# Patient Record
Sex: Female | Born: 1995 | Race: Black or African American | Hispanic: No | Marital: Married | Smoking: Former smoker
Health system: Southern US, Community
[De-identification: ages and names within clinical notes are randomized; demographics above are authoritative.]

## PROBLEM LIST (undated history)

## (undated) ENCOUNTER — Inpatient Hospital Stay (HOSPITAL_COMMUNITY): Payer: Self-pay

## (undated) DIAGNOSIS — D649 Anemia, unspecified: Secondary | ICD-10-CM

## (undated) DIAGNOSIS — Z202 Contact with and (suspected) exposure to infections with a predominantly sexual mode of transmission: Secondary | ICD-10-CM

## (undated) DIAGNOSIS — B9689 Other specified bacterial agents as the cause of diseases classified elsewhere: Secondary | ICD-10-CM

## (undated) DIAGNOSIS — N760 Acute vaginitis: Secondary | ICD-10-CM

## (undated) DIAGNOSIS — K219 Gastro-esophageal reflux disease without esophagitis: Secondary | ICD-10-CM

## (undated) DIAGNOSIS — R519 Headache, unspecified: Secondary | ICD-10-CM

## (undated) DIAGNOSIS — F419 Anxiety disorder, unspecified: Secondary | ICD-10-CM

## (undated) HISTORY — PX: HERNIA REPAIR: SHX51

---
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
## 2013-01-02 NOTE — Progress Notes (Signed)
Post discharge chart review completed.  

## 2013-01-04 ENCOUNTER — Ambulatory Visit (HOSPITAL_COMMUNITY)
Admit: 2013-01-04 | Discharge: 2013-01-04 | Disposition: A | Discharge status: Home / Self Care | Payer: Medicaid Other | Attending: Obstetrics and Gynecology | Admitting: Obstetrics and Gynecology

## 2013-01-04 NOTE — Lactation Note (Signed)
Adult Lactation Consultation Outpatient Visit Note  Patient Name: Lori Lam    BABY: JAMAR HAWLEY-Mcnease Date of Birth: Mar 21, 1996                         DOB: [birth year] Gestational Age at Delivery: 85.4         BIRTH WEIGHT: 6-12.4 Type of Delivery: NVD                          DISCHARGE WEIGHT: 6-7.9                                                               WEIGHT TODAY: 7-1.9 Breastfeeding History: Frequency of Breastfeeding: EVERY 1-3 HOURS Length of Feeding: 20-40 MINUTES Voids: 6+ Stools: 6+ YELLOW  Supplementing / Method:NONE Pumping:  Type of Pump:MANUAL   Frequency:1-2 TIMES PER DAY FOR COMFORT ONLY  Volume:  2 OZ  Comments:    Consultation Evaluation:  Mom and 8 day old newborn here for follow up feeding assist.  Mom was seen 12/31/12 in MAU for engorgement and sore nipples.  Engorgement is resolved.  Both breasts full but soft today.  Right nipple is cracked, left nipple intact.  Mom c/o pain on right side with feedings.  Assisted with 24 mm nipple shield on right breast due to intensity of pain.  Baby latched deeply and nursed actively with audible gulping.  Nipple shield full of milk when baby came off.  Reviewed basics and answered questions.  Mom will call us if any further concerns.  Initial Feeding Assessment: Pre-feed Weight: Post-feed Weight: Amount Transferred: Comments:  Additional Feeding Assessment: Pre-feed Weight: Post-feed Weight: Amount Transferred: Comments:  Additional Feeding Assessment: Pre-feed Weight: Post-feed Weight: Amount Transferred: Comments:  Total Breast milk Transferred this Visit:  Total Supplement Given:   Additional Interventions:   Follow-Up  WILL CALL PRN: PEDIATRICIAN 01/15/13      Hansel Feinstein 01/04/2013, 10:09 AM

## 2013-07-08 ENCOUNTER — Encounter (HOSPITAL_COMMUNITY): Payer: Self-pay | Admitting: Emergency Medicine

## 2013-07-08 ENCOUNTER — Emergency Department (HOSPITAL_COMMUNITY)
Admission: EM | Admit: 2013-07-08 | Discharge: 2013-07-08 | Disposition: A | Discharge status: Home / Self Care | Payer: Medicaid Other | Attending: Emergency Medicine | Admitting: Emergency Medicine

## 2013-07-08 DIAGNOSIS — Z79899 Other long term (current) drug therapy: Secondary | ICD-10-CM | POA: Insufficient documentation

## 2013-07-08 DIAGNOSIS — H5789 Other specified disorders of eye and adnexa: Secondary | ICD-10-CM | POA: Insufficient documentation

## 2013-07-08 DIAGNOSIS — H109 Unspecified conjunctivitis: Secondary | ICD-10-CM

## 2013-07-08 DIAGNOSIS — J45909 Unspecified asthma, uncomplicated: Secondary | ICD-10-CM | POA: Insufficient documentation

## 2013-07-08 DIAGNOSIS — R059 Cough, unspecified: Secondary | ICD-10-CM | POA: Insufficient documentation

## 2013-07-08 DIAGNOSIS — Z87891 Personal history of nicotine dependence: Secondary | ICD-10-CM | POA: Insufficient documentation

## 2013-07-08 DIAGNOSIS — J3489 Other specified disorders of nose and nasal sinuses: Secondary | ICD-10-CM | POA: Insufficient documentation

## 2013-07-08 DIAGNOSIS — R05 Cough: Secondary | ICD-10-CM | POA: Insufficient documentation

## 2013-07-08 MED ORDER — POLYMYXIN B-TRIMETHOPRIM 10000-0.1 UNIT/ML-% OP SOLN: 1.0000 [drp] | OPHTHALMIC | Status: DC

## 2013-07-08 NOTE — Discharge Instructions (Signed)

## 2013-07-08 NOTE — ED Provider Notes (Signed)
CSN: [id number]     Arrival date & time 07/08/13  2244 History   First MD Initiated Contact with Patient 07/08/13 2303     Chief Complaint  Patient presents with  . Conjunctivitis   HPI  History provided by the patient. Patient is 18 year old female with no significant PMH presents with concerns of pinkeye. She also presents with her young infant daughter with similar symptoms. She states over the past week or more she was having irritations to her right eye with some redness and occasional drainage. She does state that it is finally improved much better. She has used some rinsing and washcloths over the area. No other treatments used. Her symptoms were associated with some congestion and slight coughing. Denies any fever, chills or sweats. No other associated symptoms. No other aggravating or alleviating factors.    Past Medical History  Diagnosis Date  . Asthma    Past Surgical History  Procedure Laterality Date  . Hernia repair     Family History  Problem Relation Age of Onset  . Hypertension Other   . Diabetes Other    History  Substance Use Topics  . Smoking status: Former Smoker    Quit date: 09/26/2012  . Smokeless tobacco: Not on file  . Alcohol Use: No   OB History   Grav Para Term Preterm Abortions TAB SAB Ect Mult Living   1 1 1       1      Review of Systems  Constitutional: Negative for fever, chills and diaphoresis.  HENT: Positive for rhinorrhea.   Eyes: Positive for discharge and redness.  Respiratory: Positive for cough.   Gastrointestinal: Negative for vomiting and diarrhea.  All other systems reviewed and are negative.      Allergies  Review of patient's allergies indicates no known allergies.  Home Medications   Current Outpatient Rx  Name  Route  Sig  Dispense  Refill  . albuterol (PROVENTIL HFA;VENTOLIN HFA) 108 (90 BASE) MCG/ACT inhaler   Inhalation   Inhale 2 puffs into the lungs every 6 (six) hours as needed.         Marland Kitchen ibuprofen  (ADVIL,MOTRIN) 600 MG tablet   Oral   Take 1 tablet (600 mg total) by mouth every 6 (six) hours as needed for pain.   30 tablet   1   . Prenatal Vit-Fe Fumarate-FA (PRENATAL MULTIVITAMIN) TABS tablet   Oral   Take 1 tablet by mouth daily at 12 noon.         . trimethoprim-polymyxin b (POLYTRIM) ophthalmic solution   Right Eye   Place 1 drop into the right eye every 4 (four) hours.   10 mL   0    BP 116/70  Pulse 84  Temp(Src) 98.4 F (36.9 C) (Oral)  Resp 16  Ht 5\' 4"  (1.626 m)  SpO2 95%  LMP 05/02/2013  Breastfeeding? No Physical Exam  Nursing note and vitals reviewed. Constitutional: She is oriented to person, place, and time. She appears well-developed and well-nourished. No distress.  HENT:  Head: Normocephalic.  Mouth/Throat: Oropharynx is clear and moist.  Eyes: EOM are normal. Pupils are equal, round, and reactive to light.  Mild redness to the right conjunctiva. There is no discharge.  Neck: Normal range of motion. Neck supple.  Cardiovascular: Normal rate and regular rhythm.   Pulmonary/Chest: Effort normal and breath sounds normal. No respiratory distress. She has no wheezes. She has no rales.  Abdominal: Soft.  Neurological: She is alert  and oriented to person, place, and time.  Skin: Skin is warm and dry. No rash noted.  Psychiatric: She has a normal mood and affect. Her behavior is normal.    ED Course  Procedures    Patient seen and evaluated. Her symptoms have been improving however she presents with her young infant daughter with similar symptoms. Will provide antibiotic eyedrops to be sure of complete resolution. She agrees with plan.     MDM   Final diagnoses:  Conjunctivitis of right eye       Angus Sellereter S Amber Williard, PA-C 07/09/13 2332

## 2013-07-08 NOTE — ED Notes (Signed)
Patient with pink eye for last two weeks.  Patient states she has been using drops in right eye.  The symptoms come and go.  Patient states her son also has it.

## 2013-07-13 NOTE — ED Provider Notes (Signed)
Medical screening examination/treatment/procedure(s) were performed by non-physician practitioner and as supervising physician I was immediately available for consultation/collaboration.   EKG Interpretation None       Iyari Hagner R. Nikitas Davtyan, MD 07/13/13 0825 

## 2013-07-25 ENCOUNTER — Emergency Department (HOSPITAL_COMMUNITY)
Admission: EM | Admit: 2013-07-25 | Discharge: 2013-07-25 | Disposition: A | Discharge status: Home / Self Care | Payer: Medicaid Other | Attending: Emergency Medicine | Admitting: Emergency Medicine

## 2013-07-25 ENCOUNTER — Encounter (HOSPITAL_COMMUNITY): Payer: Self-pay | Admitting: Emergency Medicine

## 2013-07-25 DIAGNOSIS — L089 Local infection of the skin and subcutaneous tissue, unspecified: Secondary | ICD-10-CM | POA: Insufficient documentation

## 2013-07-25 DIAGNOSIS — J45909 Unspecified asthma, uncomplicated: Secondary | ICD-10-CM | POA: Insufficient documentation

## 2013-07-25 DIAGNOSIS — Z3202 Encounter for pregnancy test, result negative: Secondary | ICD-10-CM | POA: Insufficient documentation

## 2013-07-25 DIAGNOSIS — Z87891 Personal history of nicotine dependence: Secondary | ICD-10-CM | POA: Insufficient documentation

## 2013-07-25 DIAGNOSIS — Z79899 Other long term (current) drug therapy: Secondary | ICD-10-CM | POA: Insufficient documentation

## 2013-07-25 LAB — POC URINE PREG, ED: PREG TEST UR: NEGATIVE

## 2013-07-25 MED ORDER — DOXYCYCLINE HYCLATE 100 MG PO CAPS: 100.0000 mg | ORAL_CAPSULE | Freq: Two times a day (BID) | ORAL | Status: DC

## 2013-07-25 NOTE — Discharge Instructions (Signed)
Take the prescribed medication as directed.  Use warm compresses at home to help aid healing. Follow-up with your primary care physician.   Emergency Department Resource Guide 1) Find a Doctor and Pay Out of Pocket Although you won't have to find out who is covered by your insurance plan, it is a good idea to ask around and get recommendations. You will then need to call the office and see if the doctor you have chosen will accept you as a new patient and what types of options they offer for patients who are self-pay. Some doctors offer discounts or will set up payment plans for their patients who do not have insurance, but you will need to ask so you aren't surprised when you get to your appointment.  2) Contact Your Local Health Department Not all health departments have doctors that can see patients for sick visits, but many do, so it is worth a call to see if yours does. If you don't know where your local health department is, you can check in your phone book. The CDC also has a tool to help you locate your state's health department, and many state websites also have listings of all of their local health departments.  3) Find a Walk-in Clinic If your illness is not likely to be very severe or complicated, you may want to try a walk in clinic. These are popping up all over the country in pharmacies, drugstores, and shopping centers. They're usually staffed by nurse practitioners or physician assistants that have been trained to treat common illnesses and complaints. They're usually fairly quick and inexpensive. However, if you have serious medical issues or chronic medical problems, these are probably not your best option.  No Primary Care Doctor: - Call Health Connect at  [phone] - they can help you locate a primary care doctor that  accepts your insurance, provides certain services, etc. - Physician Referral Service- [phone]  Chronic Pain Problems: Organization          Address  Phone   Notes  [facility]  [phone] Patients need to be referred by their primary care doctor.   Medication Assistance: Organization         Address  Phone   Notes  [facility] [address] [place], [place] [address] [phone] --Must be a resident of [place] [facility] [place] -- Must have NO insurance coverage whatsoever (no Medicaid/ Medicare, etc.) -- The pt. MUST have a primary care doctor that directs their care regularly and follows them in the community   MedAssist  [phone]   [employer]  [phone]    Agencies that provide inexpensive medical care: Organization         Address  Phone   Notes  [facility]  [phone]   [facility]    [phone]   [facility] [address] [place], [place] [address] [phone]   [facility] [address], [place] [phone]   [facility]anned Parenthood    [phone]   [facility]    [phone]   [facility]  [address], [place] Phone:  [phone], Fax:  [phone] Hours of Operation:  9 am - 6 pm, M-F.  Also accepts Medicaid/Medicare and self-pay.  [facility]  [address], [place] Phone: [phone],  Fax: [phone]. Hours of Operation:  8:30 am - 5:30 pm, M-F.  Also accepts Medicaid and self-pay.  [facility] [place] [address], [place] Phone: [phone]   [place], [place], [place] [phone], Ext. [phone] Mondays & Thursdays: 7-9 AM.  First 15 patients are seen on a first come, first serve basis.    [place] Providers:  Organization         Address  Phone   Notes  [place] [address], [place] [phone] Also accepts self-pay patients.  [facility] [address], [place]  [phone]   [place], [address], [place] [phone]   [facility] [address] [place] [phone]   Lucianne Lei [address], [place]   [phone] Only accepts [place] Access [place] patients after they have their name applied to their card.   Self-Pay (no insurance) in [facility]:  Organization         Address  Phone   Notes  Sickle Cell Patients, [facility] [place] [phone]   [facility] [place] [phone]   [facility] [place]  [place], [place], [place] [phone]   [facility]/Dr. Osei-Bonsu  [address], [place] or [address], [place] [phone] Phone number for both [place] and [place] locations is the same.  [facility] [address], [place] [phone]   [facility] [address] [place] or [address] [phone]   [facility] [address] [place] [phone], phone; [phone], fax Sees patients 1st and 3rd Saturday of every month.  Must not qualify for public or private insurance (i.e. Medicaid, Medicare, [place] Health Choice, Veterans' Benefits)  Household income should be no more than 200% of the poverty level The clinic cannot treat you if you are pregnant or think you are pregnant  Sexually transmitted diseases are not treated at the clinic.    Dental Care: Organization         Address  Phone  Notes  [facility] [place] [phone] Accepts children up to age 36 who are enrolled in [place] or [place]; pregnant women with a Medicaid card; and  children who have applied for Medicaid or [place] Health Choice, but were declined, whose parents can pay a reduced fee at time of service.  [facility] Department of [facility]  [address], [place] [phone] Accepts children up to age 67 who are enrolled in [place] or [place]; pregnant women with a Medicaid card; and children who have applied for Medicaid or [place] Health Choice, but were declined, whose parents can pay a reduced fee at time of service.  [place] [facility] Access PROGRAM  [place] [phone] Patients are seen by appointment only. Walk-ins are not accepted. [place] will see patients 26 years of age and older. Monday - Tuesday (8am-5pm) Most Wednesdays (8:30-5pm) $30 per visit, cash only  [facility] [facility] [employer] [facility]  [address], [employer] (  [phone] Patients are seen by appointment only. Walk-ins are not accepted. [place] will see patients 54 years of age and older. One Wednesday Evening (Monthly: Volunteer Based).  $30 per visit, cash only  [place]  [phone] for adults; Children under age 63, call Graduate Pediatric Dentistry at [phone]. Children aged 44-14, please call [phone] to request a pediatric application.  Dental services are provided in all areas of dental care including fillings, crowns and bridges, complete and partial dentures, implants, gum treatment, root canals, and extractions. Preventive care is also provided. Treatment is provided to both adults and children. Patients are selected via a lottery and there is often a waiting list.   [facility] [address] [place]  [phone] [email or web address]   [facility] [address] [place], [place] [phone], Ext. [phone] Second and Fourth Thursday of each month, opens at 6:30 AM; Clinic ends at 9 AM.  Patients are seen on a first-come first-served  basis, and a limited number are seen during each clinic.   [facility]  [address] Hillard Danker [place], [place] [phone]   Eligibility Requirements You must have lived in [place], [place], or [place] counties for at least the last three months.   You cannot be eligible for state or federal sponsored [employer], including [employer], [place], or [employer].   You generally cannot be eligible for healthcare insurance through your employer.    How to apply: Eligibility screenings are held every Tuesday and Wednesday afternoon from 1:00 pm until 4:00 pm. You do not need an appointment for the interview!  [facility] [address], [place], [place]   [place]  [place] Department  [place]  [phone]    Behavioral Health Resources in the Community: Intensive Outpatient Programs Organization         Address  Phone  Notes  [place]. [address], [place], [place] [phone]   [facility] [address] [place], [place]   ADS: [facility] [address], [place], [place]   [place] [address]  [place], [place] or [phone]   Substance Abuse Resources Organization         Address  Phone  Notes  [facility] Drug Services  [phone]   [place]  [phone]   [facility] [place]   Chinita Pester  [phone]   Residential & Outpatient Substance Abuse Program  [phone]   Psychological Services Organization         Address  Phone  Notes  [facility] [place]  [place]  [phone]   [place] [address], [place] or [phone]    Mobile Crisis Teams Organization          Address  Phone  Notes  [facility], Mobile Crisis Care Unit  [phone]   [facility]  [address] [place], [place]   [place] [address] [place] [phone]    Self-Help/Support Groups Organization         Address  Phone             Notes  [place]. of [place] - variety of support groups  [phone]- [phone] Call for more information  °Narcotics Anonymous (NA), [facility] [address], °[place]  2 meetings at this location  ° °Residential Treatment Programs °Organization         Address  Phone  Notes  °[facility] [address],    °[place]  [phone]   °[facility] ° [address], [place], [place] [phone]   °[facility] [address], [place] [phone] Admissions: 8am-3pm M-F  °[facility] [address]    °[place], [place] [phone]   °[facility] [address], [place], [place] [phone]   °[facility] [address]  °[place], [place] [phone]   °[facility] - Intensive Outpatient [address], [place], [place] [phone]   °[facility] [address]  °[place], [place] 1-877-615-2722 or [phone]   °[facility] ([facility]) [address] [place], [place] [phone] Accepts Medicaid  °[facility] [address]  °[place] 1-800-659-3381 Substance Abuse/Addiction Treatment  ° °[place] Behavioral Health Resources °Organization         Address  Phone  Notes  °[facility]  (888) [phone]   °Julie Brannon, PhD [address] [place], [place]   ([phone]) [phone] or (336) [phone]   °[facility]   [address] °[place], [place] ([phone]) [phone]   °[facility] [address], [place], [place] ([phone]) [phone] Insurance/Medicaid/sponsorship  through [facility]  °[facility] [address]                                    [place], [place] [phone]) [phone] Therapy/tele-psych/case  °[facility] [address]  ° [place], [place] (336) [phone]    °Dr. Arfeen  ([phone]) [phone]   °[facility] [place]  [employer] [place] [facility] 1) [address], [place] °2) [address], [place] °3)  [address], [place] (336) [phone] °(336) [phone] ° °(336) [phone]   °[place] Child Abuse Hotline (336) 342-1394 or (336) [phone] (After Hours)    ° ° ° °

## 2013-07-25 NOTE — ED Notes (Signed)
Pt. reports infected tattoo at right deltoid with swelling onset last Friday . No drainage or fever .

## 2013-07-25 NOTE — ED Provider Notes (Signed)
CSN: [id number]     Arrival date & time 07/25/13  2105 History   This chart was scribed for non-physician practitioner, Sharilyn SitesLisa [place] Brinkley, PA-C, working with Merrie RoofJohn David Wofford III, by Smiley HousemanFallon Davis, ED Scribe. This patient was seen in room TR04C/TR04C and the patient's care was started at 9:32 PM.    Chief Complaint  Patient presents with  . Cellulitis    The history is provided by the patient. No language interpreter was used.   HPI Comments: Lori CzechMaryann A Lam is a 18 y.o. female who presents to the Emergency Department complaining of an infected tattoo at her right deltoid that started about 7 days ago.  She states the area is painful and itches.  Pt states she saw the tattoo artist take the needle out of a new package, so she knows the needle was clean.  She thinks she didn't clean it properly and an infection occurred.  Pt also complains of a possible allergic reaction around her tattoo.  She states small bumps are present on around her tattoo.  Pt denies numbness and tingling to the area.  Pt denies fever and chills.  No topical medications tried PTA.  Pt has Implanon, but states she has feeling pregnant recently.  She reports she knows there is a slight chance she could be pregnant and would like a pregnancy test to be sure.     Past Medical History  Diagnosis Date  . Asthma    Past Surgical History  Procedure Laterality Date  . Hernia repair     Family History  Problem Relation Age of Onset  . Hypertension Other   . Diabetes Other    History  Substance Use Topics  . Smoking status: Former Smoker    Quit date: 09/26/2012  . Smokeless tobacco: Not on file  . Alcohol Use: No   OB History   Grav Para Term Preterm Abortions TAB SAB Ect Mult Living   1 1 1       1      Review of Systems  Constitutional: Negative for fever and chills.  Gastrointestinal: Negative for nausea and vomiting.  Skin: Positive for color change.  All other systems reviewed and are  negative.   Allergies  Review of patient's allergies indicates no known allergies.  Home Medications   Current Outpatient Rx  Name  Route  Sig  Dispense  Refill  . albuterol (PROVENTIL HFA;VENTOLIN HFA) 108 (90 BASE) MCG/ACT inhaler   Inhalation   Inhale 2 puffs into the lungs every 6 (six) hours as needed.          Triage Vitals: BP 124/69  Pulse 97  Temp(Src) 99.3 F (37.4 C) (Oral)  Resp 24  Ht 5\' 4"  (1.626 m)  Wt 193 lb (87.544 kg)  BMI 33.11 kg/m2  SpO2 99%  LMP 07/17/2013  Physical Exam  Nursing note and vitals reviewed. Constitutional: She is oriented to person, place, and time. She appears well-developed and well-nourished. No distress.  HENT:  Head: Normocephalic and atraumatic.  Eyes: EOM are normal.  Neck: Neck supple. No tracheal deviation present.  Cardiovascular: Normal rate.   Pulmonary/Chest: Effort normal. No respiratory distress.  Musculoskeletal: Normal range of motion.  Neurological: She is alert and oriented to person, place, and time.  Skin: Skin is warm and dry. No bruising, no ecchymosis and no laceration noted. She is not diaphoretic. There is erythema. No pallor.  Large tattoo of right upper arm with area of cellulitis in middle.  Warmth to  touch without definitive abscess formation.  No drainage or central fluctuance.  Some scaling around outer margins of tattoo.    Psychiatric: She has a normal mood and affect. Her behavior is normal.    ED Course  Procedures (including critical care time) DIAGNOSTIC STUDIES: Oxygen Saturation is 99% on RA, normal by my interpretation.    COORDINATION OF CARE: 9:41 PM-Will order urine pregnancy screen.  Patient informed of current plan of treatment and evaluation and agrees with plan.    Results for orders placed during the hospital encounter of 07/25/13  POC URINE PREG, ED      Result Value Ref Range   Preg Test, Ur NEGATIVE  NEGATIVE    MDM   Final diagnoses:  Skin infection   Urine preg  negative.  Pt has large RUE tattoo with patch of cellulitis in middle.  No visible abscess formation, central fluctuance, or drainage.  She is currently afebrile and non-toxic appearing.  Will start on doxycycline, encouraged warm compresses at home.  Discussed plan with pt, they agreed.  Return precautions advised.  I personally performed the services described in this documentation, which was scribed in my presence. The recorded information has been reviewed and is accurate.  Garlon Hatchet, PA-C 07/25/13 2216

## 2013-07-28 NOTE — ED Provider Notes (Signed)
Medical screening examination/treatment/procedure(s) were performed by non-physician practitioner and as supervising physician I was immediately available for consultation/collaboration.   EKG Interpretation None        Candyce ChurnJohn David Daxen Lanum III, MD 07/28/13 1249

## 2014-03-03 ENCOUNTER — Encounter (HOSPITAL_COMMUNITY): Payer: Self-pay | Admitting: Emergency Medicine

## 2014-07-26 ENCOUNTER — Encounter (HOSPITAL_COMMUNITY): Payer: Self-pay | Admitting: *Deleted

## 2014-07-26 DIAGNOSIS — J45909 Unspecified asthma, uncomplicated: Secondary | ICD-10-CM | POA: Diagnosis not present

## 2014-07-26 DIAGNOSIS — H9202 Otalgia, left ear: Secondary | ICD-10-CM | POA: Insufficient documentation

## 2014-07-26 NOTE — ED Notes (Signed)
Lt ear pain for 2 days.  No known injury.  Pain with sl swelling

## 2014-07-27 ENCOUNTER — Emergency Department (HOSPITAL_COMMUNITY)
Admission: EM | Admit: 2014-07-27 | Discharge: 2014-07-27 | Discharge status: Against Medical Advice | Payer: Medicaid Other | Attending: Emergency Medicine | Admitting: Emergency Medicine

## 2014-07-27 NOTE — ED Notes (Signed)
Pt left AMA.  Called multiple times no answer.

## 2015-03-11 ENCOUNTER — Emergency Department (HOSPITAL_COMMUNITY)
Admission: EM | Admit: 2015-03-11 | Discharge: 2015-03-11 | Disposition: A | Discharge status: Home / Self Care | Payer: Medicaid Other | Attending: Emergency Medicine | Admitting: Emergency Medicine

## 2015-03-11 ENCOUNTER — Encounter (HOSPITAL_COMMUNITY): Payer: Self-pay | Admitting: *Deleted

## 2015-03-11 DIAGNOSIS — Z79899 Other long term (current) drug therapy: Secondary | ICD-10-CM | POA: Insufficient documentation

## 2015-03-11 DIAGNOSIS — S199XXA Unspecified injury of neck, initial encounter: Secondary | ICD-10-CM | POA: Diagnosis not present

## 2015-03-11 DIAGNOSIS — S46911A Strain of unspecified muscle, fascia and tendon at shoulder and upper arm level, right arm, initial encounter: Secondary | ICD-10-CM | POA: Diagnosis not present

## 2015-03-11 DIAGNOSIS — J45909 Unspecified asthma, uncomplicated: Secondary | ICD-10-CM | POA: Diagnosis not present

## 2015-03-11 DIAGNOSIS — Y999 Unspecified external cause status: Secondary | ICD-10-CM | POA: Diagnosis not present

## 2015-03-11 DIAGNOSIS — S0990XA Unspecified injury of head, initial encounter: Secondary | ICD-10-CM | POA: Diagnosis not present

## 2015-03-11 DIAGNOSIS — Y92410 Unspecified street and highway as the place of occurrence of the external cause: Secondary | ICD-10-CM | POA: Diagnosis not present

## 2015-03-11 DIAGNOSIS — S46811A Strain of other muscles, fascia and tendons at shoulder and upper arm level, right arm, initial encounter: Secondary | ICD-10-CM

## 2015-03-11 DIAGNOSIS — Y939 Activity, unspecified: Secondary | ICD-10-CM | POA: Diagnosis not present

## 2015-03-11 DIAGNOSIS — Z87891 Personal history of nicotine dependence: Secondary | ICD-10-CM | POA: Diagnosis not present
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
## 2015-05-03 ENCOUNTER — Inpatient Hospital Stay (HOSPITAL_COMMUNITY)
Admission: EM | Admit: 2015-05-03 | Discharge: 2015-05-03 | Disposition: A | Discharge status: Home / Self Care | Payer: Medicaid Other | Source: Ambulatory Visit | Attending: Obstetrics and Gynecology | Admitting: Obstetrics and Gynecology

## 2015-05-03 ENCOUNTER — Encounter (HOSPITAL_COMMUNITY): Payer: Self-pay | Admitting: *Deleted

## 2015-05-03 DIAGNOSIS — N921 Excessive and frequent menstruation with irregular cycle: Secondary | ICD-10-CM

## 2015-05-03 DIAGNOSIS — N939 Abnormal uterine and vaginal bleeding, unspecified: Secondary | ICD-10-CM | POA: Diagnosis present

## 2015-05-03 DIAGNOSIS — J45909 Unspecified asthma, uncomplicated: Secondary | ICD-10-CM | POA: Diagnosis not present

## 2015-05-03 DIAGNOSIS — Z975 Presence of (intrauterine) contraceptive device: Secondary | ICD-10-CM

## 2015-05-03 DIAGNOSIS — F17200 Nicotine dependence, unspecified, uncomplicated: Secondary | ICD-10-CM | POA: Diagnosis not present

## 2015-05-03 DIAGNOSIS — K219 Gastro-esophageal reflux disease without esophagitis: Secondary | ICD-10-CM | POA: Diagnosis not present

## 2015-05-03 HISTORY — DX: Contact with and (suspected) exposure to infections with a predominantly sexual mode of transmission: Z20.2

## 2015-05-03 HISTORY — DX: Gastro-esophageal reflux disease without esophagitis: K21.9

## 2015-05-03 HISTORY — DX: Anemia, unspecified: D64.9

## 2015-05-03 LAB — WET PREP, GENITAL
SPERM: NONE SEEN
Trich, Wet Prep: NONE SEEN
Yeast Wet Prep HPF POC: NONE SEEN

## 2015-05-03 LAB — URINALYSIS, ROUTINE W REFLEX MICROSCOPIC
Bilirubin Urine: NEGATIVE
GLUCOSE, UA: NEGATIVE mg/dL
KETONES UR: NEGATIVE mg/dL
LEUKOCYTES UA: NEGATIVE
NITRITE: NEGATIVE
PROTEIN: NEGATIVE mg/dL
Specific Gravity, Urine: 1.01 (ref 1.005–1.030)
pH: 7.5 (ref 5.0–8.0)

## 2015-05-03 LAB — URINE MICROSCOPIC-ADD ON

## 2015-05-03 LAB — CBC
HCT: 36.7 % (ref 36.0–46.0)
Hemoglobin: 12 g/dL (ref 12.0–15.0)
MCH: 29.4 pg (ref 26.0–34.0)
MCHC: 32.7 g/dL (ref 30.0–36.0)
MCV: 90 fL (ref 78.0–100.0)
PLATELETS: 255 10*3/uL (ref 150–400)
RBC: 4.08 MIL/uL (ref 3.87–5.11)
RDW: 13 % (ref 11.5–15.5)
WBC: 9.7 10*3/uL (ref 4.0–10.5)

## 2015-05-03 LAB — POCT PREGNANCY, URINE: PREG TEST UR: NEGATIVE

## 2015-05-03 MED ORDER — KETOROLAC TROMETHAMINE 60 MG/2ML IM SOLN
60.0000 mg | Freq: Once | INTRAMUSCULAR | Status: AC
  Administered 2015-05-03: 60 mg via INTRAMUSCULAR
  Filled 2015-05-03: qty 2

## 2015-05-03 NOTE — MAU Provider Note (Signed)
History     CSN: [id number]  Arrival date and time: 05/03/15 2018   First Provider Initiated Contact with Patient 05/03/15 2112      Chief Complaint  Patient presents with  . Vaginal Bleeding   HPI Comments: Lori Lam is a 20 y.o. G1P1001 who presents today with vaginal bleeding. She states that she had Nexplanon placed in 01/2013, and she did not have had any bleeding. However, yesterday she started having bleeding and cramps.   Vaginal Bleeding The patient's primary symptoms include vaginal bleeding. This is a new problem. The current episode started yesterday. The problem occurs constantly. The problem has been unchanged. The pain is severe (8/10 ). The problem affects both sides. She is not pregnant. Associated symptoms include abdominal pain and nausea. Pertinent negatives include no chills, constipation, diarrhea, dysuria, fever, frequency, urgency or vomiting. The vaginal bleeding is typical of menses. Nothing aggravates the symptoms. She has tried nothing for the symptoms. She is sexually active. It is unknown whether or not her partner has an STD. Contraceptive use: Nexplanon  Her menstrual history has been irregular.     Past Medical History  Diagnosis Date  . Asthma   . GERD (gastroesophageal reflux disease)   . Chlamydia contact, treated   . Anemia     Past Surgical History  Procedure Laterality Date  . Hernia repair      Family History  Problem Relation Age of Onset  . Hypertension Other   . Diabetes Other     Social History  Substance Use Topics  . Smoking status: Current Every Day Smoker  . Smokeless tobacco: None  . Alcohol Use: Yes     Comment: socially    Allergies: No Known Allergies  Prescriptions prior to admission  Medication Sig Dispense Refill Last Dose  . etonogestrel (NEXPLANON) 68 MG IMPL implant 1 each by Subdermal route once.   01/30/2013 at unknown  . ibuprofen (ADVIL,MOTRIN) 200 MG tablet Take 600 mg by mouth every 6 (six)  hours as needed for headache or mild pain.   Past Month at Unknown time    Review of Systems  Constitutional: Negative for fever and chills.  Gastrointestinal: Positive for nausea and abdominal pain. Negative for vomiting, diarrhea and constipation.  Genitourinary: Positive for vaginal bleeding. Negative for dysuria, urgency and frequency.   Physical Exam   There were no vitals taken for this visit.  Physical Exam  Nursing note and vitals reviewed. Constitutional: She is oriented to person, place, and time. She appears well-developed and well-nourished. No distress.  HENT:  Head: Normocephalic.  Cardiovascular: Normal rate.   Respiratory: Effort normal.  GI: Soft. There is no tenderness. There is no rebound.  Genitourinary:   External: no lesion Vagina: small amount of blood seen  Cervix: pink, smooth, no CMT Uterus: NSSC Adnexa: NT   Neurological: She is alert and oriented to person, place, and time.  Skin: Skin is warm and dry.  Psychiatric: She has a normal mood and affect.   Results for orders placed or performed during the hospital encounter of 05/03/15 (from the past 24 hour(s))  Urinalysis, Routine w reflex microscopic (not at [facility])     Status: Abnormal   Collection Time: 05/03/15  8:30 PM  Result Value Ref Range   Color, Urine YELLOW YELLOW   APPearance CLEAR CLEAR   Specific Gravity, Urine 1.010 1.005 - 1.030   pH 7.5 5.0 - 8.0   Glucose, UA NEGATIVE NEGATIVE mg/dL   Hgb urine  dipstick LARGE (A) NEGATIVE   Bilirubin Urine NEGATIVE NEGATIVE   Ketones, ur NEGATIVE NEGATIVE mg/dL   Protein, ur NEGATIVE NEGATIVE mg/dL   Nitrite NEGATIVE NEGATIVE   Leukocytes, UA NEGATIVE NEGATIVE  Urine microscopic-add on     Status: Abnormal   Collection Time: 05/03/15  8:30 PM  Result Value Ref Range   Squamous Epithelial / LPF 0-5 (A) NONE SEEN   WBC, UA 0-5 0 - 5 WBC/hpf   RBC / HPF 0-5 0 - 5 RBC/hpf   Bacteria, UA RARE (A) NONE SEEN  Pregnancy, urine POC     Status:  None   Collection Time: 05/03/15  8:44 PM  Result Value Ref Range   Preg Test, Ur NEGATIVE NEGATIVE  CBC     Status: None   Collection Time: 05/03/15  9:30 PM  Result Value Ref Range   WBC 9.7 4.0 - 10.5 K/uL   RBC 4.08 3.87 - 5.11 MIL/uL   Hemoglobin 12.0 12.0 - 15.0 g/dL   HCT 11.936.7 14.736.0 - 82.946.0 %   MCV 90.0 78.0 - 100.0 fL   MCH 29.4 26.0 - 34.0 pg   MCHC 32.7 30.0 - 36.0 g/dL   RDW 56.213.0 13.011.5 - 86.515.5 %   Platelets 255 150 - 400 K/uL  Wet prep, genital     Status: Abnormal   Collection Time: 05/03/15 10:00 PM  Result Value Ref Range   Yeast Wet Prep HPF POC NONE SEEN NONE SEEN   Trich, Wet Prep NONE SEEN NONE SEEN   Clue Cells Wet Prep HPF POC PRESENT (A) NONE SEEN   WBC, Wet Prep HPF POC FEW (A) NONE SEEN   Sperm NONE SEEN     MAU Course  Procedures  MDM Patient has had toradol. Pain is improved.   Assessment and Plan   1. Breakthrough bleeding on Nexplanon    DC home Comfort measures reviewed   Bleeding precautions RX: none  Return to MAU as needed   Follow-up Information    [facility].   Specialty:  Obstetrics and Gynecology   Why:  As needed   Contact information:   [address] [place] [phone]        Tawnya CrookHogan, Parlee Amescua Donovan 05/03/2015, 10:35 PM

## 2015-05-03 NOTE — MAU Note (Signed)
Not in lobby

## 2015-05-03 NOTE — Discharge Instructions (Signed)
Etonogestrel implant What is this medicine? ETONOGESTREL (et oh noe JES trel) is a contraceptive (birth control) device. It is used to prevent pregnancy. It can be used for up to 3 years. This medicine may be used for other purposes; ask your health care provider or pharmacist if you have questions. What should I tell my health care provider before I take this medicine? They need to know if you have any of these conditions: -abnormal vaginal bleeding -blood vessel disease or blood clots -cancer of the breast, cervix, or liver -depression -diabetes -gallbladder disease -headaches -heart disease or recent heart attack -high blood pressure -high cholesterol -kidney disease -liver disease -renal disease -seizures -tobacco smoker -an unusual or allergic reaction to etonogestrel, other hormones, anesthetics or antiseptics, medicines, foods, dyes, or preservatives -pregnant or trying to get pregnant -breast-feeding How should I use this medicine? This device is inserted just under the skin on the inner side of your upper arm by a health care professional. Talk to your pediatrician regarding the use of this medicine in children. Special care may be needed. Overdosage: If you think you have taken too much of this medicine contact a poison control center or emergency room at once. NOTE: This medicine is only for you. Do not share this medicine with others. What if I miss a dose? This does not apply. What may interact with this medicine? Do not take this medicine with any of the following medications: -amprenavir -bosentan -fosamprenavir This medicine may also interact with the following medications: -barbiturate medicines for inducing sleep or treating seizures -certain medicines for fungal infections like ketoconazole and itraconazole -griseofulvin -medicines to treat seizures like carbamazepine, felbamate, oxcarbazepine, phenytoin,  topiramate -modafinil -phenylbutazone -rifampin -some medicines to treat HIV infection like atazanavir, indinavir, lopinavir, nelfinavir, tipranavir, ritonavir -St. John's wort This list may not describe all possible interactions. Give your health care provider a list of all the medicines, herbs, non-prescription drugs, or dietary supplements you use. Also tell them if you smoke, drink alcohol, or use illegal drugs. Some items may interact with your medicine. What should I watch for while using this medicine? This product does not protect you against HIV infection (AIDS) or other sexually transmitted diseases. You should be able to feel the implant by pressing your fingertips over the skin where it was inserted. Contact your doctor if you cannot feel the implant, and use a non-hormonal birth control method (such as condoms) until your doctor confirms that the implant is in place. If you feel that the implant may have broken or become bent while in your arm, contact your healthcare provider. What side effects may I notice from receiving this medicine? Side effects that you should report to your doctor or health care professional as soon as possible: -allergic reactions like skin rash, itching or hives, swelling of the face, lips, or tongue -breast lumps -changes in emotions or moods -depressed mood -heavy or prolonged menstrual bleeding -pain, irritation, swelling, or bruising at the insertion site -scar at site of insertion -signs of infection at the insertion site such as fever, and skin redness, pain or discharge -signs of pregnancy -signs and symptoms of a blood clot such as breathing problems; changes in vision; chest pain; severe, sudden headache; pain, swelling, warmth in the leg; trouble speaking; sudden numbness or weakness of the face, arm or leg -signs and symptoms of liver injury like dark yellow or brown urine; general ill feeling or flu-like symptoms; light-colored stools; loss of  appetite; nausea; right upper belly   pain; unusually weak or tired; yellowing of the eyes or skin -unusual vaginal bleeding, discharge -signs and symptoms of a stroke like changes in vision; confusion; trouble speaking or understanding; severe headaches; sudden numbness or weakness of the face, arm or leg; trouble walking; dizziness; loss of balance or coordination Side effects that usually do not require medical attention (Report these to your doctor or health care professional if they continue or are bothersome.): -acne -back pain -breast pain -changes in weight -dizziness -general ill feeling or flu-like symptoms -headache -irregular menstrual bleeding -nausea -sore throat -vaginal irritation or inflammation This list may not describe all possible side effects. Call your doctor for medical advice about side effects. You may report side effects to FDA at 1-800-FDA-1088. Where should I keep my medicine? This drug is given in a hospital or clinic and will not be stored at home. NOTE: This sheet is a summary. It may not cover all possible information. If you have questions about this medicine, talk to your doctor, pharmacist, or health care provider.    2016, Elsevier/Gold Standard. (2014-01-31 14:07:06)  

## 2015-05-03 NOTE — MAU Note (Signed)
Pt had nexplanon placed two years ago and states she hasnt had a period for that length of time. Yesterday she started with spotting and then it became more moderate for a couple hours and now just spotting again. She had a neg HPT 2 weeks ago.  Came in tonight because she was scared and concerned about the unusual bleeding.

## 2015-05-05 LAB — GC/CHLAMYDIA PROBE AMP (~~LOC~~) NOT AT ARMC
Chlamydia: NEGATIVE
NEISSERIA GONORRHEA: NEGATIVE

## 2015-05-05 LAB — HIV ANTIBODY (ROUTINE TESTING W REFLEX): HIV Screen 4th Generation wRfx: NONREACTIVE

## 2015-05-05 LAB — RPR: RPR: NONREACTIVE

## 2015-09-22 ENCOUNTER — Ambulatory Visit (HOSPITAL_COMMUNITY)
Admission: EM | Admit: 2015-09-22 | Discharge: 2015-09-22 | Disposition: A | Discharge status: Home / Self Care | Payer: Medicaid Other | Attending: Family Medicine | Admitting: Family Medicine

## 2015-09-22 ENCOUNTER — Encounter (HOSPITAL_COMMUNITY): Payer: Self-pay

## 2015-09-22 DIAGNOSIS — Z8249 Family history of ischemic heart disease and other diseases of the circulatory system: Secondary | ICD-10-CM | POA: Insufficient documentation

## 2015-09-22 DIAGNOSIS — F17210 Nicotine dependence, cigarettes, uncomplicated: Secondary | ICD-10-CM | POA: Insufficient documentation

## 2015-09-22 DIAGNOSIS — Z3202 Encounter for pregnancy test, result negative: Secondary | ICD-10-CM

## 2015-09-22 DIAGNOSIS — J45909 Unspecified asthma, uncomplicated: Secondary | ICD-10-CM | POA: Diagnosis not present

## 2015-09-22 DIAGNOSIS — Z833 Family history of diabetes mellitus: Secondary | ICD-10-CM | POA: Diagnosis not present

## 2015-09-22 DIAGNOSIS — Z202 Contact with and (suspected) exposure to infections with a predominantly sexual mode of transmission: Secondary | ICD-10-CM | POA: Diagnosis not present

## 2015-09-22 DIAGNOSIS — Z711 Person with feared health complaint in whom no diagnosis is made: Secondary | ICD-10-CM

## 2015-09-22 DIAGNOSIS — K219 Gastro-esophageal reflux disease without esophagitis: Secondary | ICD-10-CM | POA: Insufficient documentation

## 2015-09-22 LAB — POCT PREGNANCY, URINE: Preg Test, Ur: NEGATIVE

## 2015-09-22 NOTE — ED Provider Notes (Signed)
CSN: [id number]     Arrival date & time 09/22/15  1743 History   First MD Initiated Contact with Patient 09/22/15 1811     Chief Complaint  Patient presents with  . Exposure to STD  . Possible Pregnancy   (Consider location/radiation/quality/duration/timing/severity/associated sxs/prior Treatment) HPI Pt is advised partner disclosed confirmation of STD testing and has suggested patient be treated. Pt denies any symptoms at this time. States that she has no idea what her partner has. Denies pain, or other symptoms at this time   Denies: fever chills, previous STD   Family history positive for diabetes and hypertension Past Medical History  Diagnosis Date  . Asthma   . GERD (gastroesophageal reflux disease)   . Chlamydia contact, treated   . Anemia    Past Surgical History  Procedure Laterality Date  . Hernia repair     Family History  Problem Relation Age of Onset  . Hypertension Other   . Diabetes Other    Social History  Substance Use Topics  . Smoking status: Current Every Day Smoker -- 0.50 packs/day    Types: Cigarettes  . Smokeless tobacco: Never Used  . Alcohol Use: Yes     Comment: socially   OB History    Gravida Para Term Preterm AB TAB SAB Ectopic Multiple Living   Review of Systems  Denies: HEADACHE, NAUSEA, ABDOMINAL PAIN, CHEST PAIN, CONGESTION, DYSURIA, SHORTNESS OF BREATH  Allergies  Review of patient's allergies indicates no known allergies.  Home Medications   Prior to Admission medications   Medication Sig Start Date End Date Taking? Authorizing Provider  etonogestrel (NEXPLANON) 68 MG IMPL implant 1 each by Subdermal route once.    Historical Provider, MD  ibuprofen (ADVIL,MOTRIN) 200 MG tablet Take 600 mg by mouth every 6 (six) hours as needed for headache or mild pain.    Historical Provider, MD   Meds Ordered and Administered this Visit  Medications - No data to display  BP 126/74 mmHg  Pulse 64  Temp(Src) 98.6 F  (37 C) (Oral)  Resp 16  SpO2 100%  LMP 09/05/2015 No data found.   Physical Exam NURSES NOTES AND VITAL SIGNS REVIEWED. CONSTITUTIONAL: Well developed, well nourished, no acute distress HEENT: normocephalic, atraumatic EYES: Conjunctiva normal NECK:normal ROM, supple, no adenopathy PULMONARY:No respiratory distress, normal effort ABDOMINAL: Soft, ND, NT BS+, No CVAT MUSCULOSKELETAL: Normal ROM of all extremities,  SKIN: warm and dry without rash PSYCHIATRIC: Mood and affect, behavior are normal  ED Course  Procedures (including critical care time)  Labs Review Labs Reviewed  HIV ANTIBODY (ROUTINE TESTING)  URINE CYTOLOGY ANCILLARY ONLY   Would like to wait until culture results have returned to commence treatment. Imaging Review No results found.   Visual Acuity Review  Right Eye Distance:   Left Eye Distance:   Bilateral Distance:    Right Eye Near:   Left Eye Near:    Bilateral Near:         MDM   1. Concern about STD in female without diagnosis     Patient is reassured that there are no issues that require transfer to higher level of care at this time or additional tests. Patient is advised to continue home symptomatic treatment. Patient is advised that if there are new or worsening symptoms to attend the emergency department, contact primary care provider, or return to UC. Instructions of care provided discharged home in  stable condition.    THIS NOTE WAS GENERATED USING A VOICE RECOGNITION SOFTWARE PROGRAM. ALL REASONABLE EFFORTS  WERE MADE TO PROOFREAD THIS DOCUMENT FOR ACCURACY.  I have verbally reviewed the discharge instructions with the patient. A printed AVS was given to the patient.  All questions were answered prior to discharge.      Tharon AquasFrank C Patrick, PA 09/22/15 1912

## 2015-09-22 NOTE — ED Notes (Signed)
Patient states she would like to be tested for STD and pregnancy test. Pt states her partner has tested positive, she is not experiencing any symptoms at this time No acute distress

## 2015-09-22 NOTE — Discharge Instructions (Signed)
Safe Sex  Safe sex is about reducing the risk of giving or getting a sexually transmitted disease (STD). STDs are spread through sexual contact involving the genitals, mouth, or rectum. Some STDs can be cured and others cannot. Safe sex can also prevent unintended pregnancies.   WHAT ARE SOME SAFE SEX PRACTICES?  · Limit your sexual activity to only one partner who is having sex with only you.  · Talk to your partner about his or her past partners, past STDs, and drug use.  · Use a condom every time you have sexual intercourse. This includes vaginal, oral, and anal sexual activity. Both females and males should wear condoms during oral sex. Only use latex or polyurethane condoms and water-based lubricants. Using petroleum-based lubricants or oils to lubricate a condom will weaken the condom and increase the chance that it will break. The condom should be in place from the beginning to the end of sexual activity. Wearing a condom reduces, but does not completely eliminate, your risk of getting or giving an STD. STDs can be spread by contact with infected body fluids and skin.  · Get vaccinated for hepatitis B and HPV.  · Avoid alcohol and recreational drugs, which can affect your judgment. You may forget to use a condom or participate in high-risk sex.  · For females, avoid douching after sexual intercourse. Douching can spread an infection farther into the reproductive tract.  · Check your body for signs of sores, blisters, rashes, or unusual discharge. See your health care provider if you notice any of these signs.  · Avoid sexual contact if you have symptoms of an infection or are being treated for an STD. If you or your partner has herpes, avoid sexual contact when blisters are present. Use condoms at all other times.  · If you are at risk of being infected with HIV, it is recommended that you take a prescription medicine daily to prevent HIV infection. This is called pre-exposure prophylaxis (PrEP). You are  considered at risk if:    You are a man who has sex with other men (MSM).    You are a heterosexual man or woman who is sexually active with more than one partner.    You take drugs by injection.    You are sexually active with a partner who has HIV.  · Talk with your health care provider about whether you are at high risk of being infected with HIV. If you choose to begin PrEP, you should first be tested for HIV. You should then be tested every 3 months for as long as you are taking PrEP.  · See your health care provider for regular screenings, exams, and tests for other STDs. Before having sex with a new partner, each of you should be screened for STDs and should talk about the results with each other.  WHAT ARE THE BENEFITS OF SAFE SEX?   · There is less chance of getting or giving an STD.  · You can prevent unwanted or unintended pregnancies.  · By discussing safe sex concerns with your partner, you may increase feelings of intimacy, comfort, trust, and honesty between the two of you.     This information is not intended to replace advice given to you by your health care provider. Make sure you discuss any questions you have with your health care provider.     Document Released: 05/26/2004 Document Revised: 05/09/2014 Document Reviewed: 10/10/2011  Elsevier Interactive Patient Education ©2016 Elsevier Inc.

## 2015-09-23 LAB — URINE CYTOLOGY ANCILLARY ONLY
CHLAMYDIA, DNA PROBE: POSITIVE — AB
NEISSERIA GONORRHEA: NEGATIVE
Trichomonas: NEGATIVE

## 2015-09-23 LAB — HIV ANTIBODY (ROUTINE TESTING W REFLEX): HIV SCREEN 4TH GENERATION: NONREACTIVE

## 2015-09-24 LAB — URINE CYTOLOGY ANCILLARY ONLY

## 2015-09-26 ENCOUNTER — Telehealth (HOSPITAL_COMMUNITY): Payer: Self-pay | Admitting: Emergency Medicine

## 2015-09-26 MED ORDER — METRONIDAZOLE 500 MG PO TABS: 500.0000 mg | ORAL_TABLET | Freq: Two times a day (BID) | ORAL | Status: DC

## 2015-09-26 MED ORDER — AZITHROMYCIN 250 MG PO TABS: 1000.0000 mg | ORAL_TABLET | Freq: Once | ORAL | Status: DC

## 2015-09-26 NOTE — ED Notes (Signed)
Called pt and notified of recent lab results from visit 5/23 Pt ID'd properly... Reports feeling better   Per Dr. Piedad ClimesHonig,  Please notify patient of positive BV and chlamydia. Will need to call in Flagyl 500mg  BID x7 day and Azithromycin 1g once to pharmacy of choice. Patient will also need to notify any partners of chlamydia.  Also notified of HIV being negative  Per pt's request... Called in Rxs to [facility] Adv pt if sx are not getting better to return  Pt verb understanding Education on safe sex given [facility]

## 2016-01-23 ENCOUNTER — Inpatient Hospital Stay (HOSPITAL_COMMUNITY)
Admission: AD | Admit: 2016-01-23 | Discharge: 2016-01-23 | Disposition: A | Discharge status: Home / Self Care | Payer: Medicaid Other | Source: Ambulatory Visit | Attending: Obstetrics and Gynecology | Admitting: Obstetrics and Gynecology

## 2016-01-23 ENCOUNTER — Encounter (HOSPITAL_COMMUNITY): Payer: Self-pay | Admitting: *Deleted

## 2016-01-23 DIAGNOSIS — F17210 Nicotine dependence, cigarettes, uncomplicated: Secondary | ICD-10-CM | POA: Diagnosis not present

## 2016-01-23 DIAGNOSIS — O26891 Other specified pregnancy related conditions, first trimester: Secondary | ICD-10-CM | POA: Diagnosis present

## 2016-01-23 DIAGNOSIS — Z3A00 Weeks of gestation of pregnancy not specified: Secondary | ICD-10-CM | POA: Diagnosis not present

## 2016-01-23 DIAGNOSIS — O99331 Smoking (tobacco) complicating pregnancy, first trimester: Secondary | ICD-10-CM | POA: Insufficient documentation

## 2016-01-23 DIAGNOSIS — Z3201 Encounter for pregnancy test, result positive: Secondary | ICD-10-CM | POA: Diagnosis not present

## 2016-01-23 NOTE — MAU Provider Note (Signed)
  History     CSN: [id number]  Arrival date and time: 01/23/16 1053   First Provider Initiated Contact with Patient 01/23/16 1157      Chief Complaint  Patient presents with  . Possible Pregnancy   HPI: [name] presents to MAU wanting to know how far pregnant she is. L:MP was 12/22/15. Home UPT positive x 3 yesterday. She denies any VB or cramps. Saw [name] with her last pregnancy    Past Medical History:  Diagnosis Date  . Anemia   . Asthma   . Chlamydia contact, treated   . GERD (gastroesophageal reflux disease)     Past Surgical History:  Procedure Laterality Date  . HERNIA REPAIR      Family History  Problem Relation Age of Onset  . Hypertension Other   . Diabetes Other     Social History  Substance Use Topics  . Smoking status: Current Every Day Smoker    Packs/day: 0.50    Types: Cigarettes  . Smokeless tobacco: Never Used  . Alcohol use Yes     Comment: socially    Allergies: No Known Allergies  Prescriptions Prior to Admission  Medication Sig Dispense Refill Last Dose  . azithromycin (ZITHROMAX) 250 MG tablet Take 4 tablets (1,000 mg total) by mouth once. 4 tablet 0   . etonogestrel (NEXPLANON) 68 MG IMPL implant 1 each by Subdermal route once.   Unknown at Unknown time  . ibuprofen (ADVIL,MOTRIN) 200 MG tablet Take 600 mg by mouth every 6 (six) hours as needed for headache or mild pain.   Unknown at Unknown time  . metroNIDAZOLE (FLAGYL) 500 MG tablet Take 1 tablet (500 mg total) by mouth 2 (two) times daily. 14 tablet 0     ROS Physical Exam   Blood pressure 130/74, pulse 70, temperature 98.8 F (37.1 C), temperature source Oral, resp. rate 17, height 5\' 6"  (1.676 m), weight 86.9 kg (191 lb 8 oz), last menstrual period 12/22/2015.  Physical Exam  MAU Course  Procedures  MDM   Assessment and Plan  Early Pregnancy  Pt was instructed to call [facility] on Monday to begin OB care. Return to MAU for heavy vaginal  bleeding Pt verbalized understanding.  Lori StaggersMichael L Lam Kopf 01/23/2016, 12:02 PM

## 2016-01-23 NOTE — MAU Note (Signed)
Period was 4 days late.  3 +HPT yesterday.  Has been nauseated and boobs have been hurting.

## 2016-01-23 NOTE — Discharge Instructions (Signed)
First Trimester of Pregnancy The first trimester of pregnancy is from week 1 until the end of week 12 (months 1 through 3). A week after a sperm fertilizes an egg, the egg will implant on the wall of the uterus. This embryo will begin to develop into a baby. Genes from you and your partner are forming the baby. The female genes determine whether the baby is a boy or a girl. At 6-8 weeks, the eyes and face are formed, and the heartbeat can be seen on ultrasound. At the end of 12 weeks, all the baby's organs are formed.  Now that you are pregnant, you will want to do everything you can to have a healthy baby. Two of the most important things are to get good prenatal care and to follow your health care provider's instructions. Prenatal care is all the medical care you receive before the baby's birth. This care will help prevent, find, and treat any problems during the pregnancy and childbirth. BODY CHANGES Your body goes through many changes during pregnancy. The changes vary from woman to woman.   You may gain or lose a couple of pounds at first.  You may feel sick to your stomach (nauseous) and throw up (vomit). If the vomiting is uncontrollable, call your health care provider.  You may tire easily.  You may develop headaches that can be relieved by medicines approved by your health care provider.  You may urinate more often. Painful urination may mean you have a bladder infection.  You may develop heartburn as a result of your pregnancy.  You may develop constipation because certain hormones are causing the muscles that push waste through your intestines to slow down.  You may develop hemorrhoids or swollen, bulging veins (varicose veins).  Your breasts may begin to grow larger and become tender. Your nipples may stick out more, and the tissue that surrounds them (areola) may become darker.  Your gums may bleed and may be sensitive to brushing and flossing.  Dark spots or blotches (chloasma,  mask of pregnancy) may develop on your face. This will likely fade after the baby is born.  Your menstrual periods will stop.  You may have a loss of appetite.  You may develop cravings for certain kinds of food.  You may have changes in your emotions from day to day, such as being excited to be pregnant or being concerned that something may go wrong with the pregnancy and baby.  You may have more vivid and strange dreams.  You may have changes in your hair. These can include thickening of your hair, rapid growth, and changes in texture. Some women also have hair loss during or after pregnancy, or hair that feels dry or thin. Your hair will most likely return to normal after your baby is born. WHAT TO EXPECT AT YOUR PRENATAL VISITS During a routine prenatal visit:  You will be weighed to make sure you and the baby are growing normally.  Your blood pressure will be taken.  Your abdomen will be measured to track your baby's growth.  The fetal heartbeat will be listened to starting around week 10 or 12 of your pregnancy.  Test results from any previous visits will be discussed. Your health care provider may ask you:  How you are feeling.  If you are feeling the baby move.  If you have had any abnormal symptoms, such as leaking fluid, bleeding, severe headaches, or abdominal cramping.  If you are using any tobacco products,   including cigarettes, chewing tobacco, and electronic cigarettes.  If you have any questions. Other tests that may be performed during your first trimester include:  Blood tests to find your blood type and to check for the presence of any previous infections. They will also be used to check for low iron levels (anemia) and Rh antibodies. Later in the pregnancy, blood tests for diabetes will be done along with other tests if problems develop.  Urine tests to check for infections, diabetes, or protein in the urine.  An ultrasound to confirm the proper growth  and development of the baby.  An amniocentesis to check for possible genetic problems.  Fetal screens for spina bifida and Down syndrome.  You may need other tests to make sure you and the baby are doing well.  HIV (human immunodeficiency virus) testing. Routine prenatal testing includes screening for HIV, unless you choose not to have this test. HOME CARE INSTRUCTIONS  Medicines  Follow your health care provider's instructions regarding medicine use. Specific medicines may be either safe or unsafe to take during pregnancy.  Take your prenatal vitamins as directed.  If you develop constipation, try taking a stool softener if your health care provider approves. Diet  Eat regular, well-balanced meals. Choose a variety of foods, such as meat or vegetable-based protein, fish, milk and low-fat dairy products, vegetables, fruits, and whole grain breads and cereals. Your health care provider will help you determine the amount of weight gain that is right for you.  Avoid raw meat and uncooked cheese. These carry germs that can cause birth defects in the baby.  Eating four or five small meals rather than three large meals a day may help relieve nausea and vomiting. If you start to feel nauseous, eating a few soda crackers can be helpful. Drinking liquids between meals instead of during meals also seems to help nausea and vomiting.  If you develop constipation, eat more high-fiber foods, such as fresh vegetables or fruit and whole grains. Drink enough fluids to keep your urine clear or pale yellow. Activity and Exercise  Exercise only as directed by your health care provider. Exercising will help you:  Control your weight.  Stay in shape.  Be prepared for labor and delivery.  Experiencing pain or cramping in the lower abdomen or low back is a good sign that you should stop exercising. Check with your health care provider before continuing normal exercises.  Try to avoid standing for long  periods of time. Move your legs often if you must stand in one place for a long time.  Avoid heavy lifting.  Wear low-heeled shoes, and practice good posture.  You may continue to have sex unless your health care provider directs you otherwise. Relief of Pain or Discomfort  Wear a good support bra for breast tenderness.   Take warm sitz baths to soothe any pain or discomfort caused by hemorrhoids. Use hemorrhoid cream if your health care provider approves.   Rest with your legs elevated if you have leg cramps or low back pain.  If you develop varicose veins in your legs, wear support hose. Elevate your feet for 15 minutes, 3-4 times a day. Limit salt in your diet. Prenatal Care  Schedule your prenatal visits by the twelfth week of pregnancy. They are usually scheduled monthly at first, then more often in the last 2 months before delivery.  Write down your questions. Take them to your prenatal visits.  Keep all your prenatal visits as directed by your   health care provider. Safety  Wear your seat belt at all times when driving.  Make a list of emergency phone numbers, including numbers for family, friends, the hospital, and police and fire departments. General Tips  Ask your health care provider for a referral to a local prenatal education class. Begin classes no later than at the beginning of month 6 of your pregnancy.  Ask for help if you have counseling or nutritional needs during pregnancy. Your health care provider can offer advice or refer you to specialists for help with various needs.  Do not use hot tubs, steam rooms, or saunas.  Do not douche or use tampons or scented sanitary pads.  Do not cross your legs for long periods of time.  Avoid cat litter boxes and soil used by cats. These carry germs that can cause birth defects in the baby and possibly loss of the fetus by miscarriage or stillbirth.  Avoid all smoking, herbs, alcohol, and medicines not prescribed by  your health care provider. Chemicals in these affect the formation and growth of the baby.  Do not use any tobacco products, including cigarettes, chewing tobacco, and electronic cigarettes. If you need help quitting, ask your health care provider. You may receive counseling support and other resources to help you quit.  Schedule a dentist appointment. At home, brush your teeth with a soft toothbrush and be gentle when you floss. SEEK MEDICAL CARE IF:   You have dizziness.  You have mild pelvic cramps, pelvic pressure, or nagging pain in the abdominal area.  You have persistent nausea, vomiting, or diarrhea.  You have a bad smelling vaginal discharge.  You have pain with urination.  You notice increased swelling in your face, hands, legs, or ankles. SEEK IMMEDIATE MEDICAL CARE IF:   You have a fever.  You are leaking fluid from your vagina.  You have spotting or bleeding from your vagina.  You have severe abdominal cramping or pain.  You have rapid weight gain or loss.  You vomit blood or material that looks like coffee grounds.  You are exposed to German measles and have never had them.  You are exposed to fifth disease or chickenpox.  You develop a severe headache.  You have shortness of breath.  You have any kind of trauma, such as from a fall or a car accident.   This information is not intended to replace advice given to you by your health care provider. Make sure you discuss any questions you have with your health care provider.   Document Released: 04/12/2001 Document Revised: 05/09/2014 Document Reviewed: 02/26/2013 Elsevier Interactive Patient Education 2016 [employer]  

## 2016-01-25 ENCOUNTER — Ambulatory Visit (HOSPITAL_COMMUNITY)
Admission: EM | Admit: 2016-01-25 | Discharge: 2016-01-25 | Disposition: A | Discharge status: Home / Self Care | Payer: Medicaid Other | Attending: Family Medicine | Admitting: Family Medicine

## 2016-01-25 ENCOUNTER — Encounter (HOSPITAL_COMMUNITY): Payer: Self-pay | Admitting: Emergency Medicine

## 2016-01-25 DIAGNOSIS — R102 Pelvic and perineal pain: Secondary | ICD-10-CM

## 2016-01-25 DIAGNOSIS — Z3201 Encounter for pregnancy test, result positive: Secondary | ICD-10-CM

## 2016-01-25 DIAGNOSIS — R109 Unspecified abdominal pain: Secondary | ICD-10-CM

## 2016-01-25 DIAGNOSIS — R1030 Lower abdominal pain, unspecified: Secondary | ICD-10-CM | POA: Diagnosis not present

## 2016-01-25 DIAGNOSIS — R110 Nausea: Secondary | ICD-10-CM

## 2016-01-25 DIAGNOSIS — Z331 Pregnant state, incidental: Secondary | ICD-10-CM

## 2016-01-25 LAB — POCT URINALYSIS DIP (DEVICE)
GLUCOSE, UA: NEGATIVE mg/dL
Hgb urine dipstick: NEGATIVE
KETONES UR: NEGATIVE mg/dL
Leukocytes, UA: NEGATIVE
NITRITE: NEGATIVE
PH: 6 (ref 5.0–8.0)
PROTEIN: NEGATIVE mg/dL
Specific Gravity, Urine: 1.025 (ref 1.005–1.030)
UROBILINOGEN UA: 1 mg/dL (ref 0.0–1.0)

## 2016-01-25 LAB — POCT PREGNANCY, URINE: PREG TEST UR: POSITIVE — AB

## 2016-01-25 NOTE — ED Provider Notes (Signed)
[facility]    CSN: [id number] Arrival date & time: 01/25/16  1810  First Provider Contact:  First MD Initiated Contact with Patient 01/25/16 1920        History   Chief Complaint Chief Complaint  Patient presents with  . Abdominal Pain    HPI Lori Lam is a 20 y.o. female.   The history is provided by the patient.  Abdominal Pain  Pain location:  Suprapubic Pain quality: dull   Pain radiates to:  L flank Pain severity:  Mild Duration:  10 days Progression:  Unchanged Chronicity:  New Relieved by:  None tried Worsened by:  Nothing Ineffective treatments:  None tried Associated symptoms: nausea   Associated symptoms: no diarrhea, no dysuria, no fever, no vaginal bleeding, no vaginal discharge and no vomiting   Risk factors: pregnancy     Past Medical History:  Diagnosis Date  . Anemia   . Asthma   . Chlamydia contact, treated   . GERD (gastroesophageal reflux disease)     Patient Active Problem List   Diagnosis Date Noted  . NSVD (normal spontaneous vaginal delivery) 12/28/2012  . Late prenatal care 12/28/2012  . H/O chlamydia infection 12/28/2012  . Teen pregnancy 12/28/2012  . Asthma 12/28/2012  . Depression 12/28/2012  . Marijuana use 12/28/2012    Past Surgical History:  Procedure Laterality Date  . HERNIA REPAIR      OB History    Gravida Para Term Preterm AB Living   1 1 1     1    SAB TAB Ectopic Multiple Live Births           1       Home Medications    Prior to Admission medications   Not on File    Family History Family History  Problem Relation Age of Onset  . Hypertension Other   . Diabetes Other     Social History Social History  Substance Use Topics  . Smoking status: Current Every Day Smoker    Packs/day: 0.50    Types: Cigarettes  . Smokeless tobacco: Never Used  . Alcohol use Yes     Comment: socially     Allergies   Review of patient's allergies indicates no known  allergies.   Review of Systems Review of Systems  Constitutional: Negative.  Negative for fever.  Gastrointestinal: Positive for abdominal pain and nausea. Negative for diarrhea and vomiting.  Genitourinary: Positive for menstrual problem and pelvic pain. Negative for dysuria, urgency, vaginal bleeding and vaginal discharge.  All other systems reviewed and are negative.    Physical Exam Triage Vital Signs ED Triage Vitals [01/25/16 1911]  Enc Vitals Group     BP (!) 108/51     Pulse Rate 60     Resp 16     Temp 98.5 F (36.9 C)     Temp Source Oral     SpO2 100 %     Weight      Height      Head Circumference      Peak Flow      Pain Score 8     Pain Loc      Pain Edu?      Excl. in GC?    No data found.   Updated Vital Signs BP (!) 108/51 (BP Location: Left Arm) Comment: notified rn   Pulse 60   Temp 98.5 F (36.9 C) (Oral)   Resp 16   LMP 12/22/2015 (Exact  Date)   SpO2 100%   Visual Acuity Right Eye Distance:   Left Eye Distance:   Bilateral Distance:    Right Eye Near:   Left Eye Near:    Bilateral Near:     Physical Exam  Constitutional: She is oriented to person, place, and time. She appears well-developed and well-nourished.  Cardiovascular: Normal rate and regular rhythm.   Pulmonary/Chest: Effort normal and breath sounds normal.  Abdominal: Soft. Bowel sounds are normal. She exhibits no distension and no mass. There is tenderness. There is no rebound and no guarding. No hernia.  Lymphadenopathy:    She has no cervical adenopathy.  Neurological: She is alert and oriented to person, place, and time.  Skin: Skin is warm and dry.  Nursing note and vitals reviewed.    UC Treatments / Results  Labs (all labs ordered are listed, but only abnormal results are displayed) Labs Reviewed - No data to display U/a neg U/preg pos.  EKG  EKG Interpretation None       Radiology No results found.  Procedures Procedures (including critical care  time)  Medications Ordered in UC Medications - No data to display   Initial Impression / Assessment and Plan / UC Course  I have reviewed the triage vital signs and the nursing notes.  Pertinent labs & imaging results that were available during my care of the patient were reviewed by me and considered in my medical decision making (see chart for details).  Clinical Course   g1p1   Final Clinical Impressions(s) / UC Diagnoses   Final diagnoses:  None    New Prescriptions New Prescriptions   No medications on file     Linna HoffJames D Kindl, MD 01/25/16 1943

## 2016-01-25 NOTE — ED Triage Notes (Signed)
Pt reports lower abdominal pain and cramping for about 10 days.  Pt admits to unprotected sex several times since her last period.  She denies any urinary frequency, odor, or discharge or any fever.

## 2016-01-25 NOTE — Discharge Instructions (Signed)
See your ob doctor for prenatal care going forward with your pregnancy.

## 2016-02-05 ENCOUNTER — Inpatient Hospital Stay (HOSPITAL_COMMUNITY)
Admission: AD | Admit: 2016-02-05 | Discharge: 2016-02-05 | Disposition: A | Discharge status: Home / Self Care | Payer: Medicaid Other | Source: Ambulatory Visit | Attending: Obstetrics & Gynecology | Admitting: Obstetrics & Gynecology

## 2016-02-05 ENCOUNTER — Encounter: Payer: Self-pay | Admitting: Student

## 2016-02-05 ENCOUNTER — Inpatient Hospital Stay (HOSPITAL_COMMUNITY): Payer: Medicaid Other

## 2016-02-05 DIAGNOSIS — R1030 Lower abdominal pain, unspecified: Secondary | ICD-10-CM | POA: Diagnosis present

## 2016-02-05 DIAGNOSIS — O99331 Smoking (tobacco) complicating pregnancy, first trimester: Secondary | ICD-10-CM | POA: Diagnosis not present

## 2016-02-05 DIAGNOSIS — O209 Hemorrhage in early pregnancy, unspecified: Secondary | ICD-10-CM | POA: Insufficient documentation

## 2016-02-05 DIAGNOSIS — Z3A01 Less than 8 weeks gestation of pregnancy: Secondary | ICD-10-CM | POA: Insufficient documentation

## 2016-02-05 DIAGNOSIS — R109 Unspecified abdominal pain: Secondary | ICD-10-CM | POA: Diagnosis not present

## 2016-02-05 DIAGNOSIS — N930 Postcoital and contact bleeding: Secondary | ICD-10-CM

## 2016-02-05 DIAGNOSIS — Z3491 Encounter for supervision of normal pregnancy, unspecified, first trimester: Secondary | ICD-10-CM

## 2016-02-05 DIAGNOSIS — F17210 Nicotine dependence, cigarettes, uncomplicated: Secondary | ICD-10-CM | POA: Diagnosis not present

## 2016-02-05 DIAGNOSIS — O26899 Other specified pregnancy related conditions, unspecified trimester: Secondary | ICD-10-CM

## 2016-02-05 DIAGNOSIS — O26891 Other specified pregnancy related conditions, first trimester: Secondary | ICD-10-CM | POA: Diagnosis not present

## 2016-02-05 HISTORY — DX: Anxiety disorder, unspecified: F41.9

## 2016-02-05 LAB — WET PREP, GENITAL
Clue Cells Wet Prep HPF POC: NONE SEEN
Sperm: NONE SEEN
TRICH WET PREP: NONE SEEN
YEAST WET PREP: NONE SEEN

## 2016-02-05 LAB — URINE MICROSCOPIC-ADD ON: RBC / HPF: NONE SEEN RBC/hpf (ref 0–5)

## 2016-02-05 LAB — CBC
HCT: 32.8 % — ABNORMAL LOW (ref 36.0–46.0)
HEMOGLOBIN: 11.3 g/dL — AB (ref 12.0–15.0)
MCH: 29.7 pg (ref 26.0–34.0)
MCHC: 34.5 g/dL (ref 30.0–36.0)
MCV: 86.3 fL (ref 78.0–100.0)
PLATELETS: 251 10*3/uL (ref 150–400)
RBC: 3.8 MIL/uL — ABNORMAL LOW (ref 3.87–5.11)
RDW: 12.7 % (ref 11.5–15.5)
WBC: 9.7 10*3/uL (ref 4.0–10.5)

## 2016-02-05 LAB — URINALYSIS, ROUTINE W REFLEX MICROSCOPIC
Bilirubin Urine: NEGATIVE
Glucose, UA: NEGATIVE mg/dL
Ketones, ur: NEGATIVE mg/dL
Leukocytes, UA: NEGATIVE
Nitrite: NEGATIVE
Protein, ur: NEGATIVE mg/dL
SPECIFIC GRAVITY, URINE: 1.02 (ref 1.005–1.030)
pH: 7 (ref 5.0–8.0)

## 2016-02-05 LAB — HCG, QUANTITATIVE, PREGNANCY: hCG, Beta Chain, Quant, S: 57301 m[IU]/mL — ABNORMAL HIGH (ref <5)

## 2016-02-05 NOTE — Discharge Instructions (Signed)

## 2016-02-05 NOTE — MAU Note (Signed)
Having some pink vag d/c when I wipe and abd cramps for couple hours. LMP 12/22/15. Unsure how far pregnant I am.

## 2016-02-05 NOTE — Progress Notes (Signed)
Erin Lawrence NP in earlier to discuss test results and d/c plan. Written and verbal d/c instructions given and understanding voiced 

## 2016-02-05 NOTE — MAU Provider Note (Signed)
History     CSN: [id number]  Arrival date and time: 02/05/16 2127   First Provider Initiated Contact with Patient 02/05/16 2207      Chief Complaint  Patient presents with  . Vaginal Bleeding  . Abdominal Cramping   HPI  Lori Lam is a 20 y.o. G2P1001 at [redacted]w[redacted]d by LMP who presents with abdominal cramping and vaginal bleeding. Symptoms began this morning. Noted pink spotting on toilet paper today every time she voids. Last intercourse yesterday. Denies vaginal discharge, dysuria, or increased frequency.  Lower abdominal cramping associated with the spotting. Pain comes & goes; rates 7/10. Has not treated. Nothing makes better or worse. Some nausea, no vomiting. Had to strain for hard stool this morning. Not treating the constipation.   OB History    Gravida Para Term Preterm AB Living   2 1 1     1    SAB TAB Ectopic Multiple Live Births           1      Past Medical History:  Diagnosis Date  . Anemia   . Anxiety   . Asthma   . Chlamydia contact, treated   . GERD (gastroesophageal reflux disease)     Past Surgical History:  Procedure Laterality Date  . HERNIA REPAIR      Family History  Problem Relation Age of Onset  . Hypertension Other   . Diabetes Other     Social History  Substance Use Topics  . Smoking status: Current Every Day Smoker    Packs/day: 0.50    Types: Cigarettes  . Smokeless tobacco: Never Used  . Alcohol use Yes     Comment: socially    Allergies: No Known Allergies  Prescriptions Prior to Admission  Medication Sig Dispense Refill Last Dose  . acetaminophen (TYLENOL) 325 MG tablet Take 325 mg by mouth every 6 (six) hours as needed.   Past Week at Unknown time  . Prenatal Vit-Fe Fumarate-FA (PRENATAL MULTIVITAMIN) TABS tablet Take 1 tablet by mouth daily at 12 noon.   02/04/2016 at Unknown time    Review of Systems  Gastrointestinal: Positive for abdominal pain, constipation and nausea. Negative for blood in stool, diarrhea and  vomiting.  Genitourinary: Negative for dysuria.       + vaginal spotting   Physical Exam   Blood pressure 128/59, pulse 64, temperature 98.3 F (36.8 C), resp. rate 18, height 5\' 6"  (1.676 m), weight 190 lb 3.2 oz (86.3 kg), last menstrual period 12/22/2015.  Physical Exam  Nursing note and vitals reviewed. Constitutional: She is oriented to person, place, and time. She appears well-developed and well-nourished. No distress.  HENT:  Head: Normocephalic and atraumatic.  Eyes: Conjunctivae are normal. Right eye exhibits no discharge. Left eye exhibits no discharge. No scleral icterus.  Neck: Normal range of motion.  Cardiovascular: Normal rate.   Respiratory: Effort normal. No respiratory distress.  GI: Soft. She exhibits no distension. There is no tenderness. There is no rebound.  Genitourinary: Uterus normal. Cervix exhibits no motion tenderness, no discharge and no friability. Right adnexum displays no mass and no tenderness. Left adnexum displays no mass and no tenderness. No bleeding in the vagina. Vaginal discharge (scant amount of tan discharge) found.  Neurological: She is alert and oriented to person, place, and time.  Skin: Skin is warm and dry. She is not diaphoretic.  Psychiatric: She has a normal mood and affect. Her behavior is normal. Judgment and thought content normal.  MAU Course  Procedures Results for orders placed or performed during the hospital encounter of 02/05/16 (from the past 24 hour(s))  Urinalysis, Routine w reflex microscopic (not at [facility])     Status: Abnormal   Collection Time: 02/05/16  9:45 PM  Result Value Ref Range   Color, Urine YELLOW YELLOW   APPearance CLEAR CLEAR   Specific Gravity, Urine 1.020 1.005 - 1.030   pH 7.0 5.0 - 8.0   Glucose, UA NEGATIVE NEGATIVE mg/dL   Hgb urine dipstick TRACE (A) NEGATIVE   Bilirubin Urine NEGATIVE NEGATIVE   Ketones, ur NEGATIVE NEGATIVE mg/dL   Protein, ur NEGATIVE NEGATIVE mg/dL   Nitrite NEGATIVE  NEGATIVE   Leukocytes, UA NEGATIVE NEGATIVE  Urine microscopic-add on     Status: Abnormal   Collection Time: 02/05/16  9:45 PM  Result Value Ref Range   Squamous Epithelial / LPF 0-5 (A) NONE SEEN   WBC, UA 0-5 0 - 5 WBC/hpf   RBC / HPF NONE SEEN 0 - 5 RBC/hpf   Bacteria, UA FEW (A) NONE SEEN   Urine-Other MUCOUS PRESENT   CBC     Status: Abnormal   Collection Time: 02/05/16  9:47 PM  Result Value Ref Range   WBC 9.7 4.0 - 10.5 K/uL   RBC 3.80 (L) 3.87 - 5.11 MIL/uL   Hemoglobin 11.3 (L) 12.0 - 15.0 g/dL   HCT 16.132.8 (L) 09.636.0 - 04.546.0 %   MCV 86.3 78.0 - 100.0 fL   MCH 29.7 26.0 - 34.0 pg   MCHC 34.5 30.0 - 36.0 g/dL   RDW 40.912.7 81.111.5 - 91.415.5 %   Platelets 251 150 - 400 K/uL  hCG, quantitative, pregnancy     Status: Abnormal   Collection Time: 02/05/16  9:47 PM  Result Value Ref Range   hCG, Beta Chain, Quant, S 57,301 (H) <5 mIU/mL  Wet prep, genital     Status: Abnormal   Collection Time: 02/05/16 10:15 PM  Result Value Ref Range   Yeast Wet Prep HPF POC NONE SEEN NONE SEEN   Trich, Wet Prep NONE SEEN NONE SEEN   Clue Cells Wet Prep HPF POC NONE SEEN NONE SEEN   WBC, Wet Prep HPF POC FEW (A) NONE SEEN   Sperm NONE SEEN    [place] Ob Comp Less 14 Wks  Result Date: 02/05/2016 CLINICAL DATA:  Acute onset of light vaginal bleeding and left pelvic cramping. Initial encounter. EXAM: OBSTETRIC <14 WK US AND TRANSVAGINAL OB US TECHNIQUE: Both transabdominal and transvaginal ultrasound examinations were performed for complete evaluation of the gestation as well as the maternal uterus, adnexal regions, and pelvic cul-de-sac. Transvaginal technique was performed to assess early pregnancy. COMPARISON:  CT of the abdomen and pelvis from 05/09/2009 FINDINGS: Intrauterine gestational sac: Single; visualized and normal in shape. Yolk sac:  Yes Embryo:  Yes Cardiac Activity: Yes Heart Rate: 112  bpm CRL:  6.1 mm   6 w   3 d                  US EDC: 09/27/2016 Subchorionic hemorrhage:  None visualized.  Maternal uterus/adnexae: The uterus is otherwise unremarkable. The ovaries are within normal limits. The right ovary measures 3.4 x 2.2 x 2.2 cm, while the left ovary measures 2.6 x 1.3 x 2.0 cm. No suspicious adnexal masses are seen; there is no evidence for ovarian torsion. No free fluid is seen within the pelvic cul-de-sac. IMPRESSION: Single live intrauterine pregnancy noted, with a crown-rump length  of 6 mm, corresponding to a gestational age of [redacted] weeks 3 days. This matches the gestational age by LMP, reflecting an estimated date of delivery of Sep 27, 2016. Electronically Signed   By: Roanna Raider M.D.   On: 02/05/2016 23:10   US Ob Transvaginal  Result Date: 02/05/2016 CLINICAL DATA:  Acute onset of light vaginal bleeding and left pelvic cramping. Initial encounter. EXAM: OBSTETRIC <14 WK [place] AND TRANSVAGINAL OB US TECHNIQUE: Both transabdominal and transvaginal ultrasound examinations were performed for complete evaluation of the gestation as well as the maternal uterus, adnexal regions, and pelvic cul-de-sac. Transvaginal technique was performed to assess early pregnancy. COMPARISON:  CT of the abdomen and pelvis from 05/09/2009 FINDINGS: Intrauterine gestational sac: Single; visualized and normal in shape. Yolk sac:  Yes Embryo:  Yes Cardiac Activity: Yes Heart Rate: 112  bpm CRL:  6.1 mm   6 w   3 d                  [place] EDC: 09/27/2016 Subchorionic hemorrhage:  None visualized. Maternal uterus/adnexae: The uterus is otherwise unremarkable. The ovaries are within normal limits. The right ovary measures 3.4 x 2.2 x 2.2 cm, while the left ovary measures 2.6 x 1.3 x 2.0 cm. No suspicious adnexal masses are seen; there is no evidence for ovarian torsion. No free fluid is seen within the pelvic cul-de-sac. IMPRESSION: Single live intrauterine pregnancy noted, with a crown-rump length of 6 mm, corresponding to a gestational age of [redacted] weeks 3 days. This matches the gestational age by LMP, reflecting an  estimated date of delivery of Sep 27, 2016. Electronically Signed   By: Roanna Raider M.D.   On: 02/05/2016 23:10    MDM +UPT UA, wet prep, GC/chlamydia, CBC, ABO/Rh, quant hCG, HIV, and [place] today to rule out ectopic pregnancy  Assessment and Plan  A: 1. Normal IUP (intrauterine pregnancy) on prenatal ultrasound, first trimester   2. Vaginal bleeding in pregnancy, first trimester   3. Abdominal cramping affecting pregnancy   4. Postcoital bleeding    P: Discharge home Take stool softener for constipation Pelvic rest Discussed reasons to return to [facility] Start prenatal care -- list of providers given GC/CT pending  Judeth Horn 02/05/2016, 10:04 PM

## 2016-02-06 LAB — HIV ANTIBODY (ROUTINE TESTING W REFLEX): HIV SCREEN 4TH GENERATION: NONREACTIVE

## 2016-02-08 LAB — GC/CHLAMYDIA PROBE AMP (~~LOC~~) NOT AT ARMC
Chlamydia: POSITIVE — AB
Neisseria Gonorrhea: NEGATIVE

## 2016-02-12 ENCOUNTER — Encounter: Payer: Self-pay | Admitting: Student

## 2016-02-12 ENCOUNTER — Telehealth: Payer: Self-pay | Admitting: Student

## 2016-02-12 DIAGNOSIS — A749 Chlamydial infection, unspecified: Secondary | ICD-10-CM

## 2016-02-12 MED ORDER — AZITHROMYCIN 500 MG PO TABS: 1000.0000 mg | ORAL_TABLET | Freq: Once | ORAL | 0 refills | Status: AC

## 2016-02-12 NOTE — Telephone Encounter (Signed)
Called phone number on file & was told this number does not belong to patient. Rx sent to pharmacy on file. [facility]. Certified letter will be sent to patient regarding positive chlamydia results.

## 2016-02-12 NOTE — Telephone Encounter (Signed)
Entered in error

## 2016-02-15 ENCOUNTER — Encounter: Payer: Self-pay | Admitting: Student

## 2016-03-09 ENCOUNTER — Encounter: Payer: Self-pay | Admitting: Obstetrics & Gynecology

## 2016-03-09 ENCOUNTER — Ambulatory Visit (INDEPENDENT_AMBULATORY_CARE_PROVIDER_SITE_OTHER): Payer: Medicaid Other | Admitting: Obstetrics & Gynecology

## 2016-03-09 ENCOUNTER — Other Ambulatory Visit (HOSPITAL_COMMUNITY)
Admission: RE | Admit: 2016-03-09 | Discharge: 2016-03-09 | Disposition: A | Discharge status: Home / Self Care | Payer: Medicaid Other | Source: Ambulatory Visit | Attending: Obstetrics & Gynecology | Admitting: Obstetrics & Gynecology

## 2016-03-09 DIAGNOSIS — Z3490 Encounter for supervision of normal pregnancy, unspecified, unspecified trimester: Secondary | ICD-10-CM | POA: Insufficient documentation

## 2016-03-09 DIAGNOSIS — Z3480 Encounter for supervision of other normal pregnancy, unspecified trimester: Secondary | ICD-10-CM

## 2016-03-09 DIAGNOSIS — Z8619 Personal history of other infectious and parasitic diseases: Secondary | ICD-10-CM

## 2016-03-09 DIAGNOSIS — Z3481 Encounter for supervision of other normal pregnancy, first trimester: Secondary | ICD-10-CM

## 2016-03-09 DIAGNOSIS — Z113 Encounter for screening for infections with a predominantly sexual mode of transmission: Secondary | ICD-10-CM | POA: Insufficient documentation

## 2016-03-09 DIAGNOSIS — Z01419 Encounter for gynecological examination (general) (routine) without abnormal findings: Secondary | ICD-10-CM | POA: Diagnosis not present

## 2016-03-09 MED ORDER — PRENATAL VITAMINS 0.8 MG PO TABS: 1.0000 | ORAL_TABLET | Freq: Every day | ORAL | 12 refills | Status: DC

## 2016-03-09 NOTE — Progress Notes (Signed)
  Subjective:    Lori Lam is a G2P1001 8952w1d being seen today for her first obstetrical visit.  Her obstetrical history is significant for h/o chlamydia infection. Patient does intend to breast feed. Pregnancy history fully reviewed.  Patient reports nausea and mild.  Vitals:   03/09/16 1451  BP: 123/80  Pulse: 74  Temp: 97 F (36.1 C)  Weight: 185 lb 12.8 oz (84.3 kg)    HISTORY: OB History  Gravida Para Term Preterm AB Living  2 1 1     1   SAB TAB Ectopic Multiple Live Births          1    # Outcome Date GA Lbr Len/2nd Weight Sex Delivery Anes PTL Lv  2 Current           1 Term 12/27/12 8161w4d 15:49 / 00:37 6 lb 12.4 oz (3.073 kg) M Vag-Spont EPI  LIV     Past Medical History:  Diagnosis Date  . Anemia   . Anxiety   . Asthma   . Chlamydia contact, treated   . GERD (gastroesophageal reflux disease)    Past Surgical History:  Procedure Laterality Date  . HERNIA REPAIR     Family History  Problem Relation Age of Onset  . Hypertension Other   . Diabetes Other   . Hypertension Maternal Grandmother   . Hypertension Maternal Grandfather   . Hypertension Paternal Grandmother      Exam    Uterus:     Pelvic Exam:    Perineum: No Hemorrhoids   Vulva: normal   Vagina:  normal mucosa   pH:     Cervix: no lesions   Adnexa: normal adnexa   Bony Pelvis: average  System: Breast:  normal appearance, no masses or tenderness   Skin: normal coloration and turgor, no rashes    Neurologic: oriented, normal mood   Extremities: normal strength, tone, and muscle mass   HEENT extra ocular movement intact, sclera clear, anicteric, neck supple with midline trachea and thyroid without masses   Mouth/Teeth mucous membranes moist, pharynx normal without lesions and dental hygiene good   Neck supple   Cardiovascular: regular rate and rhythm, no murmurs or gallops   Respiratory:  appears well, vitals normal, no respiratory distress, acyanotic, normal RR, neck free of  mass or lymphadenopathy, chest clear, no wheezing, crepitations, rhonchi, normal symmetric air entry   Abdomen: soft, non-tender; bowel sounds normal; no masses,  no organomegaly   Urinary: urethral meatus normal      Assessment:    Pregnancy: G2P1001 Patient Active Problem List   Diagnosis Date Noted  . Supervision of normal pregnancy, antepartum 03/09/2016  . H/O chlamydia infection 12/28/2012  . Asthma 12/28/2012  . Depression 12/28/2012  . Marijuana use 12/28/2012        Plan:     Initial labs drawn. Prenatal vitamins. Problem list reviewed and updated. Genetic Screening discussed First Screen: ordered.  Ultrasound discussed; fetal survey: 18+ weeks.  Follow up in 4 weeks. 50% of 30 min visit spent on counseling and coordination of care.     Anju Sereno 03/09/2016

## 2016-03-09 NOTE — Progress Notes (Signed)
Patient is in the office for initial visit, states that she feels good.

## 2016-03-09 NOTE — Patient Instructions (Signed)
First Trimester of Pregnancy The first trimester of pregnancy is from week 1 until the end of week 12 (months 1 through 3). A week after a sperm fertilizes an egg, the egg will implant on the wall of the uterus. This embryo will begin to develop into a baby. Genes from you and your partner are forming the baby. The female genes determine whether the baby is a boy or a girl. At 6-8 weeks, the eyes and face are formed, and the heartbeat can be seen on ultrasound. At the end of 12 weeks, all the baby's organs are formed.  Now that you are pregnant, you will want to do everything you can to have a healthy baby. Two of the most important things are to get good prenatal care and to follow your health care provider's instructions. Prenatal care is all the medical care you receive before the baby's birth. This care will help prevent, find, and treat any problems during the pregnancy and childbirth. BODY CHANGES Your body goes through many changes during pregnancy. The changes vary from woman to woman.   You may gain or lose a couple of pounds at first.  You may feel sick to your stomach (nauseous) and throw up (vomit). If the vomiting is uncontrollable, call your health care provider.  You may tire easily.  You may develop headaches that can be relieved by medicines approved by your health care provider.  You may urinate more often. Painful urination may mean you have a bladder infection.  You may develop heartburn as a result of your pregnancy.  You may develop constipation because certain hormones are causing the muscles that push waste through your intestines to slow down.  You may develop hemorrhoids or swollen, bulging veins (varicose veins).  Your breasts may begin to grow larger and become tender. Your nipples may stick out more, and the tissue that surrounds them (areola) may become darker.  Your gums may bleed and may be sensitive to brushing and flossing.  Dark spots or blotches (chloasma,  mask of pregnancy) may develop on your face. This will likely fade after the baby is born.  Your menstrual periods will stop.  You may have a loss of appetite.  You may develop cravings for certain kinds of food.  You may have changes in your emotions from day to day, such as being excited to be pregnant or being concerned that something may go wrong with the pregnancy and baby.  You may have more vivid and strange dreams.  You may have changes in your hair. These can include thickening of your hair, rapid growth, and changes in texture. Some women also have hair loss during or after pregnancy, or hair that feels dry or thin. Your hair will most likely return to normal after your baby is born. WHAT TO EXPECT AT YOUR PRENATAL VISITS During a routine prenatal visit:  You will be weighed to make sure you and the baby are growing normally.  Your blood pressure will be taken.  Your abdomen will be measured to track your baby's growth.  The fetal heartbeat will be listened to starting around week 10 or 12 of your pregnancy.  Test results from any previous visits will be discussed. Your health care provider may ask you:  How you are feeling.  If you are feeling the baby move.  If you have had any abnormal symptoms, such as leaking fluid, bleeding, severe headaches, or abdominal cramping.  If you are using any tobacco products,   including cigarettes, chewing tobacco, and electronic cigarettes.  If you have any questions. Other tests that may be performed during your first trimester include:  Blood tests to find your blood type and to check for the presence of any previous infections. They will also be used to check for low iron levels (anemia) and Rh antibodies. Later in the pregnancy, blood tests for diabetes will be done along with other tests if problems develop.  Urine tests to check for infections, diabetes, or protein in the urine.  An ultrasound to confirm the proper growth  and development of the baby.  An amniocentesis to check for possible genetic problems.  Fetal screens for spina bifida and Down syndrome.  You may need other tests to make sure you and the baby are doing well.  HIV (human immunodeficiency virus) testing. Routine prenatal testing includes screening for HIV, unless you choose not to have this test. HOME CARE INSTRUCTIONS  Medicines  Follow your health care provider's instructions regarding medicine use. Specific medicines may be either safe or unsafe to take during pregnancy.  Take your prenatal vitamins as directed.  If you develop constipation, try taking a stool softener if your health care provider approves. Diet  Eat regular, well-balanced meals. Choose a variety of foods, such as meat or vegetable-based protein, fish, milk and low-fat dairy products, vegetables, fruits, and whole grain breads and cereals. Your health care provider will help you determine the amount of weight gain that is right for you.  Avoid raw meat and uncooked cheese. These carry germs that can cause birth defects in the baby.  Eating four or five small meals rather than three large meals a day may help relieve nausea and vomiting. If you start to feel nauseous, eating a few soda crackers can be helpful. Drinking liquids between meals instead of during meals also seems to help nausea and vomiting.  If you develop constipation, eat more high-fiber foods, such as fresh vegetables or fruit and whole grains. Drink enough fluids to keep your urine clear or pale yellow. Activity and Exercise  Exercise only as directed by your health care provider. Exercising will help you:  Control your weight.  Stay in shape.  Be prepared for labor and delivery.  Experiencing pain or cramping in the lower abdomen or low back is a good sign that you should stop exercising. Check with your health care provider before continuing normal exercises.  Try to avoid standing for long  periods of time. Move your legs often if you must stand in one place for a long time.  Avoid heavy lifting.  Wear low-heeled shoes, and practice good posture.  You may continue to have sex unless your health care provider directs you otherwise. Relief of Pain or Discomfort  Wear a good support bra for breast tenderness.   Take warm sitz baths to soothe any pain or discomfort caused by hemorrhoids. Use hemorrhoid cream if your health care provider approves.   Rest with your legs elevated if you have leg cramps or low back pain.  If you develop varicose veins in your legs, wear support hose. Elevate your feet for 15 minutes, 3-4 times a day. Limit salt in your diet. Prenatal Care  Schedule your prenatal visits by the twelfth week of pregnancy. They are usually scheduled monthly at first, then more often in the last 2 months before delivery.  Write down your questions. Take them to your prenatal visits.  Keep all your prenatal visits as directed by your   health care provider. Safety  Wear your seat belt at all times when driving.  Make a list of emergency phone numbers, including numbers for family, friends, the hospital, and police and fire departments. General Tips  Ask your health care provider for a referral to a local prenatal education class. Begin classes no later than at the beginning of month 6 of your pregnancy.  Ask for help if you have counseling or nutritional needs during pregnancy. Your health care provider can offer advice or refer you to specialists for help with various needs.  Do not use hot tubs, steam rooms, or saunas.  Do not douche or use tampons or scented sanitary pads.  Do not cross your legs for long periods of time.  Avoid cat litter boxes and soil used by cats. These carry germs that can cause birth defects in the baby and possibly loss of the fetus by miscarriage or stillbirth.  Avoid all smoking, herbs, alcohol, and medicines not prescribed by  your health care provider. Chemicals in these affect the formation and growth of the baby.  Do not use any tobacco products, including cigarettes, chewing tobacco, and electronic cigarettes. If you need help quitting, ask your health care provider. You may receive counseling support and other resources to help you quit.  Schedule a dentist appointment. At home, brush your teeth with a soft toothbrush and be gentle when you floss. SEEK MEDICAL CARE IF:   You have dizziness.  You have mild pelvic cramps, pelvic pressure, or nagging pain in the abdominal area.  You have persistent nausea, vomiting, or diarrhea.  You have a bad smelling vaginal discharge.  You have pain with urination.  You notice increased swelling in your face, hands, legs, or ankles. SEEK IMMEDIATE MEDICAL CARE IF:   You have a fever.  You are leaking fluid from your vagina.  You have spotting or bleeding from your vagina.  You have severe abdominal cramping or pain.  You have rapid weight gain or loss.  You vomit blood or material that looks like coffee grounds.  You are exposed to German measles and have never had them.  You are exposed to fifth disease or chickenpox.  You develop a severe headache.  You have shortness of breath.  You have any kind of trauma, such as from a fall or a car accident.   This information is not intended to replace advice given to you by your health care provider. Make sure you discuss any questions you have with your health care provider.   Document Released: 04/12/2001 Document Revised: 05/09/2014 Document Reviewed: 02/26/2013 Elsevier Interactive Patient Education 2016 [employer] Inc[employer]  

## 2016-03-10 LAB — GC/CHLAMYDIA PROBE AMP (~~LOC~~) NOT AT ARMC
Chlamydia: NEGATIVE
NEISSERIA GONORRHEA: NEGATIVE

## 2016-03-11 LAB — CYTOLOGY - PAP: DIAGNOSIS: NEGATIVE

## 2016-03-11 LAB — CULTURE, OB URINE

## 2016-03-11 LAB — URINE CULTURE, OB REFLEX

## 2016-03-16 LAB — PRENATAL PROFILE I(LABCORP)
Antibody Screen: NEGATIVE
Basophils Absolute: 0 10*3/uL (ref 0.0–0.2)
Basos: 0 %
EOS (ABSOLUTE): 0.1 10*3/uL (ref 0.0–0.4)
EOS: 1 %
HEP B S AG: NEGATIVE
Hematocrit: 34.3 % (ref 34.0–46.6)
Hemoglobin: 12 g/dL (ref 11.1–15.9)
IMMATURE GRANULOCYTES: 0 %
Immature Grans (Abs): 0 10*3/uL (ref 0.0–0.1)
LYMPHS ABS: 2.3 10*3/uL (ref 0.7–3.1)
Lymphs: 26 %
MCH: 29.6 pg (ref 26.6–33.0)
MCHC: 35 g/dL (ref 31.5–35.7)
MCV: 85 fL (ref 79–97)
MONOCYTES: 9 %
Monocytes Absolute: 0.8 10*3/uL (ref 0.1–0.9)
NEUTROS ABS: 5.8 10*3/uL (ref 1.4–7.0)
NEUTROS PCT: 64 %
Platelets: 278 10*3/uL (ref 150–379)
RBC: 4.05 x10E6/uL (ref 3.77–5.28)
RDW: 13.3 % (ref 12.3–15.4)
RH TYPE: POSITIVE
RPR: NONREACTIVE
RUBELLA: 5.8 {index} (ref >0.99)
WBC: 9 10*3/uL (ref 3.4–10.8)

## 2016-03-16 LAB — HIV ANTIBODY (ROUTINE TESTING W REFLEX): HIV SCREEN 4TH GENERATION: NONREACTIVE

## 2016-03-16 LAB — TOXASSURE SELECT 13 (MW), URINE

## 2016-03-17 ENCOUNTER — Other Ambulatory Visit: Payer: Self-pay | Admitting: Obstetrics & Gynecology

## 2016-03-17 ENCOUNTER — Ambulatory Visit (HOSPITAL_COMMUNITY)
Admission: RE | Admit: 2016-03-17 | Discharge: 2016-03-17 | Disposition: A | Discharge status: Home / Self Care | Payer: Medicaid Other | Source: Ambulatory Visit | Attending: Obstetrics & Gynecology | Admitting: Obstetrics & Gynecology

## 2016-03-17 ENCOUNTER — Encounter (HOSPITAL_COMMUNITY): Payer: Self-pay

## 2016-03-17 DIAGNOSIS — Z3682 Encounter for antenatal screening for nuchal translucency: Secondary | ICD-10-CM

## 2016-03-17 DIAGNOSIS — Z3A12 12 weeks gestation of pregnancy: Secondary | ICD-10-CM

## 2016-03-17 DIAGNOSIS — Z3480 Encounter for supervision of other normal pregnancy, unspecified trimester: Secondary | ICD-10-CM

## 2016-03-28 ENCOUNTER — Other Ambulatory Visit (HOSPITAL_COMMUNITY): Payer: Self-pay

## 2016-03-31 ENCOUNTER — Telehealth: Payer: Self-pay | Admitting: *Deleted

## 2016-03-31 NOTE — Telephone Encounter (Signed)
Patient wants to know what labs were preformed at her appointment. 03/31/2016 10:49 LM on VM to CB if wishes.

## 2016-04-06 ENCOUNTER — Telehealth (HOSPITAL_COMMUNITY): Payer: Self-pay

## 2016-04-06 ENCOUNTER — Encounter: Payer: Self-pay | Admitting: Obstetrics & Gynecology

## 2016-04-06 ENCOUNTER — Ambulatory Visit (INDEPENDENT_AMBULATORY_CARE_PROVIDER_SITE_OTHER): Payer: Medicaid Other | Admitting: Obstetrics & Gynecology

## 2016-04-06 DIAGNOSIS — Z3482 Encounter for supervision of other normal pregnancy, second trimester: Secondary | ICD-10-CM

## 2016-04-06 DIAGNOSIS — Z3480 Encounter for supervision of other normal pregnancy, unspecified trimester: Secondary | ICD-10-CM

## 2016-04-06 NOTE — Progress Notes (Signed)
Patient is concerned about possible exposure to herpes virus.

## 2016-04-06 NOTE — Progress Notes (Signed)
   PRENATAL VISIT NOTE  Subjective:  Lori Lam is a 20 y.o. G2P1001 at 1057w1d being seen today for ongoing prenatal care.  She is currently monitored for the following issues for this low-risk pregnancy and has H/O chlamydia infection; Asthma; Depression; Marijuana use; and Supervision of normal pregnancy, antepartum on her problem list.  Patient reports no complaints.  Contractions: Not present. Vag. Bleeding: None.   . Denies leaking of fluid.   The following portions of the patient's history were reviewed and updated as appropriate: allergies, current medications, past family history, past medical history, past social history, past surgical history and problem list. Problem list updated.  Objective:   Vitals:   04/06/16 1342  BP: 132/81  Pulse: 76  Weight: 185 lb (83.9 kg)    Fetal Status: Fetal Heart Rate (bpm): 156         General:  Alert, oriented and cooperative. Patient is in no acute distress.  Skin: Skin is warm and dry. No rash noted.   Cardiovascular: Normal heart rate noted  Respiratory: Normal respiratory effort, no problems with respiration noted  Abdomen: Soft, gravid, appropriate for gestational age. Pain/Pressure: Absent     Pelvic:  Cervical exam deferred        Extremities: Normal range of motion.  Edema: None  Mental Status: Normal mood and affect. Normal behavior. Normal judgment and thought content.   Assessment and Plan:  Pregnancy: G2P1001 at 5957w1d  NT was nl  1. Supervision of other normal pregnancy, antepartum  - US OB Comp + 14 Wk; Future Screening for HSV in asymptomatic patient is not indicated Preterm labor symptoms and general obstetric precautions including but not limited to vaginal bleeding, contractions, leaking of fluid and fetal movement were reviewed in detail with the patient. Please refer to After Visit Summary for other counseling recommendations.  Return in about 4 weeks (around 05/04/2016).   Adam PhenixJames G Shahan Starks, MD

## 2016-04-06 NOTE — Patient Instructions (Signed)

## 2016-04-19 ENCOUNTER — Other Ambulatory Visit: Payer: Medicaid Other

## 2016-04-28 ENCOUNTER — Ambulatory Visit: Payer: Medicaid Other

## 2016-04-28 DIAGNOSIS — Z3480 Encounter for supervision of other normal pregnancy, unspecified trimester: Secondary | ICD-10-CM

## 2016-04-29 ENCOUNTER — Encounter: Payer: Self-pay | Admitting: *Deleted

## 2016-05-02 NOTE — L&D Delivery Note (Signed)
Delivery Note At 12:52 AM a healthy female was delivered via  (Presentation: OA).  APGAR: 9, 9; weight: 7 lbs 11.3 oz Placenta status: spontaneous, intact .  Cord: 3VC with the following complications: none.  Cord pH: N/A  Anesthesia: Epidural Episiotomy: None  Lacerations: None   Est. Blood Loss (mL): 50  Mom to postpartum.  Baby to Couplet care / Skin to Skin.  Ivan AnchorsJohn Adeolu [facility] 09/25/2016 1:05 AM   MEDICAL STUDENT DELIVERY ATTESTATION  I was gloved and present for the delivery in its entirety, and I agree with the above resident's note.    Raelyn Moraolitta Edit Ricciardelli, CNM 1:18 AM

## 2016-05-04 ENCOUNTER — Encounter: Payer: Medicaid Other | Admitting: Obstetrics and Gynecology

## 2016-05-05 ENCOUNTER — Other Ambulatory Visit: Payer: Medicaid Other

## 2016-05-05 ENCOUNTER — Encounter: Payer: Medicaid Other | Admitting: Obstetrics & Gynecology

## 2016-05-09 ENCOUNTER — Other Ambulatory Visit (HOSPITAL_COMMUNITY)
Admission: RE | Admit: 2016-05-09 | Discharge: 2016-05-09 | Disposition: A | Discharge status: Home / Self Care | Payer: Medicaid Other | Source: Ambulatory Visit | Attending: Obstetrics and Gynecology | Admitting: Obstetrics and Gynecology

## 2016-05-09 ENCOUNTER — Ambulatory Visit (INDEPENDENT_AMBULATORY_CARE_PROVIDER_SITE_OTHER): Payer: Medicaid Other | Admitting: Obstetrics and Gynecology

## 2016-05-09 VITALS — BP 133/78 | HR 93 | Temp 98.7°F | Wt 194.0 lb

## 2016-05-09 DIAGNOSIS — Z3482 Encounter for supervision of other normal pregnancy, second trimester: Secondary | ICD-10-CM

## 2016-05-09 DIAGNOSIS — Z3480 Encounter for supervision of other normal pregnancy, unspecified trimester: Secondary | ICD-10-CM

## 2016-05-09 DIAGNOSIS — Z113 Encounter for screening for infections with a predominantly sexual mode of transmission: Secondary | ICD-10-CM | POA: Diagnosis present

## 2016-05-09 DIAGNOSIS — Z8619 Personal history of other infectious and parasitic diseases: Secondary | ICD-10-CM

## 2016-05-09 NOTE — Progress Notes (Signed)
   PRENATAL VISIT NOTE  Subjective:  Lori Lam is a 21 y.o. G2P1001 at 4229w6d being seen today for ongoing prenatal care.  She is currently monitored for the following issues for this low-risk pregnancy and has H/O chlamydia infection; Asthma; Depression; Marijuana use; and Supervision of normal pregnancy, antepartum on her problem list.  Patient reports possible exposure to STD and is requesting testing. She denies any pelvic pain or abnormal discharge.  Contractions: Not present. Vag. Bleeding: None.  Movement: Present. Denies leaking of fluid.   The following portions of the patient's history were reviewed and updated as appropriate: allergies, current medications, past family history, past medical history, past social history, past surgical history and problem list. Problem list updated.  Objective:   Vitals:   05/09/16 1443  BP: 133/78  Pulse: 93  Temp: 98.7 F (37.1 C)  Weight: 194 lb (88 kg)    Fetal Status: Fetal Heart Rate (bpm): 159   Movement: Present     General:  Alert, oriented and cooperative. Patient is in no acute distress.  Skin: Skin is warm and dry. No rash noted.   Cardiovascular: Normal heart rate noted  Respiratory: Normal respiratory effort, no problems with respiration noted  Abdomen: Soft, gravid, appropriate for gestational age. Pain/Pressure: Absent     Pelvic:  Cervical exam deferred        Extremities: Normal range of motion.  Edema: None  Mental Status: Normal mood and affect. Normal behavior. Normal judgment and thought content.   Assessment and Plan:  Pregnancy: G2P1001 at 7129w6d  1. Supervision of other normal pregnancy, antepartum Patient is doing well Cultures collected Reviewed ultrasound results Quad screen today - GC/Chlamydia probe amp ([place])not at [facility]  2. H/O chlamydia infection Cultures collected  General obstetric precautions including but not limited to vaginal bleeding, contractions, leaking of fluid and fetal  movement were reviewed in detail with the patient. Please refer to After Visit Summary for other counseling recommendations.  Return in about 4 weeks (around 06/06/2016).   Catalina AntiguaPeggy Takoda Janowiak, MD

## 2016-05-09 NOTE — Progress Notes (Signed)
Patient is in the office and reports good fetal movement 

## 2016-05-11 LAB — GC/CHLAMYDIA PROBE AMP (~~LOC~~) NOT AT ARMC
Chlamydia: NEGATIVE
NEISSERIA GONORRHEA: NEGATIVE

## 2016-05-12 LAB — AFP, QUAD SCREEN
DIA MOM VALUE: 1.24
DIA Value (EIA): 211.68 pg/mL
DSR (By Age)    1 IN: 1111
DSR (Second Trimester) 1 IN: 1726
GESTATIONAL AGE AFP: 19.9 wk
MATERNAL AGE AT EDD: 22.7 a
MSAFP Mom: 0.77
MSAFP: 40 ng/mL
MSHCG Mom: 1.38
MSHCG: 27944 m[IU]/mL
OSB RISK: 10000
T18 (By Age): 1:4329 {titer}
Test Results:: NEGATIVE
UE3 MOM: 0.96
UE3 VALUE: 1.66 ng/mL
Weight: 194 [lb_av]

## 2016-06-06 ENCOUNTER — Encounter: Payer: Medicaid Other | Admitting: Obstetrics

## 2016-06-22 ENCOUNTER — Ambulatory Visit (INDEPENDENT_AMBULATORY_CARE_PROVIDER_SITE_OTHER): Payer: Medicaid Other | Admitting: Obstetrics

## 2016-06-22 ENCOUNTER — Encounter: Payer: Self-pay | Admitting: Obstetrics

## 2016-06-22 VITALS — BP 111/75 | HR 79 | Wt 198.6 lb

## 2016-06-22 DIAGNOSIS — K219 Gastro-esophageal reflux disease without esophagitis: Secondary | ICD-10-CM

## 2016-06-22 DIAGNOSIS — Z3482 Encounter for supervision of other normal pregnancy, second trimester: Secondary | ICD-10-CM

## 2016-06-22 DIAGNOSIS — Z3480 Encounter for supervision of other normal pregnancy, unspecified trimester: Secondary | ICD-10-CM

## 2016-06-22 MED ORDER — OMEPRAZOLE 20 MG PO CPDR: 20.0000 mg | DELAYED_RELEASE_CAPSULE | Freq: Two times a day (BID) | ORAL | 5 refills | Status: DC

## 2016-06-22 NOTE — Progress Notes (Signed)
Subjective:  Lori Lam is a 21 y.o. G2P1001 at 1919w1d being seen today for ongoing prenatal care.  She is currently monitored for the following issues for this low-risk pregnancy and has H/O chlamydia infection; Asthma; Depression; Marijuana use; and Supervision of normal pregnancy, antepartum on her problem list.  Patient reports heartburn.  Contractions: Not present. Vag. Bleeding: None.  Movement: Present. Denies leaking of fluid.   The following portions of the patient's history were reviewed and updated as appropriate: allergies, current medications, past family history, past medical history, past social history, past surgical history and problem list. Problem list updated.  Objective:   Vitals:   06/22/16 1626  BP: 111/75  Pulse: 79  Weight: 198 lb 9.6 oz (90.1 kg)    Fetal Status: Fetal Heart Rate (bpm): 150   Movement: Present     General:  Alert, oriented and cooperative. Patient is in no acute distress.  Skin: Skin is warm and dry. No rash noted.   Cardiovascular: Normal heart rate noted  Respiratory: Normal respiratory effort, no problems with respiration noted  Abdomen: Soft, gravid, appropriate for gestational age. Pain/Pressure: Absent     Pelvic:  Cervical exam deferred        Extremities: Normal range of motion.  Edema: None  Mental Status: Normal mood and affect. Normal behavior. Normal judgment and thought content.   Urinalysis:      Assessment and Plan:  Pregnancy: G2P1001 at 5919w1d  1. GERD without esophagitis Rx: - omeprazole (PRILOSEC) 20 MG capsule; Take 1 capsule (20 mg total) by mouth 2 (two) times daily before a meal.  Dispense: 60 capsule; Refill: 5  Preterm labor symptoms and general obstetric precautions including but not limited to vaginal bleeding, contractions, leaking of fluid and fetal movement were reviewed in detail with the patient. Please refer to After Visit Summary for other counseling recommendations.  No Follow-up on  file.   Brock Badharles A Ernestine Langworthy, MDPatient ID: Lori Lam, female   DOB: 01/10/1996, 21 y.o.   MRN: [id number]

## 2016-06-22 NOTE — Progress Notes (Signed)
Patient has been having increased reflux and heartburn.

## 2016-06-29 ENCOUNTER — Inpatient Hospital Stay (HOSPITAL_COMMUNITY)
Admission: AD | Admit: 2016-06-29 | Discharge: 2016-06-29 | Disposition: A | Discharge status: Home / Self Care | Payer: Medicaid Other | Source: Ambulatory Visit | Attending: Obstetrics & Gynecology | Admitting: Obstetrics & Gynecology

## 2016-06-29 ENCOUNTER — Encounter (HOSPITAL_COMMUNITY): Payer: Self-pay

## 2016-06-29 DIAGNOSIS — Z87891 Personal history of nicotine dependence: Secondary | ICD-10-CM | POA: Insufficient documentation

## 2016-06-29 DIAGNOSIS — R109 Unspecified abdominal pain: Secondary | ICD-10-CM | POA: Diagnosis present

## 2016-06-29 DIAGNOSIS — O23592 Infection of other part of genital tract in pregnancy, second trimester: Secondary | ICD-10-CM | POA: Diagnosis not present

## 2016-06-29 DIAGNOSIS — O26899 Other specified pregnancy related conditions, unspecified trimester: Secondary | ICD-10-CM | POA: Diagnosis not present

## 2016-06-29 DIAGNOSIS — N760 Acute vaginitis: Secondary | ICD-10-CM | POA: Diagnosis not present

## 2016-06-29 DIAGNOSIS — R102 Pelvic and perineal pain: Secondary | ICD-10-CM | POA: Diagnosis not present

## 2016-06-29 DIAGNOSIS — Z3A27 27 weeks gestation of pregnancy: Secondary | ICD-10-CM | POA: Diagnosis not present

## 2016-06-29 DIAGNOSIS — B9689 Other specified bacterial agents as the cause of diseases classified elsewhere: Secondary | ICD-10-CM | POA: Insufficient documentation

## 2016-06-29 LAB — URINALYSIS, ROUTINE W REFLEX MICROSCOPIC
BILIRUBIN URINE: NEGATIVE
Bacteria, UA: NONE SEEN
Glucose, UA: NEGATIVE mg/dL
Hgb urine dipstick: NEGATIVE
Ketones, ur: NEGATIVE mg/dL
Nitrite: NEGATIVE
PH: 7 (ref 5.0–8.0)
Protein, ur: NEGATIVE mg/dL
SPECIFIC GRAVITY, URINE: 1.023 (ref 1.005–1.030)

## 2016-06-29 LAB — WET PREP, GENITAL
SPERM: NONE SEEN
TRICH WET PREP: NONE SEEN
Yeast Wet Prep HPF POC: NONE SEEN

## 2016-06-29 MED ORDER — METRONIDAZOLE 500 MG PO TABS: 500.0000 mg | ORAL_TABLET | Freq: Two times a day (BID) | ORAL | 0 refills | Status: DC

## 2016-06-29 NOTE — MAU Note (Signed)
Pt states mucous plug came out several days ago. Today patient began having some lower abdominal cramping-rates 7/10 and pelvic pressure. Denies vaginal bleeding. Some mucous discharge. +FM.

## 2016-06-29 NOTE — Discharge Instructions (Signed)

## 2016-06-29 NOTE — MAU Provider Note (Signed)
History     CSN: [id number]  Arrival date and time: 06/29/16 4098   First Provider Initiated Contact with Patient 06/29/16 2016      Chief Complaint  Patient presents with  . Abdominal Cramping  . Vaginal Discharge   HPI   Ms.Lori Lam is a 21 y.o. female G2P1001 @ [redacted]w[redacted]d here in MAU with concerns about her mucus plug coming out. She thinks it came out yesterday. She was not having any pain at the time that it came out. At 0400 she started feeling abdominal cramping and it stopped. The pain returned this afternoon. The pain is in her lower abdomen. The pain is worse when she walks " when I walk it feels like he is pushing down". No recent intercourse. + fetal movement.   OB History    Gravida Para Term Preterm AB Living   2 1 1     1    SAB TAB Ectopic Multiple Live Births           1      Past Medical History:  Diagnosis Date  . Anemia   . Anxiety   . Asthma   . Chlamydia contact, treated   . GERD (gastroesophageal reflux disease)     Past Surgical History:  Procedure Laterality Date  . HERNIA REPAIR      Family History  Problem Relation Age of Onset  . Hypertension Other   . Diabetes Other   . Hypertension Maternal Grandmother   . Hypertension Maternal Grandfather   . Hypertension Paternal Grandmother     Social History  Substance Use Topics  . Smoking status: Former Smoker    Packs/day: 0.50    Types: Cigarettes    Quit date: 11/15/2015  . Smokeless tobacco: Never Used  . Alcohol use No     Comment: socially    Allergies: No Known Allergies  Prescriptions Prior to Admission  Medication Sig Dispense Refill Last Dose  . acetaminophen (TYLENOL) 325 MG tablet Take 325 mg by mouth every 6 (six) hours as needed.   Taking  . omeprazole (PRILOSEC) 20 MG capsule Take 1 capsule (20 mg total) by mouth 2 (two) times daily before a meal. 60 capsule 5   . Prenatal Multivit-Min-Fe-FA (PRENATAL VITAMINS) 0.8 MG tablet Take 1 tablet by mouth daily. 30 tablet  12 Taking   Results for orders placed or performed during the hospital encounter of 06/29/16 (from the past 48 hour(s))  Urinalysis, Routine w reflex microscopic     Status: Abnormal   Collection Time: 06/29/16  7:46 PM  Result Value Ref Range   Color, Urine YELLOW YELLOW   APPearance CLEAR CLEAR   Specific Gravity, Urine 1.023 1.005 - 1.030   pH 7.0 5.0 - 8.0   Glucose, UA NEGATIVE NEGATIVE mg/dL   Hgb urine dipstick NEGATIVE NEGATIVE   Bilirubin Urine NEGATIVE NEGATIVE   Ketones, ur NEGATIVE NEGATIVE mg/dL   Protein, ur NEGATIVE NEGATIVE mg/dL   Nitrite NEGATIVE NEGATIVE   Leukocytes, UA SMALL (A) NEGATIVE   RBC / HPF 0-5 0 - 5 RBC/hpf   WBC, UA 0-5 0 - 5 WBC/hpf   Bacteria, UA NONE SEEN NONE SEEN   Squamous Epithelial / LPF 0-5 (A) NONE SEEN   Mucous PRESENT   Wet prep, genital     Status: Abnormal   Collection Time: 06/29/16  8:23 PM  Result Value Ref Range   Yeast Wet Prep HPF POC NONE SEEN NONE SEEN   Trich, Wet Prep  NONE SEEN NONE SEEN   Clue Cells Wet Prep HPF POC PRESENT (A) NONE SEEN   WBC, Wet Prep HPF POC MANY (A) NONE SEEN    Comment: MANY BACTERIA SEEN   Sperm NONE SEEN     Review of Systems  Constitutional: Negative for fever.  Genitourinary: Negative for vaginal bleeding.   Physical Exam   Blood pressure 128/64, pulse 75, temperature 98.5 F (36.9 C), temperature source Oral, resp. rate 16, height 5\' 6"  (1.676 m), weight 203 lb (92.1 kg), last menstrual period 12/22/2015, SpO2 100 %.  Physical Exam  Constitutional: She is oriented to person, place, and time. She appears well-developed and well-nourished. No distress.  HENT:  Head: Normocephalic.  Eyes: Pupils are equal, round, and reactive to light.  GI: Soft. She exhibits no distension. There is no tenderness. There is no rebound.  Genitourinary:  Genitourinary Comments: Dilation: Closed Effacement (%): Thick Exam by:: jENNIFER rASCH np   Musculoskeletal: Normal range of motion.  Neurological: She  is alert and oriented to person, place, and time.  Skin: Skin is warm. She is not diaphoretic.  Psychiatric: Her behavior is normal.   Fetal Tracing: Baseline: 135 bpm  Variability: Moderate  Accelerations: 15x15 Decelerations: None Toco: None  MAU Course  Procedures  None   MDM   Wet prep Cervix closed, no contractions noted   Assessment and Plan   A:  1. Pain of round ligament affecting pregnancy, antepartum   2. Bacterial vaginosis     P:  Discharge home in stable condition Pregnancy support belt recommended Rx: Flagyl Return to MAU If symptoms worsen Follow up with OB as scheduled or sooner If needed  Duane LopeJennifer I Rasch, NP 06/29/2016 9:06 PM

## 2016-07-05 NOTE — Telephone Encounter (Signed)
See "Contacts" 

## 2016-07-06 ENCOUNTER — Ambulatory Visit (INDEPENDENT_AMBULATORY_CARE_PROVIDER_SITE_OTHER): Payer: Medicaid Other | Admitting: Obstetrics

## 2016-07-06 ENCOUNTER — Encounter: Payer: Self-pay | Admitting: Obstetrics

## 2016-07-06 ENCOUNTER — Other Ambulatory Visit: Payer: Medicaid Other

## 2016-07-06 VITALS — BP 105/65 | HR 70 | Wt 202.9 lb

## 2016-07-06 DIAGNOSIS — Z3483 Encounter for supervision of other normal pregnancy, third trimester: Secondary | ICD-10-CM

## 2016-07-06 DIAGNOSIS — Z3480 Encounter for supervision of other normal pregnancy, unspecified trimester: Secondary | ICD-10-CM

## 2016-07-06 NOTE — Progress Notes (Signed)
Pt presents for 2 gtt and ROB visit.

## 2016-07-06 NOTE — Progress Notes (Addendum)
Subjective:  Lori CzechMaryann A Lam is a 21 y.o. G2P1001 at 8627w1d being seen today for ongoing prenatal care.  She is currently monitored for the following issues for this low-risk pregnancy and has H/O chlamydia infection; Asthma; Depression; Marijuana use; and Supervision of normal pregnancy, antepartum on her problem list.  Patient reports no complaints.  Contractions: Not present. Vag. Bleeding: None.  Movement: Present. Denies leaking of fluid.   The following portions of the patient's history were reviewed and updated as appropriate: allergies, current medications, past family history, past medical history, past social history, past surgical history and problem list. Problem list updated.  Objective:   Vitals:   07/06/16 0932  BP: 105/65  Pulse: 70  Weight: 202 lb 14.4 oz (92 kg)    Fetal Status:     Movement: Present     General:  Alert, oriented and cooperative. Patient is in no acute distress.  Skin: Skin is warm and dry. No rash noted.   Cardiovascular: Normal heart rate noted  Respiratory: Normal respiratory effort, no problems with respiration noted  Abdomen: Soft, gravid, appropriate for gestational age. Pain/Pressure: Present     Pelvic:  Cervical exam deferred        Extremities: Normal range of motion.  Edema: None  Mental Status: Normal mood and affect. Normal behavior. Normal judgment and thought content.   Urinalysis:      Assessment and Plan:  Pregnancy: G2P1001 at 2827w1d Backache [facility]-Maternity Belt Rx There are no diagnoses linked to this encounter. Preterm labor symptoms and general obstetric precautions including but not limited to vaginal bleeding, contractions, leaking of fluid and fetal movement were reviewed in detail with the patient. Please refer to After Visit Summary for other counseling recommendations.  F/U in 2 weeks   Brock Badharles A Radford Pease, MDPatient ID: Lori Lam, female   DOB: 01/25/1996, 21 y.o.   MRN: [id number]

## 2016-07-07 LAB — CBC
HEMATOCRIT: 33.1 % — AB (ref 34.0–46.6)
Hemoglobin: 11.1 g/dL (ref 11.1–15.9)
MCH: 29.7 pg (ref 26.6–33.0)
MCHC: 33.5 g/dL (ref 31.5–35.7)
MCV: 89 fL (ref 79–97)
PLATELETS: 260 10*3/uL (ref 150–379)
RBC: 3.74 x10E6/uL — ABNORMAL LOW (ref 3.77–5.28)
RDW: 13.5 % (ref 12.3–15.4)
WBC: 10.1 10*3/uL (ref 3.4–10.8)

## 2016-07-07 LAB — GLUCOSE TOLERANCE, 2 HOURS W/ 1HR
Glucose, 1 hour: 76 mg/dL (ref 65–179)
Glucose, 2 hour: 62 mg/dL — ABNORMAL LOW (ref 65–152)
Glucose, Fasting: 65 mg/dL (ref 65–91)

## 2016-07-07 LAB — HIV ANTIBODY (ROUTINE TESTING W REFLEX): HIV SCREEN 4TH GENERATION: NONREACTIVE

## 2016-07-07 LAB — RPR: RPR Ser Ql: NONREACTIVE

## 2016-07-11 ENCOUNTER — Encounter: Payer: Self-pay | Admitting: *Deleted

## 2016-07-26 ENCOUNTER — Encounter (HOSPITAL_COMMUNITY): Payer: Self-pay | Admitting: *Deleted

## 2016-07-26 ENCOUNTER — Inpatient Hospital Stay (HOSPITAL_COMMUNITY)
Admission: AD | Admit: 2016-07-26 | Discharge: 2016-07-27 | Disposition: A | Discharge status: Home / Self Care | Payer: Medicaid Other | Source: Ambulatory Visit | Attending: Family Medicine | Admitting: Family Medicine

## 2016-07-26 DIAGNOSIS — Z3A31 31 weeks gestation of pregnancy: Secondary | ICD-10-CM | POA: Insufficient documentation

## 2016-07-26 DIAGNOSIS — O479 False labor, unspecified: Secondary | ICD-10-CM | POA: Diagnosis not present

## 2016-07-26 DIAGNOSIS — Z87891 Personal history of nicotine dependence: Secondary | ICD-10-CM | POA: Insufficient documentation

## 2016-07-26 DIAGNOSIS — O4703 False labor before 37 completed weeks of gestation, third trimester: Secondary | ICD-10-CM | POA: Insufficient documentation

## 2016-07-26 LAB — URINALYSIS, ROUTINE W REFLEX MICROSCOPIC
BILIRUBIN URINE: NEGATIVE
GLUCOSE, UA: NEGATIVE mg/dL
HGB URINE DIPSTICK: NEGATIVE
KETONES UR: NEGATIVE mg/dL
Leukocytes, UA: NEGATIVE
Nitrite: NEGATIVE
PH: 7 (ref 5.0–8.0)
Protein, ur: NEGATIVE mg/dL
SPECIFIC GRAVITY, URINE: 1.024 (ref 1.005–1.030)

## 2016-07-26 LAB — WET PREP, GENITAL
Clue Cells Wet Prep HPF POC: NONE SEEN
SPERM: NONE SEEN
Trich, Wet Prep: NONE SEEN
YEAST WET PREP: NONE SEEN

## 2016-07-26 LAB — FETAL FIBRONECTIN: Fetal Fibronectin: NEGATIVE

## 2016-07-26 NOTE — MAU Provider Note (Signed)
Patient Lori Lam is a 20 year olf G2P1001 at 31 weeks and 1 day here with complaints of contractions that started at 4pm. She also had some nausea and vomiting.  History     CSN: [id number]  Arrival date and time: 07/26/16 2203   First Provider Initiated Contact with Patient 07/26/16 2308      Chief Complaint  Patient presents with  . Contractions  . Nausea   Abdominal Pain  This is a new problem. The current episode started today. The problem occurs intermittently. The problem has been unchanged. The pain is located in the generalized abdominal region. The pain is at a severity of 7/10. The quality of the pain is cramping and sharp. The abdominal pain does not radiate. Associated symptoms include nausea. The pain is aggravated by movement. The pain is relieved by being still.    OB History    Gravida Para Term Preterm AB Living   2 1 1     1    SAB TAB Ectopic Multiple Live Births           1      Past Medical History:  Diagnosis Date  . Anemia   . Anxiety   . Asthma   . Chlamydia contact, treated   . GERD (gastroesophageal reflux disease)     Past Surgical History:  Procedure Laterality Date  . HERNIA REPAIR      Family History  Problem Relation Age of Onset  . Hypertension Other   . Diabetes Other   . Hypertension Maternal Grandmother   . Hypertension Maternal Grandfather   . Hypertension Paternal Grandmother     Social History  Substance Use Topics  . Smoking status: Former Smoker    Packs/day: 0.50    Types: Cigarettes    Quit date: 11/15/2015  . Smokeless tobacco: Never Used  . Alcohol use No     Comment: socially    Allergies: No Known Allergies  Prescriptions Prior to Admission  Medication Sig Dispense Refill Last Dose  . Prenatal Multivit-Min-Fe-FA (PRENATAL VITAMINS) 0.8 MG tablet Take 1 tablet by mouth daily. 30 tablet 12 07/25/2016 at Unknown time  . metroNIDAZOLE (FLAGYL) 500 MG tablet Take 1 tablet (500 mg total) by mouth 2 (two)  times daily. (Patient not taking: Reported on 07/06/2016) 14 tablet 0 Completed Course at Unknown time  . omeprazole (PRILOSEC) 20 MG capsule Take 1 capsule (20 mg total) by mouth 2 (two) times daily before a meal. (Patient not taking: Reported on 07/06/2016) 60 capsule 5 Not Taking at Unknown time    Review of Systems  Respiratory: Negative.   Gastrointestinal: Positive for abdominal pain and nausea.  Endocrine: Negative.   Genitourinary: Negative.   Musculoskeletal: Negative.   Neurological: Negative.   Psychiatric/Behavioral: Negative.    Physical Exam   Blood pressure 127/71, pulse 87, temperature 97.4 F (36.3 C), temperature source Oral, resp. rate 16, last menstrual period 12/22/2015.  Physical Exam  Constitutional: She appears well-developed.  HENT:  Head: Normocephalic.  Eyes: Pupils are equal, round, and reactive to light.  Neck: Normal range of motion.  Respiratory: Effort normal.  GI: Soft. She exhibits no distension and no mass. There is no tenderness. There is no rebound and no guarding.  Genitourinary: Vagina normal.  Genitourinary Comments: NEFG; no lesions on vaginal walls. No discharge in the vagina, no CMT, no suprapubic tenderness, no adnexal tenderness. Cervix is 1/posterior.   Musculoskeletal: Normal range of motion.  Neurological: She is alert.  Skin:  Skin is warm and dry.  Psychiatric: She has a normal mood and affect.    MAU Course  Procedures  MDM -NST: 135 bpm, present acel, neg decels, mod var; no contractions.  -Patient states that she has had 4 sharp pains/contractions since she has been in the MAU. Contractions have eased off since she has been lying down.  -ffn negative, wet prep negative -patient continues to endorse strong fetal movements.   Assessment and Plan   1. Braxton Hick's contraction    2. Patient stable for discharge; reviewed when to return to the MAu (bleeding, leaking of fluid, decreased fetal movements) 3. Reviewed the  importance of hydration and taking breaks every hour at her job to rest. If patient feels like her contractions re-occur, she is too drink some water, have a snack and take a 30 min break. If contractions continue, patient knows to return to the MAU. All questions answered.   Charlesetta Garibaldi Kooistra CNM 07/26/2016, 11:58 PM

## 2016-07-26 NOTE — MAU Note (Signed)
Patient started having abdominal pain that "feels like contractions" around 2000 tonight.  She had a BM and started feeling nauseated after that.  Continuing to have contractions every few minutes per patient.  Denies LOF or vaginal bleeding.  Reports good fetal movement.

## 2016-07-27 DIAGNOSIS — O479 False labor, unspecified: Secondary | ICD-10-CM | POA: Diagnosis not present

## 2016-07-27 NOTE — Discharge Instructions (Signed)

## 2016-07-28 ENCOUNTER — Encounter: Payer: Self-pay | Admitting: Certified Nurse Midwife

## 2016-07-28 ENCOUNTER — Ambulatory Visit (INDEPENDENT_AMBULATORY_CARE_PROVIDER_SITE_OTHER): Payer: Medicaid Other | Admitting: Certified Nurse Midwife

## 2016-07-28 DIAGNOSIS — Z3483 Encounter for supervision of other normal pregnancy, third trimester: Secondary | ICD-10-CM

## 2016-07-28 DIAGNOSIS — Z3480 Encounter for supervision of other normal pregnancy, unspecified trimester: Secondary | ICD-10-CM

## 2016-07-28 NOTE — Progress Notes (Signed)
Patient reports contractions that come and go, reports good fetal movement. 

## 2016-07-28 NOTE — Progress Notes (Signed)
   PRENATAL VISIT NOTE  Subjective:  Lori Lam is a 21 y.o. G2P1001 at 6251w2d being seen today for ongoing prenatal care.  She is currently monitored for the following issues for this low-risk pregnancy and has H/O chlamydia infection; Asthma; Depression; Marijuana use; and Supervision of normal pregnancy, antepartum on her problem list.  Patient reports no complaints.  Contractions: Irregular. Vag. Bleeding: None.  Movement: Present. Denies leaking of fluid.   The following portions of the patient's history were reviewed and updated as appropriate: allergies, current medications, past family history, past medical history, past social history, past surgical history and problem list. Problem list updated.  Objective:   Vitals:   07/28/16 1611  BP: 118/78  Pulse: 80  Weight: 202 lb 3.2 oz (91.7 kg)    Fetal Status: Fetal Heart Rate (bpm): 145 Fundal Height: 145 cm Movement: Present     General:  Alert, oriented and cooperative. Patient is in no acute distress.  Skin: Skin is warm and dry. No rash noted.   Cardiovascular: Normal heart rate noted  Respiratory: Normal respiratory effort, no problems with respiration noted  Abdomen: Soft, gravid, appropriate for gestational age. Pain/Pressure: Present     Pelvic:  Cervical exam deferred        Extremities: Normal range of motion.  Edema: Trace  Mental Status: Normal mood and affect. Normal behavior. Normal judgment and thought content.   Assessment and Plan:  Pregnancy: G2P1001 at 5151w2d  1. Supervision of other normal pregnancy, antepartum     Doing well. TDaP and preterm labor symptoms discussed.   Declines TDaP today.    Preterm labor symptoms and general obstetric precautions including but not limited to vaginal bleeding, contractions, leaking of fluid and fetal movement were reviewed in detail with the patient. Please refer to After Visit Summary for other counseling recommendations.  Return in about 2 weeks (around  08/11/2016) for ROB.   Roe Coombsachelle A Rayley Gao, CNM

## 2016-08-01 ENCOUNTER — Encounter: Payer: Medicaid Other | Admitting: Obstetrics & Gynecology

## 2016-08-16 ENCOUNTER — Ambulatory Visit (INDEPENDENT_AMBULATORY_CARE_PROVIDER_SITE_OTHER): Payer: Medicaid Other | Admitting: Certified Nurse Midwife

## 2016-08-16 ENCOUNTER — Encounter: Payer: Self-pay | Admitting: Certified Nurse Midwife

## 2016-08-16 VITALS — BP 127/71 | HR 85 | Wt 207.0 lb

## 2016-08-16 DIAGNOSIS — Z3480 Encounter for supervision of other normal pregnancy, unspecified trimester: Secondary | ICD-10-CM

## 2016-08-16 DIAGNOSIS — Z3483 Encounter for supervision of other normal pregnancy, third trimester: Secondary | ICD-10-CM

## 2016-08-16 NOTE — Progress Notes (Signed)
   PRENATAL VISIT NOTE  Subjective:  Lori Lam is a 21 y.o. G2P1001 at [redacted]w[redacted]d being seen today for ongoing prenatal care.  She is currently monitored for the following issues for this low-risk pregnancy and has H/O chlamydia infection; Asthma; Depression; Marijuana use; and Supervision of normal pregnancy, antepartum on her problem list.  Patient reports no complaints.  Contractions: Not present. Vag. Bleeding: None.  Movement: Present. Denies leaking of fluid.   The following portions of the patient's history were reviewed and updated as appropriate: allergies, current medications, past family history, past medical history, past social history, past surgical history and problem list. Problem list updated.  Objective:   Vitals:   08/16/16 1606  BP: 127/71  Pulse: 85  Weight: 207 lb (93.9 kg)    Fetal Status: Fetal Heart Rate (bpm): 142 Fundal Height: 34 cm Movement: Present     General:  Alert, oriented and cooperative. Patient is in no acute distress.  Skin: Skin is warm and dry. No rash noted.   Cardiovascular: Normal heart rate noted  Respiratory: Normal respiratory effort, no problems with respiration noted  Abdomen: Soft, gravid, appropriate for gestational age. Pain/Pressure: Present     Pelvic:  Cervical exam deferred        Extremities: Normal range of motion.  Edema: Trace  Mental Status: Normal mood and affect. Normal behavior. Normal judgment and thought content.   Assessment and Plan:  Pregnancy: G2P1001 at [redacted]w[redacted]d  1. Supervision of other normal pregnancy, antepartum     Doing well.  Does have some anxiety of feeling overwhelmed, declines SW, counseling.  Denies any homicidal/suicidal thoughts.   Preterm labor symptoms and general obstetric precautions including but not limited to vaginal bleeding, contractions, leaking of fluid and fetal movement were reviewed in detail with the patient. Please refer to After Visit Summary for other counseling recommendations.   Return in about 1 week (around 08/23/2016) for ROB, GBS.   Roe Coombs, CNM

## 2016-08-30 ENCOUNTER — Other Ambulatory Visit (HOSPITAL_COMMUNITY)
Admission: RE | Admit: 2016-08-30 | Discharge: 2016-08-30 | Disposition: A | Discharge status: Home / Self Care | Payer: Medicaid Other | Source: Ambulatory Visit | Attending: Certified Nurse Midwife | Admitting: Certified Nurse Midwife

## 2016-08-30 ENCOUNTER — Ambulatory Visit (INDEPENDENT_AMBULATORY_CARE_PROVIDER_SITE_OTHER): Payer: Medicaid Other | Admitting: Certified Nurse Midwife

## 2016-08-30 VITALS — BP 119/77 | HR 101 | Wt 206.0 lb

## 2016-08-30 DIAGNOSIS — Z3480 Encounter for supervision of other normal pregnancy, unspecified trimester: Secondary | ICD-10-CM | POA: Insufficient documentation

## 2016-08-30 DIAGNOSIS — Z3483 Encounter for supervision of other normal pregnancy, third trimester: Secondary | ICD-10-CM

## 2016-08-30 LAB — OB RESULTS CONSOLE GBS: STREP GROUP B AG: POSITIVE

## 2016-08-30 NOTE — Progress Notes (Signed)
   PRENATAL VISIT NOTE  Subjective:  Lori Lam is a 21 y.o. G2P1001 at [redacted]w[redacted]d being seen today for ongoing prenatal care.  She is currently monitored for the following issues for this low-risk pregnancy and has H/O chlamydia infection; Asthma; Depression; Marijuana use; and Supervision of normal pregnancy, antepartum on her problem list.  Patient reports pelvic pressure and discomfort .  Contractions: Irregular. Vag. Bleeding: None.  Movement: Present. Denies leaking of fluid.   The following portions of the patient's history were reviewed and updated as appropriate: allergies, current medications, past family history, past medical history, past social history, past surgical history and problem list. Problem list updated.  Objective:   Vitals:   08/30/16 1603  BP: 119/77  Pulse: (!) 101  Weight: 206 lb (93.4 kg)    Fetal Status:     Movement: Present     General:  Alert, oriented and cooperative. Patient is in no acute distress.  Skin: Skin is warm and dry. No rash noted.   Cardiovascular: Normal heart rate noted  Respiratory: Normal respiratory effort, no problems with respiration noted  Abdomen: Soft, gravid, appropriate for gestational age. Pain/Pressure: Present     Pelvic:  Cervical exam performed        Extremities: Normal range of motion.     Mental Status: Normal mood and affect. Normal behavior. Normal judgment and thought content.   Assessment and Plan:  Pregnancy: G2P1001 at [redacted]w[redacted]d  1. Supervision of other normal pregnancy, antepartum - Strep Gp B NAA - Cervicovaginal ancillary only  Preterm labor symptoms and general obstetric precautions including but not limited to vaginal bleeding, contractions, leaking of fluid and fetal movement were reviewed in detail with the patient. Please refer to After Visit Summary for other counseling recommendations.  1 wk/ Follow-up ROB   Gidget Quizhpi K Talar Fraley, Student-MidWife

## 2016-08-30 NOTE — Progress Notes (Signed)
Pt states she is having increase in pelvic pain/pressure, lower back pain.

## 2016-08-30 NOTE — Progress Notes (Signed)
   PRENATAL VISIT NOTE  Subjective:  Lori Lam is a 21 y.o. G2P1001 at [redacted]w[redacted]d being seen today for ongoing prenatal care.  She is currently monitored for the following issues for this low-risk pregnancy and has H/O chlamydia infection; Asthma; Depression; Marijuana use; and Supervision of normal pregnancy, antepartum on her problem list.  Patient reports no bleeding, no leaking and occasional contractions.  Contractions: Irregular. Vag. Bleeding: None.  Movement: Present. Denies leaking of fluid.   The following portions of the patient's history were reviewed and updated as appropriate: allergies, current medications, past family history, past medical history, past social history, past surgical history and problem list. Problem list updated.  Objective:   Vitals:   08/30/16 1603  BP: 119/77  Pulse: (!) 101  Weight: 206 lb (93.4 kg)    Fetal Status: Fetal Heart Rate (bpm): 141 Fundal Height: 36 cm Movement: Present     General:  Alert, oriented and cooperative. Patient is in no acute distress.  Skin: Skin is warm and dry. No rash noted.   Cardiovascular: Normal heart rate noted  Respiratory: Normal respiratory effort, no problems with respiration noted  Abdomen: Soft, gravid, appropriate for gestational age. Pain/Pressure: Present     Pelvic:  Cervical exam performed Dilation: 1 Effacement (%): Thick Station: Ballotable  Extremities: Normal range of motion.     Mental Status: Normal mood and affect. Normal behavior. Normal judgment and thought content.   Assessment and Plan:  Pregnancy: G2P1001 at [redacted]w[redacted]d  1. Supervision of other normal pregnancy, antepartum     Normal discomforts of pregnancy noted, doing well.  - Strep Gp B NAA - Cervicovaginal ancillary only  Preterm labor symptoms and general obstetric precautions including but not limited to vaginal bleeding, contractions, leaking of fluid and fetal movement were reviewed in detail with the patient. Please refer to  After Visit Summary for other counseling recommendations.  Return in about 1 week (around 09/06/2016) for ROB.   Roe Coombs, CNM

## 2016-08-31 LAB — CERVICOVAGINAL ANCILLARY ONLY
Bacterial vaginitis: NEGATIVE
CHLAMYDIA, DNA PROBE: NEGATIVE
Candida vaginitis: NEGATIVE
NEISSERIA GONORRHEA: NEGATIVE
Trichomonas: NEGATIVE

## 2016-09-01 ENCOUNTER — Other Ambulatory Visit: Payer: Self-pay | Admitting: Certified Nurse Midwife

## 2016-09-01 DIAGNOSIS — Z3480 Encounter for supervision of other normal pregnancy, unspecified trimester: Secondary | ICD-10-CM

## 2016-09-01 DIAGNOSIS — O99820 Streptococcus B carrier state complicating pregnancy: Secondary | ICD-10-CM | POA: Insufficient documentation

## 2016-09-01 LAB — STREP GP B NAA: Strep Gp B NAA: POSITIVE — AB

## 2016-09-06 ENCOUNTER — Ambulatory Visit (INDEPENDENT_AMBULATORY_CARE_PROVIDER_SITE_OTHER): Payer: Medicaid Other | Admitting: Certified Nurse Midwife

## 2016-09-06 VITALS — BP 118/77 | HR 87 | Wt 209.2 lb

## 2016-09-06 DIAGNOSIS — Z3483 Encounter for supervision of other normal pregnancy, third trimester: Secondary | ICD-10-CM

## 2016-09-06 DIAGNOSIS — Z3480 Encounter for supervision of other normal pregnancy, unspecified trimester: Secondary | ICD-10-CM

## 2016-09-06 DIAGNOSIS — O99820 Streptococcus B carrier state complicating pregnancy: Secondary | ICD-10-CM

## 2016-09-06 DIAGNOSIS — Z8619 Personal history of other infectious and parasitic diseases: Secondary | ICD-10-CM

## 2016-09-06 NOTE — Patient Instructions (Signed)
Contraception Choices Contraception (birth control) is the use of any methods or devices to prevent pregnancy. Below are some methods to help avoid pregnancy. Hormonal methods  Contraceptive implant. This is a thin, plastic tube containing progesterone hormone. It does not contain estrogen hormone. Your health care provider inserts the tube in the inner part of the upper arm. The tube can remain in place for up to 3 years. After 3 years, the implant must be removed. The implant prevents the ovaries from releasing an egg (ovulation), thickens the cervical mucus to prevent sperm from entering the uterus, and thins the lining of the inside of the uterus.  Progesterone-only injections. These injections are given every 3 months by your health care provider to prevent pregnancy. This synthetic progesterone hormone stops the ovaries from releasing eggs. It also thickens cervical mucus and changes the uterine lining. This makes it harder for sperm to survive in the uterus.  Birth control pills. These pills contain estrogen and progesterone hormone. They work by preventing the ovaries from releasing eggs (ovulation). They also cause the cervical mucus to thicken, preventing the sperm from entering the uterus. Birth control pills are prescribed by a health care provider.Birth control pills can also be used to treat heavy periods.  Minipill. This type of birth control pill contains only the progesterone hormone. They are taken every day of each month and must be prescribed by your health care provider.  Birth control patch. The patch contains hormones similar to those in birth control pills. It must be changed once a week and is prescribed by a health care provider.  Vaginal ring. The ring contains hormones similar to those in birth control pills. It is left in the vagina for 3 weeks, removed for 1 week, and then a new one is put back in place. The patient must be comfortable inserting and removing the ring from  the vagina.A health care provider's prescription is necessary.  Emergency contraception. Emergency contraceptives prevent pregnancy after unprotected sexual intercourse. This pill can be taken right after sex or up to 5 days after unprotected sex. It is most effective the sooner you take the pills after having sexual intercourse. Most emergency contraceptive pills are available without a prescription. Check with your pharmacist. Do not use emergency contraception as your only form of birth control. Barrier methods  Female condom. This is a thin sheath (latex or rubber) that is worn over the penis during sexual intercourse. It can be used with spermicide to increase effectiveness.  Female condom. This is a soft, loose-fitting sheath that is put into the vagina before sexual intercourse.  Diaphragm. This is a soft, latex, dome-shaped barrier that must be fitted by a health care provider. It is inserted into the vagina, along with a spermicidal jelly. It is inserted before intercourse. The diaphragm should be left in the vagina for 6 to 8 hours after intercourse.  Cervical cap. This is a round, soft, latex or plastic cup that fits over the cervix and must be fitted by a health care provider. The cap can be left in place for up to 48 hours after intercourse.  Sponge. This is a soft, circular piece of polyurethane foam. The sponge has spermicide in it. It is inserted into the vagina after wetting it and before sexual intercourse.  Spermicides. These are chemicals that kill or block sperm from entering the cervix and uterus. They come in the form of creams, jellies, suppositories, foam, or tablets. They do not require a prescription. They   are inserted into the vagina with an applicator before having sexual intercourse. The process must be repeated every time you have sexual intercourse. Intrauterine contraception  Intrauterine device (IUD). This is a T-shaped device that is put in a woman's uterus during  a menstrual period to prevent pregnancy. There are 2 types: ? Copper IUD. This type of IUD is wrapped in copper wire and is placed inside the uterus. Copper makes the uterus and fallopian tubes produce a fluid that kills sperm. It can stay in place for 10 years. ? Hormone IUD. This type of IUD contains the hormone progestin (synthetic progesterone). The hormone thickens the cervical mucus and prevents sperm from entering the uterus, and it also thins the uterine lining to prevent implantation of a fertilized egg. The hormone can weaken or kill the sperm that get into the uterus. It can stay in place for 3-5 years, depending on which type of IUD is used. Permanent methods of contraception  Female tubal ligation. This is when the woman's fallopian tubes are surgically sealed, tied, or blocked to prevent the egg from traveling to the uterus.  Hysteroscopic sterilization. This involves placing a small coil or insert into each fallopian tube. Your doctor uses a technique called hysteroscopy to do the procedure. The device causes scar tissue to form. This results in permanent blockage of the fallopian tubes, so the sperm cannot fertilize the egg. It takes about 3 months after the procedure for the tubes to become blocked. You must use another form of birth control for these 3 months.  Female sterilization. This is when the female has the tubes that carry sperm tied off (vasectomy).This blocks sperm from entering the vagina during sexual intercourse. After the procedure, the man can still ejaculate fluid (semen). Natural planning methods  Natural family planning. This is not having sexual intercourse or using a barrier method (condom, diaphragm, cervical cap) on days the woman could become pregnant.  Calendar method. This is keeping track of the length of each menstrual cycle and identifying when you are fertile.  Ovulation method. This is avoiding sexual intercourse during ovulation.  Symptothermal method.  This is avoiding sexual intercourse during ovulation, using a thermometer and ovulation symptoms.  Post-ovulation method. This is timing sexual intercourse after you have ovulated. Regardless of which type or method of contraception you choose, it is important that you use condoms to protect against the transmission of sexually transmitted infections (STIs). Talk with your health care provider about which form of contraception is most appropriate for you. This information is not intended to replace advice given to you by your health care provider. Make sure you discuss any questions you have with your health care provider. Document Released: 04/18/2005 Document Revised: 09/24/2015 Document Reviewed: 10/11/2012 Elsevier Interactive Patient Education  2017 [employer]  

## 2016-09-06 NOTE — Progress Notes (Signed)
   PRENATAL VISIT NOTE  Subjective:  Lori Lam is a 21 y.o. G2P1001 at 5251w0d being seen today for ongoing prenatal care.  She is currently monitored for the following issues for this low-risk pregnancy and has H/O chlamydia infection; Asthma; Depression; Marijuana use; Supervision of normal pregnancy, antepartum; and GBS (group B Streptococcus carrier), +RV culture, currently pregnant on her problem list.  Patient reports no complaints.  Contractions: Irregular. Vag. Bleeding: None.  Movement: Present. Denies leaking of fluid.   The following portions of the patient's history were reviewed and updated as appropriate: allergies, current medications, past family history, past medical history, past social history, past surgical history and problem list. Problem list updated.  Objective:   Vitals:   09/06/16 1542  BP: 118/77  Pulse: 87  Weight: 209 lb 3.2 oz (94.9 kg)    Fetal Status: Fetal Heart Rate (bpm): 143 Fundal Height: 37 cm Movement: Present     General:  Alert, oriented and cooperative. Patient is in no acute distress.  Skin: Skin is warm and dry. No rash noted.   Cardiovascular: Normal heart rate noted  Respiratory: Normal respiratory effort, no problems with respiration noted  Abdomen: Soft, gravid, appropriate for gestational age. Pain/Pressure: Present     Pelvic:  Cervical exam deferred        Extremities: Normal range of motion.  Edema: Trace  Mental Status: Normal mood and affect. Normal behavior. Normal judgment and thought content.   Assessment and Plan:  Pregnancy: G2P1001 at 3151w0d  1. Supervision of other normal pregnancy, antepartum Patient reports doing well.  2. H/O chlamydia infection Patient negative 08/30/2016.   3. GBS (group B Streptococcus carrier), +RV culture, currently pregnant Penicillin before labor   Term labor symptoms and general obstetric precautions including but not limited to vaginal bleeding, contractions, leaking of fluid and  fetal movement were reviewed in detail with the patient. Please refer to After Visit Summary for other counseling recommendations.  1 week ROB.    Roe Coombsenney, Rachelle A, CNM'

## 2016-09-06 NOTE — Progress Notes (Signed)
Patient is having nerve pain in her vagina- other than that- she is not having symptoms of labor.

## 2016-09-07 ENCOUNTER — Encounter: Payer: Self-pay | Admitting: Certified Nurse Midwife

## 2016-09-13 ENCOUNTER — Encounter: Payer: Self-pay | Admitting: Certified Nurse Midwife

## 2016-09-13 ENCOUNTER — Ambulatory Visit (INDEPENDENT_AMBULATORY_CARE_PROVIDER_SITE_OTHER): Payer: Medicaid Other | Admitting: Certified Nurse Midwife

## 2016-09-13 VITALS — BP 119/75 | HR 85 | Wt 211.2 lb

## 2016-09-13 DIAGNOSIS — Z8619 Personal history of other infectious and parasitic diseases: Secondary | ICD-10-CM

## 2016-09-13 DIAGNOSIS — Z3483 Encounter for supervision of other normal pregnancy, third trimester: Secondary | ICD-10-CM

## 2016-09-13 DIAGNOSIS — Z3480 Encounter for supervision of other normal pregnancy, unspecified trimester: Secondary | ICD-10-CM

## 2016-09-13 NOTE — Progress Notes (Signed)
   PRENATAL VISIT NOTE  Subjective:  Lori Lam is a 21 y.o. G2P1001 at 219w0d being seen today for ongoing prenatal care.  She is currently monitored for the following issues for this low-risk pregnancy and has H/O chlamydia infection; Asthma; Depression; Marijuana use; Supervision of normal pregnancy, antepartum; and GBS (group B Streptococcus carrier), +RV culture, currently pregnant on her problem list.  Patient reports no complaints.  Contractions: Irregular. Vag. Bleeding: None.  Movement: Present. Denies leaking of fluid.   The following portions of the patient's history were reviewed and updated as appropriate: allergies, current medications, past family history, past medical history, past social history, past surgical history and problem list. Problem list updated.  Objective:   Vitals:   09/13/16 1438  BP: 119/75  Pulse: 85  Weight: 211 lb 3.2 oz (95.8 kg)    Fetal Status: Fetal Heart Rate (bpm): 151 Fundal Height: 38 cm Movement: Present  Presentation: Vertex  General:  Alert, oriented and cooperative. Patient is in no acute distress.  Skin: Skin is warm and dry. No rash noted.   Cardiovascular: Normal heart rate noted  Respiratory: Normal respiratory effort, no problems with respiration noted  Abdomen: Soft, gravid, appropriate for gestational age. Pain/Pressure: Absent     Pelvic:  Cervical exam performed Dilation: 1 Effacement (%): Thick Station: -3  Extremities: Normal range of motion.  Edema: Trace  Mental Status: Normal mood and affect. Normal behavior. Normal judgment and thought content.   Assessment and Plan:  Pregnancy: G2P1001 at 1919w0d  1. Supervision of other normal pregnancy, antepartum     Doing well  2. H/O chlamydia infection     TOC negative.   Term labor symptoms and general obstetric precautions including but not limited to vaginal bleeding, contractions, leaking of fluid and fetal movement were reviewed in detail with the patient. Please  refer to After Visit Summary for other counseling recommendations.  Return in about 1 week (around 09/20/2016) for ROB.   Roe Coombsenney, Lailoni Baquera A, CNM

## 2016-09-13 NOTE — Progress Notes (Signed)
Patient reports good fetal movement and contractions that come and go. 

## 2016-09-20 ENCOUNTER — Ambulatory Visit (INDEPENDENT_AMBULATORY_CARE_PROVIDER_SITE_OTHER): Payer: Medicaid Other | Admitting: Certified Nurse Midwife

## 2016-09-20 ENCOUNTER — Encounter: Payer: Self-pay | Admitting: Certified Nurse Midwife

## 2016-09-20 VITALS — BP 112/75 | HR 96 | Wt 211.0 lb

## 2016-09-20 DIAGNOSIS — O99820 Streptococcus B carrier state complicating pregnancy: Secondary | ICD-10-CM

## 2016-09-20 DIAGNOSIS — Z3483 Encounter for supervision of other normal pregnancy, third trimester: Secondary | ICD-10-CM

## 2016-09-20 DIAGNOSIS — Z3480 Encounter for supervision of other normal pregnancy, unspecified trimester: Secondary | ICD-10-CM

## 2016-09-20 NOTE — Progress Notes (Signed)
Pt would like cervical exam today. 

## 2016-09-20 NOTE — Progress Notes (Signed)
   PRENATAL VISIT NOTE  Subjective:  Lori CzechMaryann A Lam is a 21 y.o. G2P1001 at [redacted]w[redacted]d being seen today for ongoing prenatal care.  She is currently monitored for the following issues for this low-risk pregnancy and has H/O chlamydia infection; Asthma; Depression; Marijuana use; Supervision of normal pregnancy, antepartum; and GBS (group B Streptococcus carrier), +RV culture, currently pregnant on her problem list.  Patient reports no complaints.  Contractions: Irregular. Vag. Bleeding: None.  Movement: Present. Denies leaking of fluid.   The following portions of the patient's history were reviewed and updated as appropriate: allergies, current medications, past family history, past medical history, past social history, past surgical history and problem list. Problem list updated.  Objective:   Vitals:   09/20/16 1449  BP: 112/75  Pulse: 96  Weight: 211 lb (95.7 kg)    Fetal Status: Fetal Heart Rate (bpm): 140 Fundal Height: 39 cm Movement: Present  Presentation: Vertex  General:  Alert, oriented and cooperative. Patient is in no acute distress.  Skin: Skin is warm and dry. No rash noted.   Cardiovascular: Normal heart rate noted  Respiratory: Normal respiratory effort, no problems with respiration noted  Abdomen: Soft, gravid, appropriate for gestational age. Pain/Pressure: Present     Pelvic:  Cervical exam performed Dilation: 1 Effacement (%): Thick Station: -3  Extremities: Normal range of motion.     Mental Status: Normal mood and affect. Normal behavior. Normal judgment and thought content.   Assessment and Plan:  Pregnancy: G2P1001 at [redacted]w[redacted]d  1. Supervision of other normal pregnancy, antepartum     Doing well. IOL scheduled for 41 weeks.   2. GBS (group B Streptococcus carrier), +RV culture, currently pregnant     PCN for labor/delivery  Term labor symptoms and general obstetric precautions including but not limited to vaginal bleeding, contractions, leaking of fluid and  fetal movement were reviewed in detail with the patient. Please refer to After Visit Summary for other counseling recommendations.  Return in about 1 week (around 09/27/2016) for NST, ROB.   Roe Coombsachelle A Denney, CNM

## 2016-09-24 ENCOUNTER — Inpatient Hospital Stay (HOSPITAL_COMMUNITY)
Admission: AD | Admit: 2016-09-24 | Discharge: 2016-09-27 | DRG: 775 | Disposition: A | Discharge status: Home / Self Care | Payer: Medicaid Other | Source: Ambulatory Visit | Attending: Obstetrics and Gynecology | Admitting: Obstetrics and Gynecology

## 2016-09-24 ENCOUNTER — Encounter (HOSPITAL_COMMUNITY): Payer: Self-pay | Admitting: *Deleted

## 2016-09-24 ENCOUNTER — Inpatient Hospital Stay (HOSPITAL_COMMUNITY): Payer: Medicaid Other | Admitting: Anesthesiology

## 2016-09-24 DIAGNOSIS — K219 Gastro-esophageal reflux disease without esophagitis: Secondary | ICD-10-CM | POA: Diagnosis present

## 2016-09-24 DIAGNOSIS — O9962 Diseases of the digestive system complicating childbirth: Secondary | ICD-10-CM | POA: Diagnosis present

## 2016-09-24 DIAGNOSIS — O99824 Streptococcus B carrier state complicating childbirth: Secondary | ICD-10-CM | POA: Diagnosis present

## 2016-09-24 DIAGNOSIS — O4202 Full-term premature rupture of membranes, onset of labor within 24 hours of rupture: Principal | ICD-10-CM | POA: Diagnosis present

## 2016-09-24 DIAGNOSIS — Z833 Family history of diabetes mellitus: Secondary | ICD-10-CM

## 2016-09-24 DIAGNOSIS — F329 Major depressive disorder, single episode, unspecified: Secondary | ICD-10-CM | POA: Diagnosis present

## 2016-09-24 DIAGNOSIS — Z3A39 39 weeks gestation of pregnancy: Secondary | ICD-10-CM

## 2016-09-24 DIAGNOSIS — Z87891 Personal history of nicotine dependence: Secondary | ICD-10-CM | POA: Diagnosis not present

## 2016-09-24 DIAGNOSIS — Z8249 Family history of ischemic heart disease and other diseases of the circulatory system: Secondary | ICD-10-CM | POA: Diagnosis not present

## 2016-09-24 DIAGNOSIS — O99344 Other mental disorders complicating childbirth: Secondary | ICD-10-CM | POA: Diagnosis present

## 2016-09-24 LAB — CBC
HEMATOCRIT: 34.5 % — AB (ref 36.0–46.0)
Hemoglobin: 11.5 g/dL — ABNORMAL LOW (ref 12.0–15.0)
MCH: 29.9 pg (ref 26.0–34.0)
MCHC: 33.3 g/dL (ref 30.0–36.0)
MCV: 89.8 fL (ref 78.0–100.0)
Platelets: 207 10*3/uL (ref 150–400)
RBC: 3.84 MIL/uL — ABNORMAL LOW (ref 3.87–5.11)
RDW: 14.2 % (ref 11.5–15.5)
WBC: 11.2 10*3/uL — AB (ref 4.0–10.5)

## 2016-09-24 LAB — POCT FERN TEST: POCT Fern Test: POSITIVE

## 2016-09-24 LAB — TYPE AND SCREEN
ABO/RH(D): B POS
Antibody Screen: NEGATIVE

## 2016-09-24 MED ORDER — OXYCODONE-ACETAMINOPHEN 5-325 MG PO TABS: 2.0000 | ORAL_TABLET | ORAL | Status: DC | PRN

## 2016-09-24 MED ORDER — OXYTOCIN 40 UNITS IN LACTATED RINGERS INFUSION - SIMPLE MED
1.0000 m[IU]/min | INTRAVENOUS | Status: DC
  Administered 2016-09-24: 1 m[IU]/min via INTRAVENOUS
  Filled 2016-09-24: qty 1000

## 2016-09-24 MED ORDER — OXYCODONE-ACETAMINOPHEN 5-325 MG PO TABS: 1.0000 | ORAL_TABLET | ORAL | Status: DC | PRN

## 2016-09-24 MED ORDER — EPHEDRINE 5 MG/ML INJ
10.0000 mg | INTRAVENOUS | Status: DC | PRN
  Filled 2016-09-24: qty 2

## 2016-09-24 MED ORDER — OXYTOCIN 40 UNITS IN LACTATED RINGERS INFUSION - SIMPLE MED
2.5000 [IU]/h | INTRAVENOUS | Status: DC
  Administered 2016-09-25: 2.5 [IU]/h via INTRAVENOUS

## 2016-09-24 MED ORDER — TERBUTALINE SULFATE 1 MG/ML IJ SOLN
0.2500 mg | Freq: Once | INTRAMUSCULAR | Status: DC | PRN
  Filled 2016-09-24: qty 1

## 2016-09-24 MED ORDER — MISOPROSTOL 200 MCG PO TABS
50.0000 ug | ORAL_TABLET | ORAL | Status: DC
  Administered 2016-09-24: 50 ug via ORAL
  Filled 2016-09-24: qty 1

## 2016-09-24 MED ORDER — PENICILLIN G POT IN DEXTROSE 60000 UNIT/ML IV SOLN
3.0000 10*6.[IU] | INTRAVENOUS | Status: DC
  Administered 2016-09-24 (×3): 3 10*6.[IU] via INTRAVENOUS
  Filled 2016-09-24 (×7): qty 50

## 2016-09-24 MED ORDER — ACETAMINOPHEN 325 MG PO TABS: 650.0000 mg | ORAL_TABLET | ORAL | Status: DC | PRN

## 2016-09-24 MED ORDER — LACTATED RINGERS IV SOLN
INTRAVENOUS | Status: DC
  Administered 2016-09-24 (×2): via INTRAVENOUS

## 2016-09-24 MED ORDER — FLEET ENEMA 7-19 GM/118ML RE ENEM: 1.0000 | ENEMA | RECTAL | Status: DC | PRN

## 2016-09-24 MED ORDER — LACTATED RINGERS IV SOLN
500.0000 mL | Freq: Once | INTRAVENOUS | Status: AC
  Administered 2016-09-24: 21:00:00 via INTRAVENOUS

## 2016-09-24 MED ORDER — PHENYLEPHRINE 40 MCG/ML (10ML) SYRINGE FOR IV PUSH (FOR BLOOD PRESSURE SUPPORT)
80.0000 ug | PREFILLED_SYRINGE | INTRAVENOUS | Status: DC | PRN
  Filled 2016-09-24: qty 5

## 2016-09-24 MED ORDER — LIDOCAINE HCL (PF) 1 % IJ SOLN
INTRAMUSCULAR | Status: DC | PRN
  Administered 2016-09-24 (×2): 7 mL via EPIDURAL

## 2016-09-24 MED ORDER — LIDOCAINE HCL (PF) 1 % IJ SOLN
30.0000 mL | INTRAMUSCULAR | Status: DC | PRN
  Filled 2016-09-24: qty 30

## 2016-09-24 MED ORDER — OXYTOCIN BOLUS FROM INFUSION
500.0000 mL | Freq: Once | INTRAVENOUS | Status: AC
  Administered 2016-09-25: 500 mL via INTRAVENOUS

## 2016-09-24 MED ORDER — DIPHENHYDRAMINE HCL 50 MG/ML IJ SOLN: 12.5000 mg | INTRAMUSCULAR | Status: DC | PRN

## 2016-09-24 MED ORDER — LACTATED RINGERS IV SOLN: 500.0000 mL | INTRAVENOUS | Status: DC | PRN

## 2016-09-24 MED ORDER — ONDANSETRON HCL 4 MG/2ML IJ SOLN: 4.0000 mg | Freq: Four times a day (QID) | INTRAMUSCULAR | Status: DC | PRN

## 2016-09-24 MED ORDER — PHENYLEPHRINE 40 MCG/ML (10ML) SYRINGE FOR IV PUSH (FOR BLOOD PRESSURE SUPPORT)
80.0000 ug | PREFILLED_SYRINGE | INTRAVENOUS | Status: DC | PRN
  Filled 2016-09-24: qty 10
  Filled 2016-09-24: qty 5

## 2016-09-24 MED ORDER — FENTANYL CITRATE (PF) 100 MCG/2ML IJ SOLN
50.0000 ug | INTRAMUSCULAR | Status: DC | PRN
  Administered 2016-09-24 (×3): 100 ug via INTRAVENOUS
  Filled 2016-09-24 (×3): qty 2

## 2016-09-24 MED ORDER — PENICILLIN G POTASSIUM 5000000 UNITS IJ SOLR
5.0000 10*6.[IU] | Freq: Once | INTRAVENOUS | Status: AC
  Administered 2016-09-24: 5 10*6.[IU] via INTRAVENOUS
  Filled 2016-09-24: qty 5

## 2016-09-24 MED ORDER — SOD CITRATE-CITRIC ACID 500-334 MG/5ML PO SOLN: 30.0000 mL | ORAL | Status: DC | PRN

## 2016-09-24 MED ORDER — FENTANYL 2.5 MCG/ML BUPIVACAINE 1/10 % EPIDURAL INFUSION (WH - ANES)
14.0000 mL/h | INTRAMUSCULAR | Status: DC | PRN
  Administered 2016-09-24: 14 mL/h via EPIDURAL
  Filled 2016-09-24: qty 100

## 2016-09-24 NOTE — Progress Notes (Signed)
Subjective: Patient feeling occasional contractions.   Objective: BP 114/71 (BP Location: Right Arm)   Pulse 82   Temp 98.5 F (36.9 C) (Oral)   Resp 20   Ht 5\' 4"  (1.626 m)   Wt 211 lb (95.7 kg)   LMP 12/22/2015 (Exact Date)   SpO2 100%   BMI 36.22 kg/m  No intake/output data recorded.  FHT:  FHR: 135 bpm, variability: moderate,  accelerations:  Present,  decelerations:  Absent UC:   regular, every 2-4 minutes  SVE:   Dilation: 1.5 Effacement (%): 70 Station: -3 Exam by:: [employer] CNM  Labs: Lab Results  Component Value Date   WBC 11.2 (H) 09/24/2016   HGB 11.5 (L) 09/24/2016   HCT 34.5 (L) 09/24/2016   MCV 89.8 09/24/2016   PLT 207 09/24/2016    Assessment / Plan: IUP at term. SROM. GBS pos  Foley bulb placed by K. Booker CNM. Patient tolerated procedure well.  Plan: Wait for foley to fall out and start pitocin after. IV pain medication.   Cleone SlimCaroline Murat Rideout SNM 09/24/2016, 5:52 PM

## 2016-09-24 NOTE — Progress Notes (Signed)
I confirm that I have verified the information documented in the medical student's note and that I have also personally reperformed the physical exam and all medical decision making activities.  Raelyn MoraRolitta Kamyah Wilhelmsen, CNM 09/24/2016 11:32 PM   Labor Progress Note  Verna CzechMaryann A Cadena is a 21 y.o. G2P1001 at 8878w4d  admitted for PROM  S: Foley bulb fell out. Feeling some pain with contractions, would like to have her epidural placed before starting pitocin  O:  BP 135/75   Pulse 68   Temp 97.9 F (36.6 C) (Oral)   Resp 18   Ht 5\' 4"  (1.626 m)   Wt 95.7 kg (211 lb)   LMP 12/22/2015 (Exact Date)   SpO2 100%   BMI 36.22 kg/m     FHT:  FHR: 140 bpm, variability: moderate,  accelerations:  Present,  decelerations:  Absent UC:   irregular SVE:   Dilation: 4 Effacement (%): 60 Station: -2 Exam by:: Sade Harper,RN  Labs: Lab Results  Component Value Date   WBC 11.2 (H) 09/24/2016   HGB 11.5 (L) 09/24/2016   HCT 34.5 (L) 09/24/2016   MCV 89.8 09/24/2016   PLT 207 09/24/2016    Assessment / Plan: 21 y.o. G2P1001 6878w4d in active labor Spontaneous labor, progressing normally  Labor: Progressing normally Fetal Wellbeing:  Category I Pain Control:  Currently none, will place epidural at patient's request Anticipated MOD:  NSVD  Active management  - Low-dose Pitocin GBS+, PCN q4h  Ivan AnchorsJohn Adeolu [facility] Hospital[facility] Medical Student 09/24/2016 9:17 PM

## 2016-09-24 NOTE — MAU Note (Signed)
Patient presents with leaking amniotic fluid since about 30 minutes ago. [facility]

## 2016-09-24 NOTE — Anesthesia Preprocedure Evaluation (Signed)
Anesthesia Evaluation  Patient identified by MRN, date of birth, ID band Patient awake    Reviewed: Allergy & Precautions, H&P , Patient's Chart, lab work & pertinent test results  Airway Mallampati: II  TM Distance: >3 FB Neck ROM: full    Dental no notable dental hx.    Pulmonary neg pulmonary ROS, former smoker,    Pulmonary exam normal breath sounds clear to auscultation       Cardiovascular negative cardio ROS Normal cardiovascular exam Rhythm:regular Rate:Normal     Neuro/Psych negative neurological ROS  negative psych ROS   GI/Hepatic Neg liver ROS, GERD  ,  Endo/Other  negative endocrine ROS  Renal/GU negative Renal ROS     Musculoskeletal   Abdominal (+) + obese,   Peds  Hematology  (+) Blood dyscrasia, anemia ,   Anesthesia Other Findings   Reproductive/Obstetrics (+) Pregnancy                             Anesthesia Physical Anesthesia Plan  ASA: II  Anesthesia Plan: Epidural   Post-op Pain Management:    Induction:   Airway Management Planned:   Additional Equipment:   Intra-op Plan:   Post-operative Plan:   Informed Consent: I have reviewed the patients History and Physical, chart, labs and discussed the procedure including the risks, benefits and alternatives for the proposed anesthesia with the patient or authorized representative who has indicated his/her understanding and acceptance.     Plan Discussed with:   Anesthesia Plan Comments:         Anesthesia Quick Evaluation

## 2016-09-24 NOTE — Anesthesia Procedure Notes (Signed)
Epidural Patient location during procedure: OB Start time: 09/24/2016 9:26 PM End time: 09/24/2016 9:30 PM  Staffing Anesthesiologist: Leilani AbleHATCHETT, Sunita Demond  Preanesthetic Checklist Completed: patient identified, site marked, surgical consent, pre-op evaluation, timeout performed, IV checked, risks and benefits discussed and monitors and equipment checked  Epidural Patient position: sitting Prep: site prepped and draped and DuraPrep Patient monitoring: continuous pulse ox and blood pressure Approach: midline Location: L3-L4 Injection technique: LOR air  Needle:  Needle type: Tuohy  Needle gauge: 17 G Needle length: 9 cm and 9 Needle insertion depth: 6 cm Catheter type: closed end flexible Catheter size: 19 Gauge Catheter at skin depth: 11 cm Test dose: negative and Other  Assessment Sensory level: T9 Events: blood not aspirated, injection not painful, no injection resistance, negative IV test and no paresthesia  Additional Notes Reason for block:procedure for pain

## 2016-09-24 NOTE — Anesthesia Pain Management Evaluation Note (Signed)
  CRNA Pain Management Visit Note  Patient: Lori Lam, 21 y.o., female  "[facility]. We have an anesthesia team available at all times to provide care throughout the hospital, including epidural management and anesthesia for C-section. I don't know your plan for the delivery whether it a natural birth, water birth, IV sedation, nitrous supplementation, doula or epidural, but we want to meet your pain goals."   1.Was your pain managed to your expectations on prior hospitalizations?   Yes   2.What is your expectation for pain management during this hospitalization?     Epidural  3.How can we help you reach that goal? Epidural  Record the patient's initial score and the patient's pain goal.   Pain: 0  Pain Goal: 5 [facility] wants you to be able to say your pain was always managed very well.  Rica RecordsICKELTON,Jaquasia Doscher 09/24/2016

## 2016-09-24 NOTE — H&P (Signed)
Lori Lam is a 21 y.o. female G2P1001 at 39 weeks 4 days presenting for spontaneous rupture of membranes at 0800. She reports leaking clear fluid. Denies contractions or vaginal bleeding. Reports good fetal movement.   Clinic [facility] Prenatal Labs  Dating [place] 02-05-16 6.3 weeks Blood type: B/Positive/-- (11/08 1546)   Genetic Screen Quad: negative     Antibody:Negative (11/08 1546)  Anatomic [place] Normal Rubella: 5.80 (11/08 1546)  GTT  Third trimester: WNL RPR: Non Reactive (03/07 1048)   Flu vaccine Declined HBsAg: Negative (11/08 1546)   TDaP vaccine Undecided- info given                                Rhogam: n/a HIV: Non Reactive (03/07 1048)   Baby Food Breast/Bottle                                         [id number] (05/01 1645)  Contraception nuva ring Pap: normal 03-09-16  Circumcision yes   Pediatrician  [facility]   Support Person  pt's father    OB History    Gravida Para Term Preterm AB Living   2 1 1  0 0 1   SAB TAB Ectopic Multiple Live Births   0 0 0 0 1     Past Medical History:  Diagnosis Date  . Anemia   . Anxiety   . Asthma   . Chlamydia contact, treated   . GERD (gastroesophageal reflux disease)    Past Surgical History:  Procedure Laterality Date  . HERNIA REPAIR     Family History: family history includes Diabetes in her other; Hypertension in her maternal grandfather, maternal grandmother, other, and paternal grandmother. Social History:  reports that she quit smoking about 10 months ago. Her smoking use included Cigarettes. She smoked 0.50 packs per day. She has never used smokeless tobacco. She reports that she does not drink alcohol or use drugs.     Maternal Diabetes: No Genetic Screening: Normal Maternal Ultrasounds/Referrals: Normal Fetal Ultrasounds or other Referrals:  None Maternal Substance Abuse:  No Significant Maternal Medications:  None Significant Maternal Lab Results:  None Other Comments:  None  Review of  Systems  Constitutional: Negative.   Respiratory: Negative.  Negative for shortness of breath.   Cardiovascular: Negative.  Negative for chest pain.  Gastrointestinal: Negative for abdominal pain.  Genitourinary: Negative.   Neurological: Negative.    Maternal Medical History:  Reason for admission: Rupture of membranes.   Contractions: Frequency: rare.   Duration is approximately 80 seconds.   Perceived severity is mild.    Fetal activity: Perceived fetal activity is normal.    Prenatal complications: no prenatal complications Prenatal Complications - Diabetes: none.      Blood pressure 114/71, pulse 82, temperature 98.6 F (37 C), temperature source Oral, resp. rate 16, height 5\' 4"  (1.626 m), weight 211 lb (95.7 kg), last menstrual period 12/22/2015, SpO2 100 %. Maternal Exam:  Uterine Assessment: Contraction strength is mild.  Contraction duration is 80 seconds. Contraction frequency is rare.   Abdomen: Patient reports no abdominal tenderness. Fetal presentation: vertex  Introitus: Normal vulva. Normal vagina.  Ferning test: positive.  Nitrazine test: not done. Amniotic fluid character: clear.  Pelvis: adequate for delivery.   Cervix: Cervix evaluated by digital exam.     Fetal  Exam Fetal Monitor Review: Mode: ultrasound.   Baseline rate: 130.  Variability: moderate (6-25 bpm).   Pattern: accelerations present and no decelerations.    Fetal State Assessment: Category I - tracings are normal.     Physical Exam  Nursing note and vitals reviewed. Constitutional: She is oriented to person, place, and time. She appears well-developed and well-nourished.  HENT:  Head: Normocephalic and atraumatic.  Eyes: Conjunctivae are normal. No scleral icterus.  Respiratory: Effort normal. No respiratory distress.  GI: Soft. She exhibits no distension.  Neurological: She is alert and oriented to person, place, and time.  Skin: Skin is warm and dry.  Psychiatric: She has a  normal mood and affect. Her behavior is normal. Judgment and thought content normal.    Prenatal labs: ABO, Rh: --/--/B POS (05/26 0858) Antibody: NEG (05/26 0858) Rubella: 5.80 (11/08 1546) RPR: Non Reactive (03/07 1048)  HBsAg: Negative (11/08 1546)  HIV: Non Reactive (03/07 1048)  GBS: Positive (05/01 1645)   Assessment/Plan: IUP at term. Spontaneous rupture of membranes. GBS pos.  Admit to birthing suites Manage expectantly, plan to check cervix around 1pm to determine IOL plan Pt plans epidural for pain management.  PCN for GBS prophylaxis.  Counseled patient on birth control options for use during breastfeeding.    Cleone SlimCaroline Neill SNM 09/24/2016, 10:54 AM   I spoke with and examined patient and agree with resident/PA/SNM's note and plan of care.  6730w4d by LMP c/w 6wk u/s, PROM @ 0730, clear fluid. +FM. Not feeling any uc's, random on efm. FHR Cat 1. Pregnancy uncomplicated.  GBS+, tx w/ PCN per protocol Expectant management for now, plan SVE ~1300 to determine if induction/augmentation needed Vtx confirmed by informal transabdominal u/s Plans to breastfeed Outpatient circumcision Undecided about contraception, discussed options Cheral MarkerKimberly R. Gevon Markus, CNM, [facility]NP-BC 09/24/2016 12:11 PM

## 2016-09-24 NOTE — Progress Notes (Signed)
Lori Lam is a 21 y.o. G2P1001 at 2333w4d admitted for rupture of membranes  Subjective: Patient comfortable, not feeling contractions. Still leaking clear fluid  Objective: BP 114/71 (BP Location: Right Arm)   Pulse 82   Temp 98.6 F (37 C) (Oral)   Resp 16   Ht 5\' 4"  (1.626 m)   Wt 211 lb (95.7 kg)   LMP 12/22/2015 (Exact Date)   SpO2 100%   BMI 36.22 kg/m  No intake/output data recorded.  FHT:  FHR: 140 bpm, variability: moderate,  accelerations:  Present,  decelerations:  Absent UC:   irregular, every 6-8 minutes  SVE: 1cm/50%/-3 station, vertex  Exam by: Cleone Slimaroline Hau Sanor SNM  Labs: Lab Results  Component Value Date   WBC 11.2 (H) 09/24/2016   HGB 11.5 (L) 09/24/2016   HCT 34.5 (L) 09/24/2016   MCV 89.8 09/24/2016   PLT 207 09/24/2016    Assessment / Plan: IUP at term. SROM. GBS pos  Discussed with patient methods of augmentation including foley bulb and cytotec. Pt declines foley bulb and agreeable to cytotec.  Plan: cytotec for cervical ripening. Plan to recheck in with next dose and attempt foley bulb.   Cleone SlimCaroline Jaimie Pippins SNM 09/24/2016, 1:25 PM

## 2016-09-25 ENCOUNTER — Encounter (HOSPITAL_COMMUNITY): Payer: Self-pay

## 2016-09-25 DIAGNOSIS — Z3A39 39 weeks gestation of pregnancy: Secondary | ICD-10-CM

## 2016-09-25 DIAGNOSIS — O99824 Streptococcus B carrier state complicating childbirth: Secondary | ICD-10-CM

## 2016-09-25 LAB — CBC
HEMATOCRIT: 35.2 % — AB (ref 36.0–46.0)
HEMOGLOBIN: 12 g/dL (ref 12.0–15.0)
MCH: 30.3 pg (ref 26.0–34.0)
MCHC: 34.1 g/dL (ref 30.0–36.0)
MCV: 88.9 fL (ref 78.0–100.0)
Platelets: 204 10*3/uL (ref 150–400)
RBC: 3.96 MIL/uL (ref 3.87–5.11)
RDW: 14.1 % (ref 11.5–15.5)
WBC: 14.8 10*3/uL — AB (ref 4.0–10.5)

## 2016-09-25 LAB — SYPHILIS: RPR W/REFLEX TO RPR TITER AND TREPONEMAL ANTIBODIES, TRADITIONAL SCREENING AND DIAGNOSIS ALGORITHM: RPR Ser Ql: NONREACTIVE

## 2016-09-25 MED ORDER — SIMETHICONE 80 MG PO CHEW: 80.0000 mg | CHEWABLE_TABLET | ORAL | Status: DC | PRN

## 2016-09-25 MED ORDER — DIPHENHYDRAMINE HCL 25 MG PO CAPS: 25.0000 mg | ORAL_CAPSULE | Freq: Four times a day (QID) | ORAL | Status: DC | PRN

## 2016-09-25 MED ORDER — ONDANSETRON HCL 4 MG PO TABS: 4.0000 mg | ORAL_TABLET | ORAL | Status: DC | PRN

## 2016-09-25 MED ORDER — WITCH HAZEL-GLYCERIN EX PADS
1.0000 "application " | MEDICATED_PAD | CUTANEOUS | Status: DC | PRN
  Administered 2016-09-25: 1 via TOPICAL

## 2016-09-25 MED ORDER — SENNOSIDES-DOCUSATE SODIUM 8.6-50 MG PO TABS
2.0000 | ORAL_TABLET | ORAL | Status: DC
  Administered 2016-09-25 – 2016-09-26 (×2): 2 via ORAL
  Filled 2016-09-25 (×2): qty 2

## 2016-09-25 MED ORDER — PRENATAL MULTIVITAMIN CH
1.0000 | ORAL_TABLET | Freq: Every day | ORAL | Status: DC
  Administered 2016-09-25 – 2016-09-26 (×2): 1 via ORAL
  Filled 2016-09-25 (×2): qty 1

## 2016-09-25 MED ORDER — DIBUCAINE 1 % RE OINT: 1.0000 "application " | TOPICAL_OINTMENT | RECTAL | Status: DC | PRN

## 2016-09-25 MED ORDER — ZOLPIDEM TARTRATE 5 MG PO TABS: 5.0000 mg | ORAL_TABLET | Freq: Every evening | ORAL | Status: DC | PRN

## 2016-09-25 MED ORDER — ACETAMINOPHEN 325 MG PO TABS
650.0000 mg | ORAL_TABLET | ORAL | Status: DC | PRN
  Administered 2016-09-25 – 2016-09-27 (×4): 650 mg via ORAL
  Filled 2016-09-25 (×6): qty 2

## 2016-09-25 MED ORDER — COCONUT OIL OIL: 1.0000 "application " | TOPICAL_OIL | Status: DC | PRN

## 2016-09-25 MED ORDER — BENZOCAINE-MENTHOL 20-0.5 % EX AERO
1.0000 "application " | INHALATION_SPRAY | CUTANEOUS | Status: DC | PRN
  Administered 2016-09-25: 1 via TOPICAL
  Filled 2016-09-25: qty 56

## 2016-09-25 MED ORDER — ONDANSETRON HCL 4 MG/2ML IJ SOLN: 4.0000 mg | INTRAMUSCULAR | Status: DC | PRN

## 2016-09-25 MED ORDER — IBUPROFEN 600 MG PO TABS
600.0000 mg | ORAL_TABLET | Freq: Four times a day (QID) | ORAL | Status: DC
  Administered 2016-09-25 – 2016-09-27 (×7): 600 mg via ORAL
  Filled 2016-09-25 (×9): qty 1

## 2016-09-25 MED ORDER — TETANUS-DIPHTH-ACELL PERTUSSIS 5-2.5-18.5 LF-MCG/0.5 IM SUSP
0.5000 mL | Freq: Once | INTRAMUSCULAR | Status: AC
  Administered 2016-09-26: 0.5 mL via INTRAMUSCULAR
  Filled 2016-09-25: qty 0.5

## 2016-09-25 NOTE — Progress Notes (Signed)
CSW received referral for marijuana use.  Upon chart review, CSW notes that marijuana use is documented in 2014 and MOB had a negative UDS on her initial prenatal visit.  Therefore, CSW is screening out referral for marijuana use as it does not appear current.  CSW notes hx of Anx/Dep noted in MOB's chart, but this is also documented in 2014 with no current concerns noted.  Please call if current concerns arise or by MOB's request. 

## 2016-09-25 NOTE — Lactation Note (Signed)
This note was copied from a baby'Lam chart. Lactation Consultation Note  Patient Name: Lori Lam XLKGM'WToday'Lam Date: 09/25/2016 Reason for consult: Initial assessment   Initial consult with mom of 16 hour old infant. Infant with 4 BF for 20-30 minutes, 1 bottle formula of 3 cc, 1 void and 2 stools since birth. LATCH scores 8. Mom reports she BF her 89 yo for 3 weeks and stopped due to Mastitis.   Mom reports infant is BF well. Mom reports she has some difficulty getting infant to feed on the left breast and she feels akward using that side. Enc mom to try different positions to find the one that works the best, mom reports she football hold works best. Mom reports she is planning to give formula at night. Reviewed colostrum, milk coming to volume, supply and demand, NB nutritional needs, NB feeding behaviors, engorgement prevention, and feeding infant STS 8-12 x in 24 hours at first feeding cues.   Mom reports she has a manual pump and tried to pump the left breast, she did not get any colostrum, mom report she knows how to hand expressed, reviewed hand expression usually yields more milk at this stage. Mom is thinking of pumping left breast if not able to get infant to latch on that side.   Infant currently asleep. Offered for mom to call out to desk for feeding assistance if needed. BF Resources Handout and [facility] broch[facility] given, mom informed of IP/OP Services, BF Support Groups and LC phone #. Enc mom to call out for feeding assistance as needed.    Maternal Data Formula Feeding for Exclusion: Yes Has patient been taught Hand Expression?: Yes Does the patient have breastfeeding experience prior to this delivery?: Yes  Feeding    LATCH Score/Interventions                      Lactation Tools Discussed/Used WIC Program: Yes   Consult Status Consult Status: Follow-up Date: 09/26/16 Follow-up type: In-patient    Lori Lam Lori Lam 09/25/2016, 5:07 PM

## 2016-09-25 NOTE — Anesthesia Postprocedure Evaluation (Signed)
Anesthesia Post Note  Patient: Verna CzechMaryann A Schuman  Procedure(s) Performed: * No procedures listed *  Patient location during evaluation: Mother Baby Anesthesia Type: Epidural Level of consciousness: awake and alert and oriented Pain management: satisfactory to patient Vital Signs Assessment: post-procedure vital signs reviewed and stable Respiratory status: spontaneous breathing and nonlabored ventilation Cardiovascular status: stable Postop Assessment: no headache, no backache, no signs of nausea or vomiting, adequate PO intake and patient able to bend at knees (patient up walking) Anesthetic complications: no        Last Vitals:  Vitals:   09/25/16 0345 09/25/16 0753  BP: 127/68 110/62  Pulse: 79 75  Resp: 18 18  Temp: 36.7 C 37.1 C    Last Pain:  Vitals:   09/25/16 0758  TempSrc:   PainSc: 0-No pain   Pain Goal: Patients Stated Pain Goal: 3 (09/25/16 0548)               Madison HickmanGREGORY,Blaine Hari

## 2016-09-26 MED ORDER — IBUPROFEN 600 MG PO TABS: 600.0000 mg | ORAL_TABLET | Freq: Four times a day (QID) | ORAL | 0 refills | Status: DC

## 2016-09-26 MED ORDER — OXYCODONE-ACETAMINOPHEN 5-325 MG PO TABS
1.0000 | ORAL_TABLET | ORAL | Status: DC | PRN
  Administered 2016-09-26 – 2016-09-27 (×3): 1 via ORAL
  Filled 2016-09-26 (×3): qty 1

## 2016-09-26 NOTE — Progress Notes (Signed)
Post discharge chart review completed.  

## 2016-09-26 NOTE — Discharge Summary (Signed)
OB Discharge Summary  Patient Name: Lori Lam DOB: 05/09/1995 MRN: [id number]  Date of admission: 09/24/2016 Delivering MD: Raelyn MoraAWSON, ROLITTA   Date of discharge: 09/26/2016  Admitting diagnosis: 29WKS WATER BROKE  Intrauterine pregnancy: 5760w5d     Secondary diagnosis:Active Problems:   Normal labor  Additional problems:none     Discharge diagnosis: Term Pregnancy Delivered                                                                     Post partum procedures:none  Augmentation: n/a  Complications: None  Hospital course:  Onset of Labor With Vaginal Delivery     21 y.o. yo [id number] at 7160w5d was admitted in Active Labor on 09/24/2016. Patient had an uncomplicated labor course as follows:  Membrane Rupture Time/Date: 7:30 AM ,09/24/2016   Intrapartum Procedures: Episiotomy: None [1]                                         Lacerations:  None [1]  Patient had a delivery of a Viable infant. 09/25/2016  Information for the patient's newborn:  Gaetano NetCrawford, Boy Hareem [[id number]][[id number]]  Delivery Method: Vag-Spont    Pateint had an uncomplicated postpartum course.  She is ambulating, tolerating a regular diet, passing flatus, and urinating well. Patient is discharged home in stable condition on 09/26/16.   Physical exam  Vitals:   09/25/16 0245 09/25/16 0345 09/25/16 0753 09/25/16 1619  BP: 128/65 127/68 110/62   Pulse: 69 79 75 80  Resp: 18 18 18 18   Temp: 97.7 F (36.5 C) 98 F (36.7 C) 98.8 F (37.1 C) 97.5 F (36.4 C)  TempSrc: Oral Oral Oral Oral  SpO2: 99%   100%  Weight:      Height:       General: alert, cooperative and no distress Lochia: appropriate Uterine Fundus: firm Incision: N/A DVT Evaluation: No evidence of DVT seen on physical exam. Labs: Lab Results  Component Value Date   WBC 14.8 (H) 09/25/2016   HGB 12.0 09/25/2016   HCT 35.2 (L) 09/25/2016   MCV 88.9 09/25/2016   PLT 204 09/25/2016   CMP Latest Ref Rng & Units 05/09/2009  Glucose 70  - 99 mg/dL 78  BUN 6 - 23 mg/dL 8  Creatinine 0.4 - 1.2 mg/dL 1.300.72  Sodium 865135 - 784145 mEq/L 138  Potassium 3.5 - 5.1 mEq/L 4.6 SPECIMEN HEMOLYZED. HEMOLYSIS MAY AFFECT INTEGRITY OF RESULTS.  Chloride 96 - 112 mEq/L 106  CO2 19 - 32 mEq/L 26  Calcium 8.4 - 10.5 mg/dL 9.0  Total Protein 6.0 - 8.3 g/dL 7.5  Total Bilirubin 0.3 - 1.2 mg/dL 1.1  Alkaline Phos 50 - 162 U/L 53  AST 0 - 37 U/L 35  ALT 0 - 35 U/L 15    Discharge instruction: per After Visit Summary and "Baby and Me Booklet".  After Visit Meds:    Diet: routine diet  Activity: Advance as tolerated. Pelvic rest for 6 weeks.   Outpatient follow up:6 weeks Follow up Appt:Future Appointments Date Time Provider Department Center  09/27/2016 2:45 PM Orvilla CornwallDenney, Rachelle A, CNM [facility] None  10/04/2016  7:00 AM [facility] RO[facility] None   Follow up visit: No Follow-up on file.  Postpartum contraception: Undecided  Newborn Data: Live born female  Birth Weight: 7 lb 11.3 oz (3495 g) APGAR: 9, 9  Baby Feeding: Bottle Disposition:home with mother   09/26/2016 Wyvonnia Dusky, CNM

## 2016-09-27 ENCOUNTER — Encounter: Payer: Medicaid Other | Admitting: Certified Nurse Midwife

## 2016-09-27 MED ORDER — IBUPROFEN 600 MG PO TABS: 600.0000 mg | ORAL_TABLET | Freq: Four times a day (QID) | ORAL | 2 refills | Status: DC

## 2016-09-27 MED ORDER — SENNOSIDES-DOCUSATE SODIUM 8.6-50 MG PO TABS: 2.0000 | ORAL_TABLET | Freq: Every day | ORAL | 2 refills | Status: DC

## 2016-09-27 MED ORDER — OXYCODONE-ACETAMINOPHEN 5-325 MG PO TABS: 1.0000 | ORAL_TABLET | ORAL | 0 refills | Status: DC | PRN

## 2016-09-27 MED ORDER — IBUPROFEN 600 MG PO TABS: 600.0000 mg | ORAL_TABLET | Freq: Four times a day (QID) | ORAL | Status: DC | PRN

## 2016-09-27 NOTE — Discharge Summary (Signed)
OB Discharge Summary     Patient Name: Lori Lam DOB: [birth year] MRN: [id number]  Date of admission: 09/24/2016 Delivering MD: Raelyn Mora   Date of discharge: 09/27/2016  Admitting diagnosis: 29WKS WATER BROKE  Intrauterine pregnancy: [redacted]w[redacted]d     Secondary diagnosis:  Active Problems:   Normal labor  Additional problems: none     Discharge diagnosis: Term Pregnancy Delivered                                                                                                Post partum procedures:none  Augmentation: none  Complications: None  Hospital course:  Onset of Labor With Vaginal Delivery     21 y.o. yo [id number] at [redacted]w[redacted]d was admitted in Active Labor on 09/24/2016. Patient had an uncomplicated labor course as follows:  Membrane Rupture Time/Date: 7:30 AM ,09/24/2016   Intrapartum Procedures: Episiotomy: None [1]                                         Lacerations:  None [1]  Patient had a delivery of a Viable infant. 09/25/2016  Information for the patient's newborn:  Ayona, Yniguez [[id number]]  Delivery Method: Vag-Spont    Pateint had an uncomplicated postpartum course.  She is ambulating, tolerating a regular diet, passing flatus, and urinating well. Patient is discharged home in stable condition on 09/27/16.   Physical exam  Vitals:   09/25/16 1619 09/26/16 0630 09/26/16 1825 09/27/16 0530  BP:  105/71 120/71 127/72  Pulse: 80 68 63 79  Resp: 18 18 18 18   Temp: 97.5 F (36.4 C) 98 F (36.7 C) 97.8 F (36.6 C) 98.3 F (36.8 C)  TempSrc: Oral Axillary Oral Oral  SpO2: 100%     Weight:      Height:       General: alert, cooperative and no distress Lochia: appropriate Uterine Fundus: firm Incision: N/A DVT Evaluation: No evidence of DVT seen on physical exam. Negative Homan's sign. No cords or calf tenderness. Labs: Lab Results  Component Value Date   WBC 14.8 (H) 09/25/2016   HGB 12.0 09/25/2016   HCT 35.2 (L) 09/25/2016   MCV 88.9  09/25/2016   PLT 204 09/25/2016   CMP Latest Ref Rng & Units 05/09/2009  Glucose 70 - 99 mg/dL 78  BUN 6 - 23 mg/dL 8  Creatinine 0.4 - 1.2 mg/dL 1.30  Sodium 865 - 784 mEq/L 138  Potassium 3.5 - 5.1 mEq/L 4.6 SPECIMEN HEMOLYZED. HEMOLYSIS MAY AFFECT INTEGRITY OF RESULTS.  Chloride 96 - 112 mEq/L 106  CO2 19 - 32 mEq/L 26  Calcium 8.4 - 10.5 mg/dL 9.0  Total Protein 6.0 - 8.3 g/dL 7.5  Total Bilirubin 0.3 - 1.2 mg/dL 1.1  Alkaline Phos 50 - 162 U/L 53  AST 0 - 37 U/L 35  ALT 0 - 35 U/L 15    Discharge instruction: per After Visit Summary and "Baby and Me Booklet".  After visit meds:  Allergies as of 09/27/2016  No Known Allergies     Medication List    TAKE these medications   ibuprofen 600 MG tablet Commonly known as:  ADVIL,MOTRIN Take 1 tablet (600 mg total) by mouth every 6 (six) hours.   omeprazole 20 MG capsule Commonly known as:  PRILOSEC Take 1 capsule (20 mg total) by mouth 2 (two) times daily before a meal.   Prenatal Vitamins 0.8 MG tablet Take 1 tablet by mouth daily.       Diet: routine diet  Activity: Advance as tolerated. Pelvic rest for 6 weeks.   Outpatient follow up:6 weeks Follow up Appt:Future Appointments Date Time Provider Department Center  09/27/2016 2:45 PM Orvilla Cornwallenney, Rachelle A, CNM [facility] None   Follow up Visit:No Follow-up on file.  Postpartum contraception: Undecided  Newborn Data: Live born female  Birth Weight: 7 lb 11.3 oz (3495 g) APGAR: 9, 9  Baby Feeding: Bottle Disposition:home with mother   09/27/2016 Elsie LincolnKelly Leggett, MD

## 2016-09-27 NOTE — Plan of Care (Signed)
Problem: Education: Goal: Knowledge of condition will improve Outcome: Progressing Cabbage leaves provided for engorgement. Explained the difference between using cabbage leaves to dry up milk and pumping for comfort

## 2016-09-27 NOTE — Discharge Instructions (Signed)

## 2016-09-27 NOTE — Progress Notes (Signed)
Post Partum Day #1 Subjective: no complaints, up ad lib, voiding and tolerating PO  Objective: Blood pressure 127/72, pulse 79, temperature 98.3 F (36.8 C), temperature source Oral, resp. rate 18, height 5\' 4"  (1.626 m), weight 211 lb (95.7 kg), last menstrual period 12/22/2015, SpO2 100 %, unknown if currently breastfeeding.  Physical Exam:  General: alert, cooperative and no distress Lochia: appropriate Uterine Fundus: firm Incision: none DVT Evaluation: No evidence of DVT seen on physical exam. No cords or calf tenderness. No significant calf/ankle edema.   Recent Labs  09/24/16 0858 09/25/16 0543  HGB 11.5* 12.0  HCT 34.5* 35.2*    Assessment/Plan: Discharge home and Contraception Nuva Ring vs Patch   LOS: 3 days   Roe CoombsRachelle A Jeremias Broyhill, CNM 09/27/2016, 7:39 AM

## 2016-09-27 NOTE — Discharge Summary (Signed)
OB Discharge Summary     Patient Name: Lori Lam DOB: 12/07/1995 MRN: [id number]  Date of admission: 09/24/2016 Delivering MD: Raelyn MoraAWSON, ROLITTA   Date of discharge: 09/27/2016  Admitting diagnosis: 29WKS WATER BROKE  Intrauterine pregnancy: 3331w5d     Secondary diagnosis:  Active Problems:   Normal labor  Additional problems: Hx of depression     Discharge diagnosis: Term Pregnancy Delivered                                                                                                Post partum procedures:none  Augmentation: Pitocin, Cytotec and Foley Balloon  Complications: None  Hospital course:  Onset of Labor With Vaginal Delivery     21 y.o. yo [id number] at 6731w5d was admitted in Latent Labor on 09/24/2016. Patient had an uncomplicated labor course as follows:  Membrane Rupture Time/Date: 7:30 AM ,09/24/2016   Intrapartum Procedures: Episiotomy: None [1]                                         Lacerations:  None [1]  Patient had a delivery of a Viable infant. 09/25/2016  Information for the patient's newborn:  Gaetano NetCrawford, Boy Khila [[id number]][[id number]]  Delivery Method: Vag-Spont    Pateint had an uncomplicated postpartum course.  She is ambulating, tolerating a regular diet, passing flatus, and urinating well. Patient is discharged home in stable condition on 09/27/16.   Physical exam  Vitals:   09/25/16 1619 09/26/16 0630 09/26/16 1825 09/27/16 0530  BP:  105/71 120/71 127/72  Pulse: 80 68 63 79  Resp: 18 18 18 18   Temp: 97.5 F (36.4 C) 98 F (36.7 C) 97.8 F (36.6 C) 98.3 F (36.8 C)  TempSrc: Oral Axillary Oral Oral  SpO2: 100%     Weight:      Height:       General: alert, cooperative and no distress Lochia: appropriate Uterine Fundus: firm Incision: N/A DVT Evaluation: No evidence of DVT seen on physical exam. No cords or calf tenderness. No significant calf/ankle edema. Labs: Lab Results  Component Value Date   WBC 14.8 (H) 09/25/2016   HGB  12.0 09/25/2016   HCT 35.2 (L) 09/25/2016   MCV 88.9 09/25/2016   PLT 204 09/25/2016   CMP Latest Ref Rng & Units 05/09/2009  Glucose 70 - 99 mg/dL 78  BUN 6 - 23 mg/dL 8  Creatinine 0.4 - 1.2 mg/dL 1.300.72  Sodium 865135 - 784145 mEq/L 138  Potassium 3.5 - 5.1 mEq/L 4.6 SPECIMEN HEMOLYZED. HEMOLYSIS MAY AFFECT INTEGRITY OF RESULTS.  Chloride 96 - 112 mEq/L 106  CO2 19 - 32 mEq/L 26  Calcium 8.4 - 10.5 mg/dL 9.0  Total Protein 6.0 - 8.3 g/dL 7.5  Total Bilirubin 0.3 - 1.2 mg/dL 1.1  Alkaline Phos 50 - 162 U/L 53  AST 0 - 37 U/L 35  ALT 0 - 35 U/L 15    Discharge instruction: per After Visit Summary and "Baby and Me Booklet".  After  visit meds:  Allergies as of 09/27/2016   No Known Allergies     Medication List    TAKE these medications   ibuprofen 600 MG tablet Commonly known as:  ADVIL,MOTRIN Take 1 tablet (600 mg total) by mouth every 6 (six) hours.   omeprazole 20 MG capsule Commonly known as:  PRILOSEC Take 1 capsule (20 mg total) by mouth 2 (two) times daily before a meal.   oxyCODONE-acetaminophen 5-325 MG tablet Commonly known as:  PERCOCET/ROXICET Take 1-2 tablets by mouth every 4 (four) hours as needed for severe pain.   Prenatal Vitamins 0.8 MG tablet Take 1 tablet by mouth daily.   senna-docusate 8.6-50 MG tablet Commonly known as:  Senokot-S Take 2 tablets by mouth at bedtime.       Diet: routine diet  Activity: Advance as tolerated. Pelvic rest for 6 weeks.   Outpatient follow up:5 weeks Follow up Appt:Future Appointments Date Time Provider Department Center  09/27/2016 2:45 PM Orvilla Cornwall A, CNM [facility] None   Follow up Visit:No Follow-up on file.  Postpartum contraception: Patch vs Nuva Ring  Newborn Data: Live born female  Birth Weight: 7 lb 11.3 oz (3495 g) APGAR: 9, 9  Baby Feeding: Bottle Disposition:home with mother   09/27/2016 Roe Coombs, CNM

## 2016-09-27 NOTE — Progress Notes (Signed)
Contacted Dr Cyril LoosenSchnek to confirm it was all right to give pt one percocet after motrin and tylenol did not effectively treat pain. He confirmed it was all right.

## 2016-10-04 ENCOUNTER — Inpatient Hospital Stay (HOSPITAL_COMMUNITY): Payer: Medicaid Other

## 2016-10-31 ENCOUNTER — Ambulatory Visit (INDEPENDENT_AMBULATORY_CARE_PROVIDER_SITE_OTHER): Payer: Medicaid Other | Admitting: Certified Nurse Midwife

## 2016-10-31 ENCOUNTER — Encounter: Payer: Self-pay | Admitting: Certified Nurse Midwife

## 2016-10-31 DIAGNOSIS — Z3042 Encounter for surveillance of injectable contraceptive: Secondary | ICD-10-CM | POA: Diagnosis not present

## 2016-10-31 MED ORDER — MEDROXYPROGESTERONE ACETATE 150 MG/ML IM SUSP
150.0000 mg | Freq: Once | INTRAMUSCULAR | Status: AC
  Administered 2016-10-31: 150 mg via INTRAMUSCULAR

## 2016-10-31 MED ORDER — MEDROXYPROGESTERONE ACETATE 150 MG/ML IM SUSP: 150.0000 mg | INTRAMUSCULAR | 0 refills | Status: DC

## 2016-10-31 NOTE — Progress Notes (Signed)
Subjective:     Lori Lam is a 21 y.o. female who presents for a postpartum visit. She is 4 weeks postpartum following a spontaneous vaginal delivery. I have fully reviewed the prenatal and intrapartum course. The delivery was at 39 gestational weeks. Outcome: spontaneous vaginal delivery. Anesthesia: epidural. Postpartum course has been UNREMARKABLE. Baby's course has been UNREMARKABLE. Baby is feeding by bottle - Enfamil with Iron. Bleeding no bleeding. Bowel function is normal. Bladder function is normal. Patient is sexually active. Contraception method is none. Postpartum depression screening: negative.  Patient has been sexually active one time with condom use.  Stopped bleeding 2 weeks ago.  Contraception choices discussed.  Previus Nexplanon use with break through bleeding in year 2 for over a month.    The following portions of the patient's history were reviewed and updated as appropriate: allergies, current medications, past family history, past medical history, past social history, past surgical history and problem list.  Review of Systems Pertinent items noted in HPI and remainder of comprehensive ROS otherwise negative.   Objective:    BP 132/74   Pulse 63   Ht 5\' 4"  (1.626 m)   Wt 209 lb (94.8 kg)   LMP 10/19/2016 (Approximate)   Breastfeeding? No   BMI 35.87 kg/m   General:  alert, cooperative and no distress   Breasts:  inspection negative, no nipple discharge or bleeding, no masses or nodularity palpable  Lungs: clear to auscultation bilaterally  Heart:  regular rate and rhythm, S1, S2 normal, no murmur, click, rub or gallop  Abdomen: soft, non-tender; bowel sounds normal; no masses,  no organomegaly  Pelvic Exam: Not performed.        Assessment:     Normal 4 week postpartum exam. Pap smear not done at today's visit.   Contraception management: starting Depo injections no contraindications   Up to date on Pap smear  Plan:    1. Contraception: Depo-Provera  injections 2.  F/U every 3 months for depo injections.  Pap 03/09/16:normal 3. Follow up in: 3 months or as needed. Annual exam 03/10/17.

## 2017-01-13 ENCOUNTER — Ambulatory Visit: Payer: Self-pay | Admitting: Certified Nurse Midwife

## 2017-01-20 ENCOUNTER — Ambulatory Visit: Payer: Medicaid Other

## 2017-01-27 ENCOUNTER — Ambulatory Visit (INDEPENDENT_AMBULATORY_CARE_PROVIDER_SITE_OTHER): Payer: Medicaid Other | Admitting: Certified Nurse Midwife

## 2017-01-27 ENCOUNTER — Encounter: Payer: Self-pay | Admitting: Certified Nurse Midwife

## 2017-01-27 VITALS — BP 135/95 | HR 72 | Ht 66.0 in | Wt 223.6 lb

## 2017-01-27 DIAGNOSIS — Z3202 Encounter for pregnancy test, result negative: Secondary | ICD-10-CM

## 2017-01-27 DIAGNOSIS — Z3049 Encounter for surveillance of other contraceptives: Secondary | ICD-10-CM | POA: Diagnosis not present

## 2017-01-27 DIAGNOSIS — Z30017 Encounter for initial prescription of implantable subdermal contraceptive: Secondary | ICD-10-CM | POA: Insufficient documentation

## 2017-01-27 LAB — POCT URINE PREGNANCY: Preg Test, Ur: NEGATIVE

## 2017-01-27 MED ORDER — ETONOGESTREL 68 MG ~~LOC~~ IMPL
68.0000 mg | DRUG_IMPLANT | Freq: Once | SUBCUTANEOUS | Status: AC
  Administered 2017-01-27: 68 mg via SUBCUTANEOUS

## 2017-01-27 NOTE — Progress Notes (Signed)
Patient is in the office for nexplanon insertion. Pt is unsure of LMP because cycles are irregular. Pt had elevated BP today in office and pt stated that she has previously had some headaches. Advised pt to follow up with her primary care as soon as possible, pt agreed.

## 2017-01-27 NOTE — Progress Notes (Signed)
Nexplanon Procedure Note   PRE-OP DIAGNOSIS: desired long-term, reversible contraception  POST-OP DIAGNOSIS: Same  PROCEDURE: Nexplanon  placement Performing Provider: Orvilla Cornwall CNM   Patient education prior to procedure, explained risk, benefits of Nexplanon, reviewed alternative options. Patient reported understanding. Gave consent to continue with procedure.   PROCEDURE:  Pregnancy Text :  Negative Site (check):      left arm         Sterile Preparation:   Betadinex3 Lot # [id number]  [id number] Expiration Date 06/2019  Insertion site was selected 8 - 10 cm from medial epicondyle and marked along with guiding site using sterile marker. Procedure area was prepped and draped in a sterile fashion. 1% Lidocaine 1.5 ml given prior to procedure. Nexplanon  was inserted subcutaneously.Needle was removed from the insertion site. Nexplanon capsule was palpated by provider and patient to assure satisfactory placement. And a bandage applied and the arm was wrapped with gauze bandage.     Followup: The patient tolerated the procedure well without complications.  Instructions:  The patient was instructed to remove the dressing in 24 hours and that some bruising is to be expected.  She was advised to use over the counter analgesics as needed for any pain at the site.  She is to keep the area dry for 24 hours and to call if her hand or arm becomes cold, numb, or blue.   Orvilla Cornwall CNM

## 2017-01-31 ENCOUNTER — Encounter: Payer: Self-pay | Admitting: *Deleted

## 2017-03-10 ENCOUNTER — Encounter: Payer: Self-pay | Admitting: Obstetrics

## 2017-03-10 ENCOUNTER — Other Ambulatory Visit (HOSPITAL_COMMUNITY)
Admission: RE | Admit: 2017-03-10 | Discharge: 2017-03-10 | Disposition: A | Discharge status: Home / Self Care | Payer: Medicaid Other | Source: Ambulatory Visit | Attending: Certified Nurse Midwife | Admitting: Certified Nurse Midwife

## 2017-03-10 ENCOUNTER — Ambulatory Visit (INDEPENDENT_AMBULATORY_CARE_PROVIDER_SITE_OTHER): Payer: Medicaid Other | Admitting: Obstetrics

## 2017-03-10 VITALS — BP 130/83 | HR 90 | Ht 64.0 in | Wt 229.5 lb

## 2017-03-10 DIAGNOSIS — Z01419 Encounter for gynecological examination (general) (routine) without abnormal findings: Secondary | ICD-10-CM | POA: Insufficient documentation

## 2017-03-10 DIAGNOSIS — N898 Other specified noninflammatory disorders of vagina: Secondary | ICD-10-CM

## 2017-03-10 DIAGNOSIS — Z0000 Encounter for general adult medical examination without abnormal findings: Secondary | ICD-10-CM

## 2017-03-10 DIAGNOSIS — A64 Unspecified sexually transmitted disease: Secondary | ICD-10-CM

## 2017-03-10 DIAGNOSIS — Z3046 Encounter for surveillance of implantable subdermal contraceptive: Secondary | ICD-10-CM

## 2017-03-10 NOTE — Progress Notes (Signed)
Pt presents for annual and all std testing today.
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
## 2017-03-11 LAB — HEPATITIS C ANTIBODY: Hep C Virus Ab: <0.1 s/co ratio (ref 0.0–0.9)

## 2017-03-11 LAB — HEPATITIS B SURFACE ANTIGEN: HEP B S AG: NEGATIVE

## 2017-03-11 LAB — RPR: RPR: NONREACTIVE

## 2017-03-11 LAB — HIV ANTIBODY (ROUTINE TESTING W REFLEX): HIV Screen 4th Generation wRfx: NONREACTIVE

## 2017-03-13 ENCOUNTER — Other Ambulatory Visit: Payer: Self-pay | Admitting: Obstetrics

## 2017-03-13 DIAGNOSIS — B9689 Other specified bacterial agents as the cause of diseases classified elsewhere: Secondary | ICD-10-CM

## 2017-03-13 DIAGNOSIS — N760 Acute vaginitis: Principal | ICD-10-CM

## 2017-03-13 LAB — CERVICOVAGINAL ANCILLARY ONLY
Bacterial vaginitis: POSITIVE — AB
CHLAMYDIA, DNA PROBE: NEGATIVE
Candida vaginitis: NEGATIVE
Neisseria Gonorrhea: NEGATIVE
TRICH (WINDOWPATH): NEGATIVE

## 2017-03-13 LAB — CYTOLOGY - PAP: DIAGNOSIS: NEGATIVE

## 2017-03-13 MED ORDER — SECNIDAZOLE 2 G PO PACK: 1.0000 | PACK | Freq: Once | ORAL | 2 refills | Status: DC

## 2017-03-14 ENCOUNTER — Telehealth: Payer: Self-pay

## 2017-03-14 MED ORDER — SECNIDAZOLE 2 G PO PACK: 1.0000 | PACK | Freq: Once | ORAL | 2 refills | Status: AC

## 2017-03-14 NOTE — Telephone Encounter (Signed)
Contacted pt and advised of results and rx sent by provider. 

## 2017-11-26 ENCOUNTER — Emergency Department (HOSPITAL_COMMUNITY)
Admission: EM | Admit: 2017-11-26 | Discharge: 2017-11-27 | Disposition: A | Discharge status: Home / Self Care | Payer: Medicaid Other | Attending: Emergency Medicine | Admitting: Emergency Medicine

## 2017-11-26 DIAGNOSIS — S0990XA Unspecified injury of head, initial encounter: Secondary | ICD-10-CM | POA: Diagnosis present

## 2017-11-26 DIAGNOSIS — J45909 Unspecified asthma, uncomplicated: Secondary | ICD-10-CM | POA: Insufficient documentation

## 2017-11-26 DIAGNOSIS — S060X0A Concussion without loss of consciousness, initial encounter: Secondary | ICD-10-CM | POA: Diagnosis not present

## 2017-11-26 DIAGNOSIS — Y999 Unspecified external cause status: Secondary | ICD-10-CM | POA: Insufficient documentation

## 2017-11-26 DIAGNOSIS — Y9389 Activity, other specified: Secondary | ICD-10-CM | POA: Diagnosis not present

## 2017-11-26 DIAGNOSIS — Y929 Unspecified place or not applicable: Secondary | ICD-10-CM | POA: Diagnosis not present

## 2017-11-26 DIAGNOSIS — F17210 Nicotine dependence, cigarettes, uncomplicated: Secondary | ICD-10-CM | POA: Diagnosis not present

## 2017-11-27 ENCOUNTER — Encounter (HOSPITAL_COMMUNITY): Payer: Self-pay | Admitting: Emergency Medicine

## 2017-11-27 ENCOUNTER — Other Ambulatory Visit: Payer: Self-pay

## 2017-11-27 ENCOUNTER — Emergency Department (HOSPITAL_COMMUNITY): Payer: Medicaid Other

## 2017-11-27 LAB — POC URINE PREG, ED: Preg Test, Ur: NEGATIVE

## 2017-11-27 MED ORDER — ONDANSETRON 4 MG PO TBDP
4.0000 mg | ORAL_TABLET | Freq: Once | ORAL | Status: AC
  Administered 2017-11-27: 4 mg via ORAL
  Filled 2017-11-27: qty 1

## 2017-11-27 MED ORDER — ONDANSETRON HCL 4 MG PO TABS: 4.0000 mg | ORAL_TABLET | Freq: Four times a day (QID) | ORAL | 0 refills | Status: DC

## 2017-11-27 MED ORDER — ACETAMINOPHEN 500 MG PO TABS
1000.0000 mg | ORAL_TABLET | Freq: Once | ORAL | Status: AC
  Administered 2017-11-27: 1000 mg via ORAL
  Filled 2017-11-27: qty 2
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
## 2017-11-27 NOTE — Discharge Instructions (Signed)
Take ibuprofen and Tylenol as prescribed over-the-counter as needed for your headache. Take Zofran every 6 hours as needed for nausea or vomiting. Please follow up with Dr. Katrinka BlazingSmith at the concussion clinic for testing. Please return to the emergency department if you develop any new or worsening symptoms.
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
## 2017-12-08 ENCOUNTER — Ambulatory Visit (HOSPITAL_COMMUNITY)
Admission: EM | Admit: 2017-12-08 | Discharge: 2017-12-08 | Disposition: A | Discharge status: Home / Self Care | Payer: Medicaid Other | Attending: Family Medicine | Admitting: Family Medicine

## 2017-12-08 ENCOUNTER — Encounter (HOSPITAL_COMMUNITY): Payer: Self-pay | Admitting: Emergency Medicine

## 2017-12-08 DIAGNOSIS — H11152 Pinguecula, left eye: Secondary | ICD-10-CM

## 2017-12-08 MED ORDER — TRAMADOL HCL 50 MG PO TABS: 50.0000 mg | ORAL_TABLET | Freq: Four times a day (QID) | ORAL | 0 refills | Status: DC | PRN

## 2017-12-08 MED ORDER — KETOROLAC TROMETHAMINE 30 MG/ML IJ SOLN
30.0000 mg | Freq: Once | INTRAMUSCULAR | Status: AC
  Administered 2017-12-08: 30 mg via INTRAMUSCULAR

## 2017-12-08 MED ORDER — ARTIFICIAL TEARS OPHTHALMIC OINT: TOPICAL_OINTMENT | Freq: Three times a day (TID) | OPHTHALMIC | 0 refills | Status: DC

## 2017-12-08 MED ORDER — KETOROLAC TROMETHAMINE 30 MG/ML IJ SOLN
INTRAMUSCULAR | Status: AC
  Filled 2017-12-08: qty 1
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
## 2017-12-08 NOTE — Discharge Instructions (Signed)
It was nice meeting you!!  I believe that you have a pinguecula. Use the Lacrilube for relief.  If the condition becomes worse or change in vision please go to the ER.  Do not wear you contacts until this heals.
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
## 2017-12-18 ENCOUNTER — Other Ambulatory Visit: Payer: Self-pay

## 2017-12-18 ENCOUNTER — Encounter (HOSPITAL_COMMUNITY): Payer: Self-pay | Admitting: Emergency Medicine

## 2017-12-18 ENCOUNTER — Emergency Department (HOSPITAL_COMMUNITY)
Admission: EM | Admit: 2017-12-18 | Discharge: 2017-12-18 | Disposition: A | Discharge status: Home / Self Care | Payer: No Typology Code available for payment source | Attending: Emergency Medicine | Admitting: Emergency Medicine

## 2017-12-18 DIAGNOSIS — S0093XA Contusion of unspecified part of head, initial encounter: Secondary | ICD-10-CM | POA: Diagnosis not present

## 2017-12-18 DIAGNOSIS — F419 Anxiety disorder, unspecified: Secondary | ICD-10-CM | POA: Insufficient documentation

## 2017-12-18 DIAGNOSIS — Y9389 Activity, other specified: Secondary | ICD-10-CM | POA: Diagnosis not present

## 2017-12-18 DIAGNOSIS — F329 Major depressive disorder, single episode, unspecified: Secondary | ICD-10-CM | POA: Diagnosis not present

## 2017-12-18 DIAGNOSIS — Y998 Other external cause status: Secondary | ICD-10-CM | POA: Insufficient documentation

## 2017-12-18 DIAGNOSIS — F17210 Nicotine dependence, cigarettes, uncomplicated: Secondary | ICD-10-CM | POA: Diagnosis not present

## 2017-12-18 DIAGNOSIS — Y92410 Unspecified street and highway as the place of occurrence of the external cause: Secondary | ICD-10-CM | POA: Diagnosis not present

## 2017-12-18 DIAGNOSIS — J45909 Unspecified asthma, uncomplicated: Secondary | ICD-10-CM | POA: Insufficient documentation

## 2017-12-18 DIAGNOSIS — F1290 Cannabis use, unspecified, uncomplicated: Secondary | ICD-10-CM | POA: Diagnosis not present

## 2017-12-18 DIAGNOSIS — S161XXA Strain of muscle, fascia and tendon at neck level, initial encounter: Secondary | ICD-10-CM | POA: Diagnosis not present

## 2017-12-18 DIAGNOSIS — S199XXA Unspecified injury of neck, initial encounter: Secondary | ICD-10-CM | POA: Diagnosis present

## 2017-12-18 MED ORDER — KETOROLAC TROMETHAMINE 30 MG/ML IJ SOLN
30.0000 mg | Freq: Once | INTRAMUSCULAR | Status: AC
  Administered 2017-12-18: 30 mg via INTRAMUSCULAR
  Filled 2017-12-18: qty 1
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
## 2017-12-21 ENCOUNTER — Ambulatory Visit: Payer: Medicaid Other | Admitting: Family Medicine

## 2017-12-21 ENCOUNTER — Encounter: Payer: Self-pay | Admitting: Family Medicine

## 2017-12-21 DIAGNOSIS — S060XAA Concussion with loss of consciousness status unknown, initial encounter: Secondary | ICD-10-CM

## 2017-12-21 DIAGNOSIS — S060X0A Concussion without loss of consciousness, initial encounter: Secondary | ICD-10-CM

## 2017-12-21 DIAGNOSIS — S060X9A Concussion with loss of consciousness of unspecified duration, initial encounter: Secondary | ICD-10-CM | POA: Insufficient documentation

## 2017-12-21 HISTORY — DX: Concussion with loss of consciousness status unknown, initial encounter: S06.0XAA

## 2017-12-21 MED ORDER — PREDNISONE 50 MG PO TABS: 50.0000 mg | ORAL_TABLET | Freq: Every day | ORAL | 0 refills | Status: DC
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
## 2017-12-21 NOTE — Patient Instructions (Addendum)
Good to see you  Prednisone daily for 5 days 4 hour shifts at work for a week Over the counter get  Fish oil 3 grams daily for 10 days  Iron 65mg  with 500mg  of vitamin C CoQ10 200mg  daily for the headaches See me again in 1 weeks

## 2017-12-28 ENCOUNTER — Encounter: Payer: Self-pay | Admitting: Family Medicine

## 2017-12-28 ENCOUNTER — Ambulatory Visit: Payer: Medicaid Other | Admitting: Family Medicine

## 2017-12-28 DIAGNOSIS — S060X0D Concussion without loss of consciousness, subsequent encounter: Secondary | ICD-10-CM

## 2017-12-28 NOTE — Patient Instructions (Addendum)
Good to see you  Happy Birthday!!!! Continue the vitamins until the symptoms resolve OK to work just be mindful  Make an appointment in 3 weeks just in case but you should be perfect before then

## 2017-12-28 NOTE — Assessment & Plan Note (Signed)
Patient seems to be improving at this time.  Physical exam is unremarkable.  We discussed icing regimen and home exercises.  Discussed which activities to do which wants to avoid.  Patient is going to continue the vitamin supplementations and follow-up with me again in 3 weeks
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
## 2018-01-18 ENCOUNTER — Ambulatory Visit: Payer: Medicaid Other | Admitting: Family Medicine

## 2018-01-19 ENCOUNTER — Ambulatory Visit (HOSPITAL_COMMUNITY)
Admission: EM | Admit: 2018-01-19 | Discharge: 2018-01-19 | Disposition: A | Discharge status: Home / Self Care | Payer: Medicaid Other | Attending: Family Medicine | Admitting: Family Medicine

## 2018-01-19 ENCOUNTER — Encounter (HOSPITAL_COMMUNITY): Payer: Self-pay

## 2018-01-19 DIAGNOSIS — G43009 Migraine without aura, not intractable, without status migrainosus: Secondary | ICD-10-CM | POA: Diagnosis not present

## 2018-01-19 MED ORDER — KETOROLAC TROMETHAMINE 60 MG/2ML IM SOLN
INTRAMUSCULAR | Status: AC
  Filled 2018-01-19: qty 2

## 2018-01-19 MED ORDER — DEXAMETHASONE SODIUM PHOSPHATE 10 MG/ML IJ SOLN
10.0000 mg | Freq: Once | INTRAMUSCULAR | Status: AC
  Administered 2018-01-19: 10 mg via INTRAMUSCULAR

## 2018-01-19 MED ORDER — DIPHENHYDRAMINE HCL 50 MG/ML IJ SOLN
25.0000 mg | Freq: Once | INTRAMUSCULAR | Status: AC
  Administered 2018-01-19: 25 mg via INTRAVENOUS

## 2018-01-19 MED ORDER — KETOROLAC TROMETHAMINE 60 MG/2ML IM SOLN
60.0000 mg | Freq: Once | INTRAMUSCULAR | Status: AC
  Administered 2018-01-19: 60 mg via INTRAMUSCULAR

## 2018-01-19 MED ORDER — DIPHENHYDRAMINE HCL 50 MG/ML IJ SOLN
INTRAMUSCULAR | Status: AC
  Filled 2018-01-19: qty 1

## 2018-01-19 MED ORDER — DEXAMETHASONE SODIUM PHOSPHATE 10 MG/ML IJ SOLN
INTRAMUSCULAR | Status: AC
  Filled 2018-01-19: qty 1

## 2018-01-19 NOTE — ED Provider Notes (Signed)
[facility]    CSN: [id number] Arrival date & time: 01/19/18  4098     History   Chief Complaint Chief Complaint  Patient presents with  . Headache    HPI Lori Lam is a 22 y.o. female.    Headache  Pain location:  Occipital (left) Quality:  Dull (throbbing) Radiates to:  Does not radiate Severity currently:  8/10 Severity at highest:  10/10 Onset quality:  Sudden Duration:  1 day Timing:  Constant Progression:  Unchanged Chronicity:  Recurrent Similar to prior headaches: yes   Relieved by:  Nothing Worsened by:  Activity, light and sound Ineffective treatments:  Acetaminophen and NSAIDs Associated symptoms: nausea and photophobia   Associated symptoms: no blurred vision, no congestion, no cough, no dizziness, no ear pain, no eye pain, no fatigue, no fever, no focal weakness, no hearing loss, no loss of balance, no myalgias, no near-syncope, no neck pain, no neck stiffness, no numbness, no paresthesias, no seizures, no sinus pressure, no sore throat, no swollen glands, no syncope, no tingling, no URI, no visual change, no vomiting and no weakness     Past Medical History:  Diagnosis Date  . Anemia   . Anxiety   . Asthma   . Chlamydia contact, treated   . GERD (gastroesophageal reflux disease)     Patient Active Problem List   Diagnosis Date Noted  . Mild concussion 12/21/2017  . Encounter for initial prescription of implantable subdermal contraceptive 01/27/2017  . H/O chlamydia infection 12/28/2012  . Asthma 12/28/2012  . Depression 12/28/2012  . Marijuana use 12/28/2012    Past Surgical History:  Procedure Laterality Date  . HERNIA REPAIR      OB History    Gravida  2   Para  2   Term  2   Preterm  0   AB  0   Living  2     SAB  0   TAB  0   Ectopic  0   Multiple  0   Live Births  2            Home Medications    Prior to Admission medications   Medication Sig Start Date End Date Taking? Authorizing  Provider  artificial tears (LACRILUBE) OINT ophthalmic ointment Place into the left eye 3 (three) times daily. 12/08/17   Dahlia Byes A, NP  medroxyPROGESTERone (DEPO-PROVERA) 150 MG/ML injection Inject 1 mL (150 mg total) into the muscle every 3 (three) months. 10/31/16   Orvilla Cornwall A, CNM  ondansetron (ZOFRAN) 4 MG tablet Take 1 tablet (4 mg total) by mouth every 6 (six) hours. 11/27/17   Law, Waylan Boga, PA-C  predniSONE (DELTASONE) 50 MG tablet Take 1 tablet (50 mg total) by mouth daily. 12/21/17   Judi Saa, DO  Prenatal Multivit-Min-Fe-FA (PRENATAL VITAMINS) 0.8 MG tablet Take 1 tablet by mouth daily. 03/09/16   Adam Phenix, MD  traMADol (ULTRAM) 50 MG tablet Take 1 tablet (50 mg total) by mouth every 6 (six) hours as needed. 12/08/17   Janace Aris, NP    Family History Family History  Problem Relation Age of Onset  . Hypertension Other   . Diabetes Other   . Hypertension Maternal Grandmother   . Hypertension Maternal Grandfather   . Hypertension Paternal Grandmother     Social History Social History   Tobacco Use  . Smoking status: Current Every Day Smoker    Packs/day: 0.50    Types: Cigarettes  Last attempt to quit: 11/15/2015    Years since quitting: 2.1  . Smokeless tobacco: Never Used  Substance Use Topics  . Alcohol use: No    Comment: socially  . Drug use: No     Allergies   Patient has no known allergies.   Review of Systems Review of Systems  Constitutional: Negative for fatigue and fever.  HENT: Negative for congestion, ear pain, hearing loss, sinus pressure and sore throat.   Eyes: Positive for photophobia. Negative for blurred vision and pain.  Respiratory: Negative for cough.   Cardiovascular: Negative for syncope and near-syncope.  Gastrointestinal: Positive for nausea. Negative for vomiting.  Musculoskeletal: Negative for myalgias, neck pain and neck stiffness.  Neurological: Positive for headaches. Negative for dizziness, focal  weakness, seizures, weakness, numbness, paresthesias and loss of balance.     Physical Exam Triage Vital Signs ED Triage Vitals [01/19/18 0828]  Enc Vitals Group     BP 133/90     Pulse Rate 88     Resp 20     Temp 97.8 F (36.6 C)     Temp Source Oral     SpO2 100 %     Weight      Height      Head Circumference      Peak Flow      Pain Score      Pain Loc      Pain Edu?      Excl. in GC?    No data found.  Updated Vital Signs BP 133/90 (BP Location: Right Arm)   Pulse 88   Temp 97.8 F (36.6 C) (Oral)   Resp 20   SpO2 100%   Visual Acuity Right Eye Distance:   Left Eye Distance:   Bilateral Distance:    Right Eye Near:   Left Eye Near:    Bilateral Near:     Physical Exam  Constitutional: She is oriented to person, place, and time. She appears well-developed and well-nourished.  Very pleasant. Non toxic or ill appearing.     HENT:  Head: Normocephalic and atraumatic.  Eyes: Pupils are equal, round, and reactive to light. EOM are normal. Right eye exhibits no nystagmus. Left eye exhibits no nystagmus.  Neck: Normal range of motion.  Pulmonary/Chest: Effort normal.  Musculoskeletal: Normal range of motion.  Neurological: She is alert and oriented to person, place, and time.  No focal neuro deficits. Strength 5/5. Cranial nerves intact.   Skin: Skin is warm and dry.  Psychiatric: She has a normal mood and affect.  Nursing note and vitals reviewed.    UC Treatments / Results  Labs (all labs ordered are listed, but only abnormal results are displayed) Labs Reviewed - No data to display  EKG None  Radiology No results found.  Procedures Procedures (including critical care time)  Medications Ordered in UC Medications  ketorolac (TORADOL) injection 60 mg (60 mg Intramuscular Given 01/19/18 0902)  dexamethasone (DECADRON) injection 10 mg (10 mg Intramuscular Given 01/19/18 0904)  diphenhydrAMINE (BENADRYL) injection 25 mg (25 mg Intravenous Given  01/19/18 0905)    Initial Impression / Assessment and Plan / UC Course  I have reviewed the triage vital signs and the nursing notes.  Pertinent labs & imaging results that were available during my care of the patient were reviewed by me and considered in my medical decision making (see chart for details).     Recurrent migraine- will treat with migraine cocktail in clinic.  Contact given  and told to follow-up with neurology for recurrent migraines. Patient understanding and agreeable to plan Final Clinical Impressions(s) / UC Diagnoses   Final diagnoses:  Migraine without aura and without status migrainosus, not intractable     Discharge Instructions     We treated you for your migraine I gave you a contact for neurologist to follow-up for recurrent migraines     ED Prescriptions    None     Controlled Substance Prescriptions [place] Controlled Substance Registry consulted? Not Applicable   Janace Aris, NP 01/19/18 [phone]

## 2018-01-19 NOTE — Discharge Instructions (Addendum)
We treated you for your migraine I gave you a contact for neurologist to follow-up for recurrent migraines

## 2018-01-19 NOTE — ED Triage Notes (Signed)
Pt presents with ongoing persistent severe headache that is unrelieved with OTC medication.

## 2018-08-03 ENCOUNTER — Encounter (HOSPITAL_COMMUNITY): Payer: Self-pay | Admitting: Emergency Medicine

## 2018-08-03 ENCOUNTER — Other Ambulatory Visit: Payer: Self-pay

## 2018-08-03 ENCOUNTER — Ambulatory Visit (HOSPITAL_COMMUNITY)
Admission: EM | Admit: 2018-08-03 | Discharge: 2018-08-03 | Disposition: A | Discharge status: Home / Self Care | Payer: Medicaid Other | Attending: Family Medicine | Admitting: Family Medicine

## 2018-08-03 DIAGNOSIS — R05 Cough: Secondary | ICD-10-CM

## 2018-08-03 DIAGNOSIS — R0981 Nasal congestion: Secondary | ICD-10-CM

## 2018-08-03 DIAGNOSIS — B349 Viral infection, unspecified: Secondary | ICD-10-CM

## 2018-08-03 DIAGNOSIS — J029 Acute pharyngitis, unspecified: Secondary | ICD-10-CM

## 2018-08-03 DIAGNOSIS — J3489 Other specified disorders of nose and nasal sinuses: Secondary | ICD-10-CM

## 2018-08-03 LAB — POCT RAPID STREP A: Streptococcus, Group A Screen (Direct): NEGATIVE

## 2018-08-03 MED ORDER — ALBUTEROL SULFATE HFA 108 (90 BASE) MCG/ACT IN AERS: 1.0000 | INHALATION_SPRAY | Freq: Four times a day (QID) | RESPIRATORY_TRACT | 0 refills | Status: DC | PRN

## 2018-08-03 MED ORDER — DEXAMETHASONE SODIUM PHOSPHATE 10 MG/ML IJ SOLN
10.0000 mg | Freq: Once | INTRAMUSCULAR | Status: AC
  Administered 2018-08-03: 12:00:00 10 mg via INTRAMUSCULAR

## 2018-08-03 MED ORDER — CETIRIZINE HCL 10 MG PO TABS: 10.0000 mg | ORAL_TABLET | Freq: Every day | ORAL | 0 refills | Status: DC

## 2018-08-03 MED ORDER — DEXAMETHASONE SODIUM PHOSPHATE 10 MG/ML IJ SOLN
INTRAMUSCULAR | Status: AC
  Filled 2018-08-03: qty 1

## 2018-08-03 MED ORDER — FLUTICASONE PROPIONATE 50 MCG/ACT NA SUSP: 1.0000 | Freq: Every day | NASAL | 2 refills | Status: DC

## 2018-08-03 NOTE — ED Triage Notes (Signed)
Pt sts URI sx x 2 days with body aches and sore throat and cough

## 2018-08-03 NOTE — ED Provider Notes (Signed)
[facility]    CSN: [id number] Arrival date & time: 08/03/18  1024     History   Chief Complaint Chief Complaint  Patient presents with  . URI    HPI Lori Lam is a 23 y.o. female.   She has a 23 year old female the presents today with 2 days of cough, congestion, sore throat, sinus pressure, nasal congestion and rhinorrhea.  Symptoms have been constant and remain the same.  She has been taking over-the-counter multisymptom cold medication without much relief.  Her biggest complaint is her sore throat and sinus pressure.  She is having pain with swallowing.  She is not coughing up any mucus.  Denies any history of allergies.  No fevers, chills, body aches.  No recent sick contacts or recent traveling.  ROS per HPI      Past Medical History:  Diagnosis Date  . Anemia   . Anxiety   . Asthma   . Chlamydia contact, treated   . GERD (gastroesophageal reflux disease)     Patient Active Problem List   Diagnosis Date Noted  . Mild concussion 12/21/2017  . Encounter for initial prescription of implantable subdermal contraceptive 01/27/2017  . H/O chlamydia infection 12/28/2012  . Asthma 12/28/2012  . Depression 12/28/2012  . Marijuana use 12/28/2012    Past Surgical History:  Procedure Laterality Date  . HERNIA REPAIR      OB History    Gravida  2   Para  2   Term  2   Preterm  0   AB  0   Living  2     SAB  0   TAB  0   Ectopic  0   Multiple  0   Live Births  2            Home Medications    Prior to Admission medications   Medication Sig Start Date End Date Taking? Authorizing Provider  albuterol (PROVENTIL HFA;VENTOLIN HFA) 108 (90 Base) MCG/ACT inhaler Inhale 1-2 puffs into the lungs every 6 (six) hours as needed for wheezing or shortness of breath. 08/03/18   Dahlia Byes A, NP  artificial tears (LACRILUBE) OINT ophthalmic ointment Place into the left eye 3 (three) times daily. 12/08/17   Dahlia Byes A, NP  cetirizine  (ZYRTEC) 10 MG tablet Take 1 tablet (10 mg total) by mouth daily. 08/03/18   Ernie Kasler, Gloris Manchester A, NP  fluticasone (FLONASE) 50 MCG/ACT nasal spray Place 1 spray into both nostrils daily. 08/03/18   Dahlia Byes A, NP  medroxyPROGESTERone (DEPO-PROVERA) 150 MG/ML injection Inject 1 mL (150 mg total) into the muscle every 3 (three) months. 10/31/16   Orvilla Cornwall A, CNM  ondansetron (ZOFRAN) 4 MG tablet Take 1 tablet (4 mg total) by mouth every 6 (six) hours. 11/27/17   Emi Holes, PA-C  Prenatal Multivit-Min-Fe-FA (PRENATAL VITAMINS) 0.8 MG tablet Take 1 tablet by mouth daily. 03/09/16   Adam Phenix, MD  traMADol (ULTRAM) 50 MG tablet Take 1 tablet (50 mg total) by mouth every 6 (six) hours as needed. 12/08/17   Janace Aris, NP    Family History Family History  Problem Relation Age of Onset  . Hypertension Other   . Diabetes Other   . Hypertension Maternal Grandmother   . Hypertension Maternal Grandfather   . Hypertension Paternal Grandmother     Social History Social History   Tobacco Use  . Smoking status: Current Every Day Smoker    Packs/day: 0.50  Types: Cigarettes    Last attempt to quit: 11/15/2015    Years since quitting: 2.7  . Smokeless tobacco: Never Used  Substance Use Topics  . Alcohol use: No    Comment: socially  . Drug use: No     Allergies   Patient has no known allergies.   Review of Systems Review of Systems   Physical Exam Triage Vital Signs ED Triage Vitals  Enc Vitals Group     BP 08/03/18 1051 (!) 136/92     Pulse Rate 08/03/18 1051 76     Resp 08/03/18 1051 18     Temp 08/03/18 1051 98.9 F (37.2 C)     Temp Source 08/03/18 1051 Oral     SpO2 08/03/18 1051 100 %     Weight --      Height --      Head Circumference --      Peak Flow --      Pain Score 08/03/18 1052 5     Pain Loc --      Pain Edu? --      Excl. in GC? --    No data found.  Updated Vital Signs BP (!) 136/92 (BP Location: Left Arm)   Pulse 76   Temp 98.9 F (37.2  C) (Oral)   Resp 18   SpO2 100%   Visual Acuity Right Eye Distance:   Left Eye Distance:   Bilateral Distance:    Right Eye Near:   Left Eye Near:    Bilateral Near:     Physical Exam Vitals signs and nursing note reviewed.  Constitutional:      General: She is not in acute distress.    Appearance: Normal appearance. She is not ill-appearing, toxic-appearing or diaphoretic.  HENT:     Head: Normocephalic and atraumatic.     Right Ear: Tympanic membrane and ear canal normal.     Left Ear: There is impacted cerumen.     Nose: Congestion and rhinorrhea present.     Mouth/Throat:     Pharynx: Oropharynx is clear. Posterior oropharyngeal erythema present.     Tonsils: Tonsillar exudate present. 2+ on the right. 2+ on the left.  Eyes:     Conjunctiva/sclera: Conjunctivae normal.  Neck:     Musculoskeletal: Normal range of motion.  Cardiovascular:     Rate and Rhythm: Normal rate and regular rhythm.     Pulses: Normal pulses.     Heart sounds: Normal heart sounds.  Pulmonary:     Effort: Pulmonary effort is normal.     Breath sounds: Normal breath sounds.  Musculoskeletal: Normal range of motion.  Lymphadenopathy:     Cervical: No cervical adenopathy.  Skin:    General: Skin is warm and dry.  Neurological:     Mental Status: She is alert.  Psychiatric:        Mood and Affect: Mood normal.      UC Treatments / Results  Labs (all labs ordered are listed, but only abnormal results are displayed) Labs Reviewed  POCT RAPID STREP A    EKG None  Radiology No results found.  Procedures Procedures (including critical care time)  Medications Ordered in UC Medications  dexamethasone (DECADRON) injection 10 mg (10 mg Intramuscular Given 08/03/18 1152)    Initial Impression / Assessment and Plan / UC Course  I have reviewed the triage vital signs and the nursing notes.  Pertinent labs & imaging results that were available during my care of  the patient were  reviewed by me and considered in my medical decision making (see chart for details).     Rapid strep test negative This is most likely a viral illness Treating symptoms in clinic with dexamethasone injection mostly for sinus pressure and headache Sending home with Flonase, Zyrtec and albuterol inhaler to use as needed Follow up as needed for continued or worsening symptoms  Final Clinical Impressions(s) / UC Diagnoses   Final diagnoses:  Viral syndrome     Discharge Instructions     Your strep test is negative This is most likely a viral illness Dexamethasone injection given here for sinus headache and sore throat with tonsillar swelling and inflammation You can continue with over-the-counter ibuprofen or Tylenol for pain, fever or body aches I recommend starting with Flonase nasal spray and a daily allergy pill for symptoms Follow up as needed for continued or worsening symptoms       ED Prescriptions    Medication Sig Dispense Auth. Provider   fluticasone (FLONASE) 50 MCG/ACT nasal spray Place 1 spray into both nostrils daily. 16 g Keston Seever A, NP   cetirizine (ZYRTEC) 10 MG tablet Take 1 tablet (10 mg total) by mouth daily. 30 tablet Jeff Mccallum A, NP   albuterol (PROVENTIL HFA;VENTOLIN HFA) 108 (90 Base) MCG/ACT inhaler Inhale 1-2 puffs into the lungs every 6 (six) hours as needed for wheezing or shortness of breath. 1 Inhaler Dahlia Byes A, NP     Controlled Substance Prescriptions [place] Controlled Substance Registry consulted? Not Applicable   Janace Aris, NP 08/03/18 1209

## 2018-08-03 NOTE — Discharge Instructions (Addendum)
Your strep test is negative This is most likely a viral illness Dexamethasone injection given here for sinus headache and sore throat with tonsillar swelling and inflammation You can continue with over-the-counter ibuprofen or Tylenol for pain, fever or body aches I recommend starting with Flonase nasal spray and a daily allergy pill for symptoms Follow up as needed for continued or worsening symptoms

## 2018-09-10 ENCOUNTER — Ambulatory Visit (HOSPITAL_COMMUNITY)
Admission: EM | Admit: 2018-09-10 | Discharge: 2018-09-10 | Disposition: A | Discharge status: Home / Self Care | Payer: Self-pay | Attending: Family Medicine | Admitting: Family Medicine

## 2018-09-10 ENCOUNTER — Encounter (HOSPITAL_COMMUNITY): Payer: Self-pay

## 2018-09-10 ENCOUNTER — Other Ambulatory Visit: Payer: Self-pay

## 2018-09-10 DIAGNOSIS — N760 Acute vaginitis: Secondary | ICD-10-CM | POA: Insufficient documentation

## 2018-09-10 MED ORDER — FLUCONAZOLE 150 MG PO TABS: 150.0000 mg | ORAL_TABLET | Freq: Once | ORAL | 0 refills | Status: AC

## 2018-09-10 MED ORDER — MICONAZOLE NITRATE 2 % VA CREA: TOPICAL_CREAM | VAGINAL | 0 refills | Status: DC

## 2018-09-10 NOTE — ED Triage Notes (Signed)
Patient presents to Urgent Care with complaints of vaginal itching and burning since using "detox perles" in her vaginal area. Patient reports she would also like STD testing.

## 2018-09-10 NOTE — Discharge Instructions (Signed)
Your symptoms sound concerning for yeast, therefore we will treat this with a one time pill.  May use a topical cream as needed externally as well, thin amount twice a day until resolution.  Will notify you of any positive findings from your vaginal sample and if any changes to treatment are needed. You may monitor your results on your MyChart online as well.   Do not put anything into the vagina, douche etc as this can lead to imbalances and infection.  If symptoms worsen or do not improve in the next week to return to be seen or to follow up with your PCP.

## 2018-09-10 NOTE — ED Provider Notes (Signed)
[facility]    CSN: [id number] Arrival date & time: 09/10/18  [id number]     History   Chief Complaint Chief Complaint  Patient presents with  . Vaginal Itching    HPI Lori CzechMaryann A Lam is a 23 y.o. female.   Lori Lam presents with complaints of vulvar and vaginal itching and burning which started three days ago. This occurred after she has used "detox perles" which apparently are vaginal perles which are inserted for 2-3 days to provide systemic "detox" and "purging of toxins". This is the first time she has tried using these. Had not vaginal complaints prior to insertion. She removed them after two days and then developed symptoms. States she has vulvar irritation from itching so much. Denies  Sores or lesions. Has creamy white  Vaginal discharge. Denies urinary symptoms. Has tried applying vaseline which minimally helped. Denies any previous similar but has had BV and std in the past. Has 1 partner but does not use condoms. No specific known concern for std. No abdominal pain or gi complaints. Irregular periods, had spotting recently, has a nexplanon and this is typical for her.     ROS per HPI, negative if not otherwise mentioned.      Past Medical History:  Diagnosis Date  . Anemia   . Anxiety   . Asthma   . Chlamydia contact, treated   . GERD (gastroesophageal reflux disease)     Patient Active Problem List   Diagnosis Date Noted  . Mild concussion 12/21/2017  . Encounter for initial prescription of implantable subdermal contraceptive 01/27/2017  . H/O chlamydia infection 12/28/2012  . Asthma 12/28/2012  . Depression 12/28/2012  . Marijuana use 12/28/2012    Past Surgical History:  Procedure Laterality Date  . HERNIA REPAIR      OB History    Gravida  2   Para  2   Term  2   Preterm  0   AB  0   Living  2     SAB  0   TAB  0   Ectopic  0   Multiple  0   Live Births  2            Home Medications    Prior to  Admission medications   Medication Sig Start Date End Date Taking? Authorizing Provider  albuterol (PROVENTIL HFA;VENTOLIN HFA) 108 (90 Base) MCG/ACT inhaler Inhale 1-2 puffs into the lungs every 6 (six) hours as needed for wheezing or shortness of breath. 08/03/18   Dahlia ByesBast, Traci A, NP  artificial tears (LACRILUBE) OINT ophthalmic ointment Place into the left eye 3 (three) times daily. 12/08/17   Dahlia ByesBast, Traci A, NP  cetirizine (ZYRTEC) 10 MG tablet Take 1 tablet (10 mg total) by mouth daily. 08/03/18   Dahlia ByesBast, Traci A, NP  fluconazole (DIFLUCAN) 150 MG tablet Take 1 tablet (150 mg total) by mouth once for 1 dose. 09/10/18 09/10/18  Georgetta HaberBurky, Natalie B, NP  fluticasone (FLONASE) 50 MCG/ACT nasal spray Place 1 spray into both nostrils daily. 08/03/18   Dahlia ByesBast, Traci A, NP  miconazole (MONISTAT 7) 2 % vaginal cream Topically to vulva twice a day as needed for itching 09/10/18   Georgetta HaberBurky, Natalie B, NP    Family History Family History  Problem Relation Age of Onset  . Hypertension Other   . Diabetes Other   . Hypertension Maternal Grandmother   . Hypertension Maternal Grandfather   . Hypertension Paternal Grandmother     Social  History Social History   Tobacco Use  . Smoking status: Current Some Day Smoker    Packs/day: 0.50    Types: Cigarettes    Last attempt to quit: 11/15/2015    Years since quitting: 2.8  . Smokeless tobacco: Never Used  Substance Use Topics  . Alcohol use: No    Comment: socially  . Drug use: No     Allergies   Patient has no known allergies.   Review of Systems Review of Systems   Physical Exam Triage Vital Signs ED Triage Vitals  Enc Vitals Group     BP 09/10/18 0941 131/72     Pulse Rate 09/10/18 0941 72     Resp 09/10/18 0941 18     Temp 09/10/18 0941 98.8 F (37.1 C)     Temp Source 09/10/18 0941 Oral     SpO2 09/10/18 0941 100 %     Weight 09/10/18 0938 216 lb (98 kg)     Height 09/10/18 0938 5\' 6"  (1.676 m)     Head Circumference --      Peak Flow --       Pain Score 09/10/18 0938 2     Pain Loc --      Pain Edu? --      Excl. in GC? --    No data found.  Updated Vital Signs BP 131/72 (BP Location: Right Arm)   Pulse 72   Temp 98.8 F (37.1 C) (Oral)   Resp 18   Ht 5\' 6"  (1.676 m)   Wt 216 lb (98 kg)   SpO2 100%   BMI 34.86 kg/m   Visual Acuity Right Eye Distance:   Left Eye Distance:   Bilateral Distance:    Right Eye Near:   Left Eye Near:    Bilateral Near:     Physical Exam Constitutional:      General: She is not in acute distress.    Appearance: She is well-developed.  Cardiovascular:     Rate and Rhythm: Normal rate and regular rhythm.     Heart sounds: Normal heart sounds.  Pulmonary:     Effort: Pulmonary effort is normal.     Breath sounds: Normal breath sounds.  Abdominal:     Palpations: Abdomen is soft. Abdomen is not rigid.     Tenderness: There is no abdominal tenderness. There is no guarding or rebound.  Genitourinary:    Comments: Denies sores, lesions, vaginal bleeding; no pelvic pain; gu exam deferred at this time, vaginal self swab collected.   Skin:    General: Skin is warm and dry.  Neurological:     Mental Status: She is alert and oriented to person, place, and time.      UC Treatments / Results  Labs (all labs ordered are listed, but only abnormal results are displayed) Labs Reviewed  CERVICOVAGINAL ANCILLARY ONLY    EKG None  Radiology No results found.  Procedures Procedures (including critical care time)  Medications Ordered in UC Medications - No data to display  Initial Impression / Assessment and Plan / UC Course  I have reviewed the triage vital signs and the nursing notes.  Pertinent labs & imaging results that were available during my care of the patient were reviewed by me and considered in my medical decision making (see chart for details).     Itching and irritation from itching s/p use of vaginal perles. Encouraged to discontinue any future use. Allergic  response vs yeast vs bv  vs std considered. Vaginal cytology self swab collected and pending. Patient verbalized understanding and agreeable to plan.    Final Clinical Impressions(s) / UC Diagnoses   Final diagnoses:  Vaginitis and vulvovaginitis     Discharge Instructions     Your symptoms sound concerning for yeast, therefore we will treat this with a one time pill.  May use a topical cream as needed externally as well, thin amount twice a day until resolution.  Will notify you of any positive findings from your vaginal sample and if any changes to treatment are needed. You may monitor your results on your MyChart online as well.   Do not put anything into the vagina, douche etc as this can lead to imbalances and infection.  If symptoms worsen or do not improve in the next week to return to be seen or to follow up with your PCP.      ED Prescriptions    Medication Sig Dispense Auth. Provider   miconazole (MONISTAT 7) 2 % vaginal cream Topically to vulva twice a day as needed for itching 45 g Linus Mako B, NP   fluconazole (DIFLUCAN) 150 MG tablet Take 1 tablet (150 mg total) by mouth once for 1 dose. 1 tablet Georgetta Haber, NP     Controlled Substance Prescriptions [place] Controlled Substance Registry consulted? Not Applicable   Georgetta Haber, NP 09/10/18 1017

## 2018-09-11 LAB — CERVICOVAGINAL ANCILLARY ONLY
Bacterial vaginitis: POSITIVE — AB
Candida vaginitis: POSITIVE — AB
Chlamydia: NEGATIVE
Neisseria Gonorrhea: POSITIVE — AB
Trichomonas: NEGATIVE

## 2018-09-13 ENCOUNTER — Other Ambulatory Visit: Payer: Self-pay

## 2018-09-13 ENCOUNTER — Telehealth (HOSPITAL_COMMUNITY): Payer: Self-pay | Admitting: Emergency Medicine

## 2018-09-13 ENCOUNTER — Ambulatory Visit (HOSPITAL_COMMUNITY)
Admission: EM | Admit: 2018-09-13 | Discharge: 2018-09-13 | Disposition: A | Discharge status: Home / Self Care | Payer: Self-pay | Attending: Family Medicine | Admitting: Family Medicine

## 2018-09-13 DIAGNOSIS — A549 Gonococcal infection, unspecified: Secondary | ICD-10-CM

## 2018-09-13 MED ORDER — METRONIDAZOLE 500 MG PO TABS: 500.0000 mg | ORAL_TABLET | Freq: Two times a day (BID) | ORAL | 0 refills | Status: AC

## 2018-09-13 MED ORDER — CEFTRIAXONE SODIUM 250 MG IJ SOLR
250.0000 mg | Freq: Once | INTRAMUSCULAR | Status: AC
  Administered 2018-09-13: 250 mg via INTRAMUSCULAR

## 2018-09-13 MED ORDER — AZITHROMYCIN 250 MG PO TABS
1000.0000 mg | ORAL_TABLET | Freq: Once | ORAL | Status: AC
Start: 2018-09-13 — End: 2018-09-13
  Administered 2018-09-13: 1000 mg via ORAL

## 2018-09-13 MED ORDER — AZITHROMYCIN 250 MG PO TABS
ORAL_TABLET | ORAL | Status: AC
  Filled 2018-09-13: qty 4

## 2018-09-13 MED ORDER — CEFTRIAXONE SODIUM 250 MG IJ SOLR
INTRAMUSCULAR | Status: AC
  Filled 2018-09-13: qty 250

## 2018-09-13 NOTE — ED Notes (Signed)
Pt here for std treatment.  

## 2018-09-13 NOTE — Telephone Encounter (Signed)
Candida (yeast) is positive.  Prescription for fluconazole was given at the urgent care visit.    Bacterial vaginosis is positive. This was not treated at the urgent care visit.  Flagyl 500 mg BID x 7 days #14 no refills sent to patients pharmacy of choice.    Gonorrhea is positive.  Patient should return as soon as possible to the urgent care for treatment with IM rocephin 250mg  and po zithromax 1g. Patient will not need to see a provider unless there are new symptoms she would like evaluated. Pt needs education to refrain from sexual intercourse for now and for 7 days after treatment to give the medicine time to work. Sexual partners need to be notified and tested/treated. Condoms may reduce risk of reinfection. [facility] notified.   Patient contacted and made aware of all results, all questions answered.  Pt will return today for treatment.

## 2018-09-28 ENCOUNTER — Other Ambulatory Visit: Payer: Self-pay

## 2018-09-28 ENCOUNTER — Encounter (HOSPITAL_COMMUNITY): Payer: Self-pay

## 2018-09-28 ENCOUNTER — Ambulatory Visit (HOSPITAL_COMMUNITY)
Admission: EM | Admit: 2018-09-28 | Discharge: 2018-09-28 | Disposition: A | Discharge status: Home / Self Care | Payer: Medicaid Other | Attending: Family Medicine | Admitting: Family Medicine

## 2018-09-28 DIAGNOSIS — G43119 Migraine with aura, intractable, without status migrainosus: Secondary | ICD-10-CM

## 2018-09-28 MED ORDER — KETOROLAC TROMETHAMINE 60 MG/2ML IM SOLN
60.0000 mg | Freq: Once | INTRAMUSCULAR | Status: AC
  Administered 2018-09-28: 60 mg via INTRAMUSCULAR

## 2018-09-28 MED ORDER — ONDANSETRON HCL 4 MG PO TABS: 4.0000 mg | ORAL_TABLET | Freq: Four times a day (QID) | ORAL | 0 refills | Status: DC

## 2018-09-28 MED ORDER — DEXAMETHASONE SODIUM PHOSPHATE 10 MG/ML IJ SOLN
10.0000 mg | Freq: Once | INTRAMUSCULAR | Status: AC
  Administered 2018-09-28: 10 mg via INTRAMUSCULAR

## 2018-09-28 MED ORDER — DEXAMETHASONE SODIUM PHOSPHATE 10 MG/ML IJ SOLN
INTRAMUSCULAR | Status: AC
Start: 2018-09-28 — End: ?
  Filled 2018-09-28: qty 1

## 2018-09-28 MED ORDER — KETOROLAC TROMETHAMINE 60 MG/2ML IM SOLN
INTRAMUSCULAR | Status: AC
  Filled 2018-09-28: qty 2

## 2018-09-28 MED ORDER — IBUPROFEN 800 MG PO TABS: 800.0000 mg | ORAL_TABLET | Freq: Three times a day (TID) | ORAL | 0 refills | Status: DC

## 2018-09-28 NOTE — ED Triage Notes (Signed)
Pt presents with chronic migraine that causes photo sensitivity, nausea, and dizziness X 2 days.

## 2018-09-28 NOTE — ED Provider Notes (Signed)
[facility]    CSN: [id number] Arrival date & time: 09/28/18  1448     History   Chief Complaint Chief Complaint  Patient presents with  . Migraine  . Nausea    HPI Lori Lam is a 23 y.o. female.   HPI  Patient has a longstanding history of migraine headaches.  Usually she can treat them at home.  She has had a migraine now for 2 days.  She states that she has light sensitivity.  Headache.  Nausea.  No vomiting.  She tried ibuprofen at home.  This did not help.  She was unable to sleep last night.  Appetite is poor.  She has tried to drink enough fluids. She denies any infection sinus drainage. She denies any head trauma or injury. No neuro symptoms, numbness or weakness in arms or legs, difficulty with vision hearing, behavior changes  Past Medical History:  Diagnosis Date  . Anemia   . Anxiety   . Asthma   . Chlamydia contact, treated   . GERD (gastroesophageal reflux disease)     Patient Active Problem List   Diagnosis Date Noted  . Mild concussion 12/21/2017  . Encounter for initial prescription of implantable subdermal contraceptive 01/27/2017  . H/O chlamydia infection 12/28/2012  . Asthma 12/28/2012  . Depression 12/28/2012  . Marijuana use 12/28/2012    Past Surgical History:  Procedure Laterality Date  . HERNIA REPAIR      OB History    Gravida  2   Para  2   Term  2   Preterm  0   AB  0   Living  2     SAB  0   TAB  0   Ectopic  0   Multiple  0   Live Births  2            Home Medications    Prior to Admission medications   Medication Sig Start Date End Date Taking? Authorizing Provider  albuterol (PROVENTIL HFA;VENTOLIN HFA) 108 (90 Base) MCG/ACT inhaler Inhale 1-2 puffs into the lungs every 6 (six) hours as needed for wheezing or shortness of breath. 08/03/18   Dahlia Byes A, NP  cetirizine (ZYRTEC) 10 MG tablet Take 1 tablet (10 mg total) by mouth daily. 08/03/18   Bast, Gloris Manchester A, NP  fluticasone  (FLONASE) 50 MCG/ACT nasal spray Place 1 spray into both nostrils daily. 08/03/18   Dahlia Byes A, NP  ibuprofen (ADVIL) 800 MG tablet Take 1 tablet (800 mg total) by mouth 3 (three) times daily. 09/28/18   Eustace Moore, MD  ondansetron (ZOFRAN) 4 MG tablet Take 1 tablet (4 mg total) by mouth every 6 (six) hours. 09/28/18   Eustace Moore, MD    Family History Family History  Problem Relation Age of Onset  . Hypertension Other   . Diabetes Other   . Hypertension Maternal Grandmother   . Hypertension Maternal Grandfather   . Hypertension Paternal Grandmother     Social History Social History   Tobacco Use  . Smoking status: Current Some Day Smoker    Packs/day: 0.50    Types: Cigarettes    Last attempt to quit: 11/15/2015    Years since quitting: 2.8  . Smokeless tobacco: Never Used  Substance Use Topics  . Alcohol use: No    Comment: socially  . Drug use: No     Allergies   Patient has no known allergies.   Review of Systems Review  of Systems  Constitutional: Negative for chills and fever.  HENT: Negative for ear pain and sore throat.   Eyes: Positive for photophobia. Negative for pain and visual disturbance.  Respiratory: Negative for cough and shortness of breath.   Cardiovascular: Negative for chest pain and palpitations.  Gastrointestinal: Positive for nausea. Negative for abdominal pain and vomiting.  Genitourinary: Negative for dysuria and hematuria.  Musculoskeletal: Negative for arthralgias and back pain.  Skin: Negative for color change and rash.  Neurological: Positive for headaches. Negative for seizures and syncope.  All other systems reviewed and are negative.    Physical Exam Triage Vital Signs ED Triage Vitals  Enc Vitals Group     BP 09/28/18 0909 121/76     Pulse Rate 09/28/18 0909 78     Resp 09/28/18 0909 18     Temp 09/28/18 0909 98.5 F (36.9 C)     Temp Source 09/28/18 0909 Oral     SpO2 09/28/18 0909 93 %     Weight --       Height --      Head Circumference --      Peak Flow --      Pain Score 09/28/18 0912 8     Pain Loc --      Pain Edu? --      Excl. in GC? --    No data found.  Updated Vital Signs BP 121/76 (BP Location: Left Arm)   Pulse 78   Temp 98.5 F (36.9 C) (Oral)   Resp 18   SpO2 93%         Physical Exam Constitutional:      General: She is not in acute distress.    Appearance: She is well-developed.  HENT:     Head: Normocephalic and atraumatic.     Right Ear: Tympanic membrane normal.     Left Ear: Tympanic membrane and ear canal normal.     Nose: Nose normal. No congestion.     Mouth/Throat:     Mouth: Mucous membranes are moist.     Pharynx: No posterior oropharyngeal erythema.  Eyes:     Conjunctiva/sclera: Conjunctivae normal.     Pupils: Pupils are equal, round, and reactive to light.  Neck:     Musculoskeletal: Normal range of motion.  Cardiovascular:     Rate and Rhythm: Normal rate.  Pulmonary:     Effort: Pulmonary effort is normal. No respiratory distress.  Abdominal:     General: There is no distension.     Palpations: Abdomen is soft.  Musculoskeletal: Normal range of motion.  Skin:    General: Skin is warm and dry.  Neurological:     General: No focal deficit present.     Mental Status: She is alert.     Cranial Nerves: No cranial nerve deficit.     Coordination: Coordination normal.     Gait: Gait normal.     Deep Tendon Reflexes: Reflexes normal.      UC Treatments / Results  Labs (all labs ordered are listed, but only abnormal results are displayed) Labs Reviewed - No data to display  EKG None  Radiology No results found.  Procedures Procedures (including critical care time)  Medications Ordered in UC Medications  ketorolac (TORADOL) injection 60 mg (60 mg Intramuscular Given 09/28/18 0943)  dexamethasone (DECADRON) injection 10 mg (10 mg Intramuscular Given 09/28/18 0943)    Initial Impression / Assessment and Plan / UC Course   I have reviewed  the triage vital signs and the nursing notes.  Pertinent labs & imaging results that were available during my care of the patient were reviewed by me and considered in my medical decision making (see chart for details).     Migraine headache.  Discussed treatment at home.  Reviewed reasons for return Final Clinical Impressions(s) / UC Diagnoses   Final diagnoses:  Intractable migraine with aura without status migrainosus     Discharge Instructions     Home to rest. I have prescribed ibuprofen for you to take as needed for headache.  I have also prescribed a refill of Zofran to take medication for nausea.  If you feel worse instead of better, visual changes, vomiting, or increasing headache go to the ER   ED Prescriptions    Medication Sig Dispense Auth. Provider   ibuprofen (ADVIL) 800 MG tablet Take 1 tablet (800 mg total) by mouth 3 (three) times daily. 21 tablet Eustace MooreNelson,  Sue, MD   ondansetron (ZOFRAN) 4 MG tablet Take 1 tablet (4 mg total) by mouth every 6 (six) hours. 12 tablet Eustace MooreNelson,  Sue, MD     Controlled Substance Prescriptions [place] Controlled Substance Registry consulted? Not Applicable   Eustace MooreNelson,  Sue, MD 09/28/18 1024

## 2018-09-28 NOTE — Discharge Instructions (Addendum)
Home to rest. I have prescribed ibuprofen for you to take as needed for headache.  I have also prescribed a refill of Zofran to take medication for nausea.  If you feel worse instead of better, visual changes, vomiting, or increasing headache go to the ER

## 2018-12-25 ENCOUNTER — Other Ambulatory Visit: Payer: Self-pay

## 2018-12-25 ENCOUNTER — Telehealth: Payer: Self-pay | Admitting: *Deleted

## 2018-12-25 ENCOUNTER — Inpatient Hospital Stay (HOSPITAL_COMMUNITY)
Admission: AD | Admit: 2018-12-25 | Discharge: 2018-12-25 | Disposition: A | Discharge status: Home / Self Care | Payer: Medicaid Other | Attending: Obstetrics and Gynecology | Admitting: Obstetrics and Gynecology

## 2018-12-25 DIAGNOSIS — N939 Abnormal uterine and vaginal bleeding, unspecified: Secondary | ICD-10-CM | POA: Insufficient documentation

## 2018-12-25 LAB — POCT PREGNANCY, URINE: Preg Test, Ur: NEGATIVE

## 2018-12-25 NOTE — MAU Provider Note (Signed)
First Provider Initiated Contact with Patient 12/25/18 1135      S [name] is a 23 y.o. [phone] non-pregnant female who presents to MAU today with complaint of irregular heavy bleeding. Currently has Nexplanon and would like that removed.    O BP 136/87 (BP Location: Right Arm)   Pulse 68   Temp 98.9 F (37.2 C) (Oral)   Resp 18   Ht 5\' 6"  (1.676 m)   Wt 99.4 kg   LMP 12/01/2018 Comment: has implant, placed 2 yrs ago  SpO2 100%   BMI 35.38 kg/m  Physical Exam General: No acute distress, AAOx3 Heart: Regular rate Lungs: Normal effort Neuro: Grossly intact. Walking without difficulty Skin: No rashes/wounds on exposed areas Psych: Normal mood and affect  A Non pregnant female Medical screening exam complete Irregular/heavy vaginal bleeding  P Discharge from MAU in stable condition Patient given the option of transfer to [facility] for further evaluation or seek care in outpatient facility of choice List of options for follow-up given  Warning signs for worsening condition that would warrant emergency follow-up discussed Patient may return to MAU as needed for pregnancy related complaints  Ramez Arrona, Marin Shutter, MD 12/25/2018 11:43 AM

## 2018-12-25 NOTE — Telephone Encounter (Signed)
Pt called to office stating she is having some bleeding problems. Pt states she has had this problem x 4 days. Pt states she is saturating super plus tampon and currently has blood running down her legs.  Pt states she has also been passing clots. Pt was advised to be seen at hospital for eval. Pt ask if she could be seen at urgent care. Pt advised hospital would be best but she may go to UC if she chooses.

## 2018-12-25 NOTE — MAU Note (Signed)
She had excessive bleeding, started yesterday, when woke up this morning, it was running down her leg. Has a really big pad on, can feel it "pouring" and a whole lot of clots. Cramping, but not like a period or pelvic cramp. Feels really weak. Had not done a preg test

## 2019-01-02 ENCOUNTER — Other Ambulatory Visit: Payer: Self-pay

## 2019-01-02 ENCOUNTER — Encounter: Payer: Self-pay | Admitting: Obstetrics and Gynecology

## 2019-01-02 ENCOUNTER — Other Ambulatory Visit (HOSPITAL_COMMUNITY)
Admission: RE | Admit: 2019-01-02 | Discharge: 2019-01-02 | Disposition: A | Discharge status: Home / Self Care | Payer: Medicaid Other | Source: Ambulatory Visit | Attending: Obstetrics and Gynecology | Admitting: Obstetrics and Gynecology

## 2019-01-02 ENCOUNTER — Ambulatory Visit (INDEPENDENT_AMBULATORY_CARE_PROVIDER_SITE_OTHER): Payer: Medicaid Other | Admitting: Obstetrics and Gynecology

## 2019-01-02 DIAGNOSIS — Z0000 Encounter for general adult medical examination without abnormal findings: Secondary | ICD-10-CM | POA: Diagnosis not present

## 2019-01-02 DIAGNOSIS — Z01419 Encounter for gynecological examination (general) (routine) without abnormal findings: Secondary | ICD-10-CM | POA: Insufficient documentation

## 2019-01-02 DIAGNOSIS — Z3046 Encounter for surveillance of implantable subdermal contraceptive: Secondary | ICD-10-CM | POA: Diagnosis not present

## 2019-01-02 DIAGNOSIS — Z309 Encounter for contraceptive management, unspecified: Secondary | ICD-10-CM | POA: Insufficient documentation

## 2019-01-02 DIAGNOSIS — Z202 Contact with and (suspected) exposure to infections with a predominantly sexual mode of transmission: Secondary | ICD-10-CM | POA: Insufficient documentation

## 2019-01-02 DIAGNOSIS — Z30016 Encounter for initial prescription of transdermal patch hormonal contraceptive device: Secondary | ICD-10-CM

## 2019-01-02 MED ORDER — NORELGESTROMIN-ETH ESTRADIOL 150-35 MCG/24HR TD PTWK: 1.0000 | MEDICATED_PATCH | TRANSDERMAL | 12 refills | Status: DC

## 2019-01-02 NOTE — Progress Notes (Signed)
Lori Lam is a 23 y.o. [phone] female here for a routine annual gynecologic exam. She also desires to have her Nexplanon removed. She has had 2 episodes of heavy bleeding. She desires to switch to the patch. She otherwise has no GYN complaints   Gynecologic History Patient's last menstrual period was 12/20/2018. Contraception: Nexplanon Last Pap: 2018. Results were: normal   Obstetric History OB History  Gravida Para Term Preterm AB Living  2 2 2  0 0 2  SAB TAB Ectopic Multiple Live Births  0 0 0 0 2    # Outcome Date GA Lbr Len/2nd Weight Sex Delivery Anes PTL Lv  2 Term 09/25/16 [redacted]w[redacted]d 17:05 / 00:17 7 lb 11.3 oz (3.495 kg) M Vag-Spont EPI  LIV  1 Term 12/27/12 [redacted]w[redacted]d 15:49 / 00:37 6 lb 12.4 oz (3.073 kg) M Vag-Spont EPI  LIV    Past Medical History:  Diagnosis Date  . Anemia   . Anxiety   . Asthma   . Chlamydia contact, treated   . GERD (gastroesophageal reflux disease)     Past Surgical History:  Procedure Laterality Date  . HERNIA REPAIR      Current Outpatient Medications on File Prior to Visit  Medication Sig Dispense Refill  . carbamazepine (TEGRETOL) 200 MG tablet Take 200 mg by mouth 3 (three) times daily.    Marland Kitchen venlafaxine (EFFEXOR) 75 MG tablet Take 75 mg by mouth 2 (two) times daily.     No current facility-administered medications on file prior to visit.     No Known Allergies  Social History   Socioeconomic History  . Marital status: Single    Spouse name: Not on file  . Number of children: Not on file  . Years of education: Not on file  . Highest education level: Not on file  Occupational History  . Not on file  Social Needs  . Financial resource strain: Not on file  . Food insecurity    Worry: Not on file    Inability: Not on file  . Transportation needs    Medical: Not on file    Non-medical: Not on file  Tobacco Use  . Smoking status: Current Some Day Smoker    Packs/day: 0.25    Types: Cigarettes    Last attempt to quit:  11/15/2015    Years since quitting: 3.1  . Smokeless tobacco: Never Used  . Tobacco comment: 2 cigs/day  Substance and Sexual Activity  . Alcohol use: No    Comment: socially  . Drug use: No  . Sexual activity: Yes    Birth control/protection: Implant  Lifestyle  . Physical activity    Days per week: Not on file    Minutes per session: Not on file  . Stress: Not on file  Relationships  . Social Herbalist on phone: Not on file    Gets together: Not on file    Attends religious service: Not on file    Active member of club or organization: Not on file    Attends meetings of clubs or organizations: Not on file    Relationship status: Not on file  . Intimate partner violence    Fear of current or ex partner: Not on file    Emotionally abused: Not on file    Physically abused: Not on file    Forced sexual activity: Not on file  Other Topics Concern  . Not on file  Social History Narrative  . Not  on file    Family History  Problem Relation Age of Onset  . Hypertension Other   . Diabetes Other   . Hypertension Maternal Grandmother   . Hypertension Maternal Grandfather   . Hypertension Paternal Grandmother     The following portions of the patient's history were reviewed and updated as appropriate: allergies, current medications, past family history, past medical history, past social history, past surgical history and problem list.  Review of Systems Pertinent items noted in HPI and remainder of comprehensive ROS otherwise negative.   Objective:  BP 117/80   Pulse 75   Ht 5\' 6"  (1.676 m)   Wt 219 lb (99.3 kg)   LMP 12/20/2018 Comment: heavy with clots  BMI 35.35 kg/m  CONSTITUTIONAL: Well-developed, well-nourished female in no acute distress.  HENT:  Normocephalic, atraumatic, External right and left ear normal. Oropharynx is clear and moist EYES: Conjunctivae and EOM are normal. Pupils are equal, round, and reactive to light. No scleral icterus.  NECK:  Normal range of motion, supple, no masses.  Normal thyroid.  SKIN: Skin is warm and dry. No rash noted. Not diaphoretic. No erythema. No pallor. NEUROLGIC: Alert and oriented to person, place, and time. Normal reflexes, muscle tone coordination. No cranial nerve deficit noted. PSYCHIATRIC: Normal mood and affect. Normal behavior. Normal judgment and thought content. CARDIOVASCULAR: Normal heart rate noted, regular rhythm RESPIRATORY: Clear to auscultation bilaterally. Effort and breath sounds normal, no problems with respiration noted. BREASTS: Deferred ABDOMEN: Soft, normal bowel sounds, no distention noted.  No tenderness, rebound or guarding.  PELVIC: Deferred MUSCULOSKELETAL: Normal range of motion. No tenderness.  No cyanosis, clubbing, or edema.  2+ distal pulses.        GYNECOLOGY CLINIC PROCEDURE NOTE   Nexplanon Removal Patient identified, informed consent performed, consent signed.   Appropriate time out taken. Nexplanon site identified.  Area prepped in usual sterile fashon. One ml of 1% lidocaine was used to anesthetize the area at the distal end of the implant. A small stab incision was made right beside the implant on the distal portion.  The Nexplanon rod was grasped using hemostats and removed without difficulty.  There was minimal blood loss. There were no complications.  3 ml of 1% lidocaine was injected around the incision for post-procedure analgesia.  Steri-strips were applied over the small incision.  A pressure bandage was applied to reduce any bruising.  The patient tolerated the procedure well and was given post procedure instructions.       Assessment:  Annual gynecologic examination ( Pap smear not indicated) Removal of Nexplanon STD exposure Contraceptive management   Plan:  Contraceptive patch reviewed with pt. R/B/use and back up method reviewed with pt. Pt to start use this Sunday. STD testing per pt request, f/u per results Routine preventative  health maintenance measures emphasized. Please refer to After Visit Summary for other counseling recommendations.    Hermina StaggersMichael L Ervin, MD, FACOG Attending Obstetrician & Gynecologist [facility], [facility]

## 2019-01-02 NOTE — Patient Instructions (Signed)
Health Maintenance, Female Adopting a healthy lifestyle and getting preventive care are important in promoting health and wellness. Ask your health care provider about:  The right schedule for you to have regular tests and exams.  Things you can do on your own to prevent diseases and keep yourself healthy. What should I know about diet, weight, and exercise? Eat a healthy diet   Eat a diet that includes plenty of vegetables, fruits, low-fat dairy products, and lean protein.  Do not eat a lot of foods that are high in solid fats, added sugars, or sodium. Maintain a healthy weight Body mass index (BMI) is used to identify weight problems. It estimates body fat based on height and weight. Your health care provider can help determine your BMI and help you achieve or maintain a healthy weight. Get regular exercise Get regular exercise. This is one of the most important things you can do for your health. Most adults should:  Exercise for at least 150 minutes each week. The exercise should increase your heart rate and make you sweat (moderate-intensity exercise).  Do strengthening exercises at least twice a week. This is in addition to the moderate-intensity exercise.  Spend less time sitting. Even light physical activity can be beneficial. Watch cholesterol and blood lipids Have your blood tested for lipids and cholesterol at 25 years of age, then have this test every 5 years. Have your cholesterol levels checked more often if:  Your lipid or cholesterol levels are high.  You are older than 25 years of age.  You are at high risk for heart disease. What should I know about cancer screening? Depending on your health history and family history, you may need to have cancer screening at various ages. This may include screening for:  Breast cancer.  Cervical cancer.  Colorectal cancer.  Skin cancer.  Lung cancer. What should I know about heart disease, diabetes, and high blood  pressure? Blood pressure and heart disease  High blood pressure causes heart disease and increases the risk of stroke. This is more likely to develop in people who have high blood pressure readings, are of African descent, or are overweight.  Have your blood pressure checked: ? Every 3-5 years if you are 18-39 years of age. ? Every year if you are 40 years old or older. Diabetes Have regular diabetes screenings. This checks your fasting blood sugar level. Have the screening done:  Once every three years after age 40 if you are at a normal weight and have a low risk for diabetes.  More often and at a younger age if you are overweight or have a high risk for diabetes. What should I know about preventing infection? Hepatitis B If you have a higher risk for hepatitis B, you should be screened for this virus. Talk with your health care provider to find out if you are at risk for hepatitis B infection. Hepatitis C Testing is recommended for:  Everyone born from 1945 through 1965.  Anyone with known risk factors for hepatitis C. Sexually transmitted infections (STIs)  Get screened for STIs, including gonorrhea and chlamydia, if: ? You are sexually active and are younger than 24 years of age. ? You are older than 24 years of age and your health care provider tells you that you are at risk for this type of infection. ? Your sexual activity has changed since you were last screened, and you are at increased risk for chlamydia or gonorrhea. Ask your health care provider if   you are at risk.  Ask your health care provider about whether you are at high risk for HIV. Your health care provider may recommend a prescription medicine to help prevent HIV infection. If you choose to take medicine to prevent HIV, you should first get tested for HIV. You should then be tested every 3 months for as long as you are taking the medicine. Pregnancy  If you are about to stop having your period (premenopausal) and  you may become pregnant, seek counseling before you get pregnant.  Take 400 to 800 micrograms (mcg) of folic acid every day if you become pregnant.  Ask for birth control (contraception) if you want to prevent pregnancy. Osteoporosis and menopause Osteoporosis is a disease in which the bones lose minerals and strength with aging. This can result in bone fractures. If you are 65 years old or older, or if you are at risk for osteoporosis and fractures, ask your health care provider if you should:  Be screened for bone loss.  Take a calcium or vitamin D supplement to lower your risk of fractures.  Be given hormone replacement therapy (HRT) to treat symptoms of menopause. Follow these instructions at home: Lifestyle  Do not use any products that contain nicotine or tobacco, such as cigarettes, e-cigarettes, and chewing tobacco. If you need help quitting, ask your health care provider.  Do not use street drugs.  Do not share needles.  Ask your health care provider for help if you need support or information about quitting drugs. Alcohol use  Do not drink alcohol if: ? Your health care provider tells you not to drink. ? You are pregnant, may be pregnant, or are planning to become pregnant.  If you drink alcohol: ? Limit how much you use to 0-1 drink a day. ? Limit intake if you are breastfeeding.  Be aware of how much alcohol is in your drink. In the [place] one drink equals one 12 oz bottle of beer (355 mL), one 5 oz glass of wine (148 mL), or one 1 oz glass of hard liquor (44 mL). General instructions  Schedule regular health, dental, and eye exams.  Stay current with your vaccines.  Tell your health care provider if: ? You often feel depressed. ? You have ever been abused or do not feel safe at home. Summary  Adopting a healthy lifestyle and getting preventive care are important in promoting health and wellness.  Follow your health care provider's instructions about healthy  diet, exercising, and getting tested or screened for diseases.  Follow your health care provider's instructions on monitoring your cholesterol and blood pressure. This information is not intended to replace advice given to you by your health care provider. Make sure you discuss any questions you have with your health care provider. Document Released: 11/01/2010 Document Revised: 04/11/2018 Document Reviewed: 04/11/2018 Elsevier Patient Education  2020 Elsevier Inc.  

## 2019-01-03 LAB — HEPATITIS B SURFACE ANTIGEN: Hepatitis B Surface Ag: NEGATIVE

## 2019-01-03 LAB — HIV ANTIBODY (ROUTINE TESTING W REFLEX): HIV Screen 4th Generation wRfx: NONREACTIVE

## 2019-01-03 LAB — RPR: RPR Ser Ql: NONREACTIVE

## 2019-01-04 LAB — CERVICOVAGINAL ANCILLARY ONLY
Bacterial vaginitis: POSITIVE — AB
Candida vaginitis: NEGATIVE
Chlamydia: NEGATIVE
Neisseria Gonorrhea: NEGATIVE
Trichomonas: NEGATIVE

## 2019-01-14 ENCOUNTER — Telehealth: Payer: Self-pay | Admitting: Obstetrics and Gynecology

## 2019-01-14 MED ORDER — METRONIDAZOLE 500 MG PO TABS: 500.0000 mg | ORAL_TABLET | Freq: Two times a day (BID) | ORAL | 0 refills | Status: DC

## 2019-01-14 NOTE — Telephone Encounter (Signed)
Telephone call to patient regarding positive BV results, patient not in, left detailed message.  Rx of Flagyl routed to pharmacy per Dr. Rip Harbour.

## 2019-03-04 ENCOUNTER — Other Ambulatory Visit: Payer: Self-pay

## 2019-03-04 ENCOUNTER — Emergency Department (HOSPITAL_COMMUNITY)
Admission: EM | Admit: 2019-03-04 | Discharge: 2019-03-04 | Disposition: A | Discharge status: Home / Self Care | Payer: Medicaid Other | Attending: Emergency Medicine | Admitting: Emergency Medicine

## 2019-03-04 ENCOUNTER — Other Ambulatory Visit: Payer: Self-pay | Admitting: Obstetrics and Gynecology

## 2019-03-04 ENCOUNTER — Encounter (HOSPITAL_COMMUNITY): Payer: Self-pay | Admitting: *Deleted

## 2019-03-04 DIAGNOSIS — N898 Other specified noninflammatory disorders of vagina: Secondary | ICD-10-CM | POA: Insufficient documentation

## 2019-03-04 DIAGNOSIS — Z5321 Procedure and treatment not carried out due to patient leaving prior to being seen by health care provider: Secondary | ICD-10-CM | POA: Insufficient documentation

## 2019-03-04 HISTORY — DX: Other specified bacterial agents as the cause of diseases classified elsewhere: B96.89

## 2019-03-04 HISTORY — DX: Acute vaginitis: N76.0

## 2019-03-04 NOTE — ED Triage Notes (Signed)
Pt has history of Bacterial Vaginitis, today started with burning and itching,

## 2019-04-21 ENCOUNTER — Ambulatory Visit (HOSPITAL_COMMUNITY): Payer: Self-pay

## 2019-04-21 ENCOUNTER — Encounter (HOSPITAL_COMMUNITY): Payer: Self-pay

## 2019-04-21 ENCOUNTER — Ambulatory Visit (HOSPITAL_COMMUNITY)
Admission: EM | Admit: 2019-04-21 | Discharge: 2019-04-21 | Disposition: A | Discharge status: Home / Self Care | Payer: Self-pay | Attending: Family Medicine | Admitting: Family Medicine

## 2019-04-21 ENCOUNTER — Other Ambulatory Visit: Payer: Self-pay

## 2019-04-21 DIAGNOSIS — Z3201 Encounter for pregnancy test, result positive: Secondary | ICD-10-CM

## 2019-04-21 DIAGNOSIS — Z3A01 Less than 8 weeks gestation of pregnancy: Secondary | ICD-10-CM

## 2019-04-21 DIAGNOSIS — S96912A Strain of unspecified muscle and tendon at ankle and foot level, left foot, initial encounter: Secondary | ICD-10-CM

## 2019-04-21 LAB — POC URINE PREG, ED: Preg Test, Ur: POSITIVE — AB

## 2019-04-21 LAB — POCT PREGNANCY, URINE: Preg Test, Ur: POSITIVE — AB
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
## 2019-04-21 NOTE — Discharge Instructions (Addendum)
Please follow-up with [facility] [phone] tomorrow. They offer a walk-in orthopedic clinic tomorrow from 5:00 pm-7:00 pm for further evaluation of left foot.  Discontinue Naprosyn, Tregretol, Orthoevra, Venlafaxine as this is harmful to pregnancy.   Please contact the office above to establish care with PCP and OB/GYN.
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
## 2019-04-22 ENCOUNTER — Inpatient Hospital Stay (HOSPITAL_COMMUNITY)
Admission: AD | Admit: 2019-04-22 | Discharge: 2019-04-22 | Disposition: A | Discharge status: Home / Self Care | Payer: Medicaid Other | Attending: Obstetrics & Gynecology | Admitting: Obstetrics & Gynecology

## 2019-04-22 ENCOUNTER — Other Ambulatory Visit: Payer: Self-pay

## 2019-04-22 ENCOUNTER — Encounter (HOSPITAL_COMMUNITY): Payer: Self-pay | Admitting: Family Medicine

## 2019-04-22 ENCOUNTER — Inpatient Hospital Stay (HOSPITAL_COMMUNITY): Payer: Medicaid Other

## 2019-04-22 DIAGNOSIS — Z79899 Other long term (current) drug therapy: Secondary | ICD-10-CM | POA: Insufficient documentation

## 2019-04-22 DIAGNOSIS — Z3A01 Less than 8 weeks gestation of pregnancy: Secondary | ICD-10-CM | POA: Diagnosis not present

## 2019-04-22 DIAGNOSIS — O26851 Spotting complicating pregnancy, first trimester: Secondary | ICD-10-CM

## 2019-04-22 DIAGNOSIS — R109 Unspecified abdominal pain: Secondary | ICD-10-CM | POA: Diagnosis not present

## 2019-04-22 DIAGNOSIS — O26891 Other specified pregnancy related conditions, first trimester: Secondary | ICD-10-CM | POA: Insufficient documentation

## 2019-04-22 DIAGNOSIS — F17210 Nicotine dependence, cigarettes, uncomplicated: Secondary | ICD-10-CM | POA: Diagnosis not present

## 2019-04-22 DIAGNOSIS — O209 Hemorrhage in early pregnancy, unspecified: Secondary | ICD-10-CM | POA: Insufficient documentation

## 2019-04-22 DIAGNOSIS — O99331 Smoking (tobacco) complicating pregnancy, first trimester: Secondary | ICD-10-CM | POA: Insufficient documentation

## 2019-04-22 DIAGNOSIS — N939 Abnormal uterine and vaginal bleeding, unspecified: Secondary | ICD-10-CM | POA: Diagnosis present

## 2019-04-22 DIAGNOSIS — O3680X0 Pregnancy with inconclusive fetal viability, not applicable or unspecified: Secondary | ICD-10-CM | POA: Diagnosis not present

## 2019-04-22 DIAGNOSIS — T7491XA Unspecified adult maltreatment, confirmed, initial encounter: Secondary | ICD-10-CM

## 2019-04-22 LAB — CBC
HCT: 36.7 % (ref 36.0–46.0)
Hemoglobin: 12.2 g/dL (ref 12.0–15.0)
MCH: 29.8 pg (ref 26.0–34.0)
MCHC: 33.2 g/dL (ref 30.0–36.0)
MCV: 89.7 fL (ref 80.0–100.0)
Platelets: 270 10*3/uL (ref 150–400)
RBC: 4.09 MIL/uL (ref 3.87–5.11)
RDW: 12.2 % (ref 11.5–15.5)
WBC: 7.2 10*3/uL (ref 4.0–10.5)
nRBC: 0 % (ref 0.0–0.2)

## 2019-04-22 LAB — URINALYSIS, ROUTINE W REFLEX MICROSCOPIC
Bilirubin Urine: NEGATIVE
Glucose, UA: NEGATIVE mg/dL
Hgb urine dipstick: NEGATIVE
Ketones, ur: NEGATIVE mg/dL
Nitrite: NEGATIVE
Protein, ur: 30 mg/dL — AB
Specific Gravity, Urine: 1.029 (ref 1.005–1.030)
pH: 6 (ref 5.0–8.0)

## 2019-04-22 LAB — WET PREP, GENITAL
Clue Cells Wet Prep HPF POC: NONE SEEN
Sperm: NONE SEEN
Trich, Wet Prep: NONE SEEN
Yeast Wet Prep HPF POC: NONE SEEN

## 2019-04-22 LAB — HCG, QUANTITATIVE, PREGNANCY: hCG, Beta Chain, Quant, S: 182 m[IU]/mL — ABNORMAL HIGH (ref <5)

## 2019-04-22 MED ORDER — OXYCODONE-ACETAMINOPHEN 5-325 MG PO TABS
2.0000 | ORAL_TABLET | Freq: Once | ORAL | Status: AC
  Administered 2019-04-22: 2 via ORAL
  Filled 2019-04-22: qty 2
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
## 2019-04-22 NOTE — Discharge Instructions (Signed)
Human Chorionic Gonadotropin Test Why am I having this test? A human chorionic gonadotropin (hCG) test is done to determine whether you are pregnant. It can also be used:  To diagnose an abnormal pregnancy.  To determine whether you have had a failed pregnancy (miscarriage) or are at risk of one. What is being tested? This test checks the level of the human chorionic gonadotropin (hCG) hormone in the blood. This hormone is produced during pregnancy by the cells that form the placenta. The placenta is the organ that grows inside your womb (uterus) to nourish a developing baby. When you are pregnant, hCG can be detected in your blood or urine 7 to 8 days before your missed period. It continues to go up for the first 8-10 weeks of pregnancy. The presence of hCG in your blood can be measured with several different types of tests. You may have:  A urine test. ? Because this hormone is eliminated from your body by your kidneys, you may have a urine test to find out whether you are pregnant. A home pregnancy test detects whether there is hCG in your urine. ? A urine test only shows whether there is hCG in your urine. It does not measure how much.  A qualitative blood test. ? You may have this type of blood test to find out if you are pregnant. ? This blood test only shows whether there is hCG in your blood. It does not measure how much.  A quantitative blood test. ? This type of blood test measures the amount of hCG in your blood. ? You may have this test to:  Diagnose an abnormal pregnancy.  Check whether you have had a miscarriage.  Determine whether you are at risk of a miscarriage. What kind of sample is taken?     Two kinds of samples may be collected to test for the hCG hormone.  Blood. It is usually collected by inserting a needle into a blood vessel.  Urine. It is usually collected by urinating into a germ-free (sterile) specimen cup. It is best to collect the sample the first  time you urinate in the morning. How do I prepare for this test? No preparation is needed for a blood test.  For the urine test:  Let your health care provider know about: ? All medicines you are taking, including vitamins, herbs, creams, and over-the-counter medicines. ? Any blood in your urine. This may interfere with the result.  Do not drink too much fluid. Drink as you normally would, or as directed by your health care provider. How are the results reported? Depending on the type of test that you have, your test results may be reported as values. Your health care provider will compare your results to normal ranges that were established after testing a large group of people (reference ranges). Reference ranges may vary among labs and hospitals. For this test, common reference ranges that show absence of pregnancy are:  Quantitative hCG blood levels: less than 5 IU/L. Other results will be reported as either positive or negative. For this test, normal results (meaning the absence of pregnancy) are:  Negative for hCG in the urine test.  Negative for hCG in the qualitative blood test. What do the results mean? Urine and qualitative blood test  A negative result could mean: ? That you are not pregnant. ? That the test was done too early in your pregnancy to detect hCG in your blood or urine. If you still have other signs   of pregnancy, the test will be repeated.  A positive result means: ? That you are most likely pregnant. Your health care provider may confirm your pregnancy with an imaging study (ultrasound) of your uterus, if needed. Quantitative blood test Results of the quantitative hCG blood test will be interpreted as follows:  Less than 5 IU/L: You are most likely not pregnant.  Greater than 25 IU/L: You are most likely pregnant.  hCG levels that are higher than expected: ? You are pregnant with twins. ? You have abnormal growths in the uterus.  hCG levels that are  rising more slowly than expected: ? You have an ectopic pregnancy (also called a tubal pregnancy).  hCG levels that are falling: ? You may be having a miscarriage. Talk with your health care provider about what your results mean. Questions to ask your health care provider Ask your health care provider, or the department that is doing the test:  When will my results be ready?  How will I get my results?  What are my treatment options?  What other tests do I need?  What are my next steps? Summary  A human chorionic gonadotropin test is done to determine whether you are pregnant.  When you are pregnant, hCG can be detected in your blood or urine 7 to 8 days before your missed period. It continues to go up for the first 8-10 weeks of pregnancy.  Your hCG level can be measured with different types of tests. You may have a urine test, a qualitative blood test, or a quantitative blood test.  Talk with your health care provider about what your results mean. This information is not intended to replace advice given to you by your health care provider. Make sure you discuss any questions you have with your health care provider. Document Released: 05/20/2004 Document Revised: 03/20/2017 Document Reviewed: 03/20/2017 Elsevier Patient Education  2020 [employer]. Vaginal Bleeding During Pregnancy, First Trimester  A small amount of bleeding from the vagina (spotting) is relatively common during early pregnancy. It usually stops on its own. Various things may cause bleeding or spotting during early pregnancy. Some bleeding may be related to the pregnancy, and some may not. In many cases, the bleeding is normal and is not a problem. However, bleeding can also be a sign of something serious. Be sure to tell your health care provider about any vaginal bleeding right away. Some possible causes of vaginal bleeding during the first trimester include:  Infection or inflammation of the  cervix.  Growths (polyps) on the cervix.  Miscarriage or threatened miscarriage.  Pregnancy tissue developing outside of the uterus (ectopic pregnancy).  A mass of tissue developing in the uterus due to an egg being fertilized incorrectly (molar pregnancy). Follow these instructions at home: Activity  Follow instructions from your health care provider about limiting your activity. Ask what activities are safe for you.  If needed, make plans for someone to help with your regular activities.  Do not have sex or orgasms until your health care provider says that this is safe. General instructions  Take over-the-counter and prescription medicines only as told by your health care provider.  Pay attention to any changes in your symptoms.  Do not use tampons or douche.  Write down how many pads you use each day, how often you change pads, and how soaked (saturated) they are.  If you pass any tissue from your vagina, save the tissue so you can show it to your health care  provider.  Keep all follow-up visits as told by your health care provider. This is important. Contact a health care provider if:  You have vaginal bleeding during any part of your pregnancy.  You have cramps or labor pains.  You have a fever. Get help right away if:  You have severe cramps in your back or abdomen.  You pass large clots or a large amount of tissue from your vagina.  Your bleeding increases.  You feel light-headed or weak, or you faint.  You have chills.  You are leaking fluid or have a gush of fluid from your vagina. Summary  A small amount of bleeding (spotting) from the vagina is relatively common during early pregnancy.  Various things may cause bleeding or spotting in early pregnancy.  Be sure to tell your health care provider about any vaginal bleeding right away. This information is not intended to replace advice given to you by your health care provider. Make sure you discuss any  questions you have with your health care provider. Document Released: 01/26/2005 Document Revised: 08/07/2018 Document Reviewed: 07/21/2016 Elsevier Patient Education  2020 [employer].

## 2019-04-23 LAB — GC/CHLAMYDIA PROBE AMP (~~LOC~~) NOT AT ARMC
Chlamydia: NEGATIVE
Comment: NEGATIVE
Comment: NORMAL
Neisseria Gonorrhea: NEGATIVE

## 2019-04-25 ENCOUNTER — Inpatient Hospital Stay (HOSPITAL_COMMUNITY)
Admission: AD | Admit: 2019-04-25 | Discharge: 2019-04-25 | Disposition: A | Discharge status: Home / Self Care | Payer: Medicaid Other | Attending: Obstetrics and Gynecology | Admitting: Obstetrics and Gynecology

## 2019-04-25 ENCOUNTER — Other Ambulatory Visit: Payer: Self-pay

## 2019-04-25 DIAGNOSIS — O3680X0 Pregnancy with inconclusive fetal viability, not applicable or unspecified: Secondary | ICD-10-CM | POA: Insufficient documentation

## 2019-04-25 LAB — HCG, QUANTITATIVE, PREGNANCY: hCG, Beta Chain, Quant, S: 576 m[IU]/mL — ABNORMAL HIGH (ref <5)
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
## 2019-04-25 NOTE — MAU Note (Addendum)
Darrol Poke CNM in Triage to talk with pt regarding lab results and plan of care. Pt then d/c home from Triage

## 2019-05-01 ENCOUNTER — Ambulatory Visit (HOSPITAL_COMMUNITY)
Admission: EM | Admit: 2019-05-01 | Discharge: 2019-05-01 | Disposition: A | Discharge status: Home / Self Care | Payer: Medicaid Other

## 2019-05-01 ENCOUNTER — Other Ambulatory Visit: Payer: Self-pay

## 2019-05-01 DIAGNOSIS — Z4802 Encounter for removal of sutures: Secondary | ICD-10-CM | POA: Diagnosis not present

## 2019-05-01 DIAGNOSIS — S0181XA Laceration without foreign body of other part of head, initial encounter: Secondary | ICD-10-CM | POA: Diagnosis not present
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
## 2019-05-08 ENCOUNTER — Encounter: Payer: Self-pay | Admitting: Family Medicine

## 2019-05-08 ENCOUNTER — Other Ambulatory Visit: Payer: Self-pay

## 2019-05-08 ENCOUNTER — Ambulatory Visit (INDEPENDENT_AMBULATORY_CARE_PROVIDER_SITE_OTHER): Payer: Medicaid Other

## 2019-05-08 ENCOUNTER — Ambulatory Visit (HOSPITAL_COMMUNITY)
Admission: RE | Admit: 2019-05-08 | Discharge: 2019-05-08 | Disposition: A | Discharge status: Home / Self Care | Payer: Medicaid Other | Source: Ambulatory Visit | Attending: Certified Nurse Midwife | Admitting: Certified Nurse Midwife

## 2019-05-08 DIAGNOSIS — O3680X0 Pregnancy with inconclusive fetal viability, not applicable or unspecified: Secondary | ICD-10-CM | POA: Insufficient documentation

## 2019-05-08 DIAGNOSIS — Z712 Person consulting for explanation of examination or test findings: Secondary | ICD-10-CM

## 2019-05-08 NOTE — Progress Notes (Signed)
Pt here today for results following [place] appt for pregnancy of unknown anatomic location. Results and pt hx reviewed with Vonzella Nipple, PA who finds single, living IUP and recommends pt begin prenatal care.   Results, dating, and provider recommendation reviewed with pt. Medications and allergies reviewed; list of medications safe to take during pregnancy given. Encouraged pt to begin OTC prenatal vitamin and begin prenatal care. Front office notified to provide proof of pregnancy letter.   Fleet Contras RN 05/08/19

## 2019-05-08 NOTE — Progress Notes (Signed)
Patient seen and assessed by nursing staff during this encounter. I have reviewed the chart and agree with the documentation and plan.  Vonzella Nipple, PA-C 05/08/2019 4:24 PM

## 2019-06-11 ENCOUNTER — Encounter (HOSPITAL_COMMUNITY): Payer: Self-pay | Admitting: Emergency Medicine

## 2019-06-11 ENCOUNTER — Ambulatory Visit (HOSPITAL_COMMUNITY)
Admission: EM | Admit: 2019-06-11 | Discharge: 2019-06-11 | Disposition: A | Discharge status: Home / Self Care | Payer: Medicaid Other | Attending: Family Medicine | Admitting: Family Medicine

## 2019-06-11 ENCOUNTER — Other Ambulatory Visit: Payer: Self-pay

## 2019-06-11 DIAGNOSIS — Z3202 Encounter for pregnancy test, result negative: Secondary | ICD-10-CM

## 2019-06-11 DIAGNOSIS — N390 Urinary tract infection, site not specified: Secondary | ICD-10-CM

## 2019-06-11 LAB — POCT URINALYSIS DIP (DEVICE)
Bilirubin Urine: NEGATIVE
Glucose, UA: NEGATIVE mg/dL
Nitrite: NEGATIVE
Protein, ur: NEGATIVE mg/dL
Specific Gravity, Urine: 1.025 (ref 1.005–1.030)
Urobilinogen, UA: 1 mg/dL (ref 0.0–1.0)
pH: 7 (ref 5.0–8.0)

## 2019-06-11 LAB — POCT PREGNANCY, URINE: Preg Test, Ur: NEGATIVE

## 2019-06-11 LAB — POC URINE PREG, ED: Preg Test, Ur: NEGATIVE

## 2019-06-11 MED ORDER — CEPHALEXIN 500 MG PO CAPS: 500.0000 mg | ORAL_CAPSULE | Freq: Two times a day (BID) | ORAL | 0 refills | Status: AC

## 2019-06-11 MED ORDER — FLUCONAZOLE 150 MG PO TABS: 150.0000 mg | ORAL_TABLET | Freq: Once | ORAL | 0 refills | Status: AC

## 2019-06-11 NOTE — Discharge Instructions (Signed)
Urine showed evidence of infection. We are treating you with keflex twice daily for 1 week. Be sure to take full course. Stay hydrated- urine should be pale yellow to clear.   Vaginal swab pending May use diflucan if developing yeast symptoms  Please return or follow up with your primary provider if symptoms not improving with treatment. Please return sooner if you have worsening of symptoms or develop fever, nausea, vomiting, abdominal pain, back pain, lightheadedness, dizziness.

## 2019-06-11 NOTE — ED Triage Notes (Signed)
Pt sts dysuria and vaginal irritation after having elective abortion recently

## 2019-06-11 NOTE — ED Provider Notes (Signed)
[facility]    CSN: [id number] Arrival date & time: 06/11/19  1543      History   Chief Complaint Chief Complaint  Patient presents with  . Dysuria    HPI Lori Lam is a 24 y.o. female history of asthma, depression, presenting today for evaluation of dysuria and irritation after abortion.  Patient had abortion approximately 2 to 3 weeks ago.  Approximately 1 week ago she began developed discomfort with urination mainly around the urethra.  She also has noted symptoms of vaginal discharge which has been yellowish-whitish.  Denies any new partners or being sexually active since abortion.  [facility]  Reports urinary frequency and urgency, denies incomplete voiding.  Denies hematuria.  HPI  Past Medical History:  Diagnosis Date  . Anemia   . Anxiety   . Asthma   . Bacterial vaginitis   . Chlamydia contact, treated   . GERD (gastroesophageal reflux disease)     Patient Active Problem List   Diagnosis Date Noted  . Visit for routine gyn exam 01/02/2019  . STD exposure 01/02/2019  . Contraception management 01/02/2019  . Mild concussion 12/21/2017  . H/O chlamydia infection 12/28/2012  . Asthma 12/28/2012  . Depression 12/28/2012  . Marijuana use 12/28/2012    Past Surgical History:  Procedure Laterality Date  . HERNIA REPAIR      OB History    Gravida  3   Para  2   Term  2   Preterm  0   AB  0   Living  2     SAB  0   TAB  0   Ectopic  0   Multiple  0   Live Births  2            Home Medications    Prior to Admission medications   Medication Sig Start Date End Date Taking? Authorizing Provider  cephALEXin (KEFLEX) 500 MG capsule Take 1 capsule (500 mg total) by mouth 2 (two) times daily for 7 days. 06/11/19 06/18/19  Glennis Montenegro C, PA-C  fluconazole (DIFLUCAN) 150 MG tablet Take 1 tablet (150 mg total) by mouth once for 1 dose. 06/11/19 06/11/19  Kandon Hosking, Junius Creamer, PA-C    Family  History Family History  Problem Relation Age of Onset  . Hypertension Other   . Diabetes Other   . Hypertension Maternal Grandmother   . Hypertension Maternal Grandfather   . Hypertension Paternal Grandmother     Social History Social History   Tobacco Use  . Smoking status: Current Some Day Smoker    Packs/day: 0.25    Types: Cigarettes    Last attempt to quit: 11/15/2015    Years since quitting: 3.5  . Smokeless tobacco: Never Used  . Tobacco comment: 2 cigs/day  Substance Use Topics  . Alcohol use: No    Comment: socially  . Drug use: No     Allergies   Patient has no known allergies.   Review of Systems Review of Systems  Constitutional: Negative for fever.  Respiratory: Negative for shortness of breath.   Cardiovascular: Negative for chest pain.  Gastrointestinal: Negative for abdominal pain, diarrhea, nausea and vomiting.  Genitourinary: Positive for dysuria, frequency and vaginal discharge. Negative for flank pain, genital sores, hematuria, menstrual problem, vaginal bleeding and vaginal pain.  Musculoskeletal: Negative for back pain.  Skin: Negative for rash.  Neurological: Negative for dizziness, light-headedness and headaches.     Physical Exam Triage Vital  Signs ED Triage Vitals  Enc Vitals Group     BP 06/11/19 1614 (!) 150/89     Pulse Rate 06/11/19 1614 84     Resp 06/11/19 1614 18     Temp 06/11/19 1614 98.3 F (36.8 C)     Temp Source 06/11/19 1614 Oral     SpO2 06/11/19 1614 100 %     Weight --      Height --      Head Circumference --      Peak Flow --      Pain Score 06/11/19 1615 4     Pain Loc --      Pain Edu? --      Excl. in [place]? --    No data found.  Updated Vital Signs BP (!) 150/89 (BP Location: Right Arm)   Pulse 84   Temp 98.3 F (36.8 C) (Oral)   Resp 18   LMP 03/26/2019   SpO2 100%   Breastfeeding Unknown   Visual Acuity Right Eye Distance:   Left Eye Distance:   Bilateral Distance:    Right Eye Near:    Left Eye Near:    Bilateral Near:     Physical Exam Vitals and nursing note reviewed.  Constitutional:      General: She is not in acute distress.    Appearance: She is well-developed.  HENT:     Head: Normocephalic and atraumatic.  Eyes:     Conjunctiva/sclera: Conjunctivae normal.  Cardiovascular:     Rate and Rhythm: Normal rate and regular rhythm.     Heart sounds: No murmur.  Pulmonary:     Effort: Pulmonary effort is normal. No respiratory distress.     Breath sounds: Normal breath sounds.  Abdominal:     Palpations: Abdomen is soft.     Tenderness: There is no abdominal tenderness.     Comments: Soft, nondistended, tender to palpation to mid abdomen, negative rebound, negative McBurney  Genitourinary:    Comments: Normal external female genitalia, vaginal mucosa pink, cervix pink, whitish discharge present in vagina and in os Musculoskeletal:     Cervical back: Neck supple.  Skin:    General: Skin is warm and dry.  Neurological:     Mental Status: She is alert.      UC Treatments / Results  Labs (all labs ordered are listed, but only abnormal results are displayed) Labs Reviewed  POCT URINALYSIS DIP (DEVICE) - Abnormal; Notable for the following components:      Result Value   Ketones, ur TRACE (*)    Hgb urine dipstick TRACE (*)    Leukocytes,Ua SMALL (*)    All other components within normal limits  URINE CULTURE  POCT PREGNANCY, URINE  POC URINE PREG, ED  CERVICOVAGINAL ANCILLARY ONLY    EKG   Radiology No results found.  Procedures Procedures (including critical care time)  Medications Ordered in UC Medications - No data to display  Initial Impression / Assessment and Plan / UC Course  I have reviewed the triage vital signs and the nursing notes.  Pertinent labs & imaging results that were available during my care of the patient were reviewed by me and considered in my medical decision making (see chart for details).     Small leuks,  sending for urine culture.  Empirically treating for UTI today with Keflex twice daily x1 week.  Vaginal swab pending to further evaluate irritation and discharge.  Provided Diflucan to treat for yeast if  developing any itching or irritation.  Discussed strict return precautions. Patient verbalized understanding and is agreeable with plan.  Final Clinical Impressions(s) / UC Diagnoses   Final diagnoses:  Lower urinary tract infectious disease     Discharge Instructions     Urine showed evidence of infection. We are treating you with keflex twice daily for 1 week. Be sure to take full course. Stay hydrated- urine should be pale yellow to clear.   Vaginal swab pending May use diflucan if developing yeast symptoms  Please return or follow up with your primary provider if symptoms not improving with treatment. Please return sooner if you have worsening of symptoms or develop fever, nausea, vomiting, abdominal pain, back pain, lightheadedness, dizziness.   ED Prescriptions    Medication Sig Dispense Auth. Provider   cephALEXin (KEFLEX) 500 MG capsule Take 1 capsule (500 mg total) by mouth 2 (two) times daily for 7 days. 14 capsule Ashari Llewellyn C, PA-C   fluconazole (DIFLUCAN) 150 MG tablet Take 1 tablet (150 mg total) by mouth once for 1 dose. 2 tablet Lacie Landry, [place] C, PA-C     PDMP not reviewed this encounter.   Lew Dawes, [place] 06/11/19 [phone]

## 2019-06-12 LAB — URINE CULTURE: Culture: <10000 — AB

## 2019-06-14 ENCOUNTER — Encounter (HOSPITAL_COMMUNITY): Payer: Self-pay

## 2019-06-14 ENCOUNTER — Telehealth (HOSPITAL_COMMUNITY): Payer: Self-pay | Admitting: Emergency Medicine

## 2019-06-14 LAB — CERVICOVAGINAL ANCILLARY ONLY
Bacterial vaginitis: POSITIVE — AB
Candida vaginitis: NEGATIVE
Chlamydia: NEGATIVE
Neisseria Gonorrhea: NEGATIVE
Trichomonas: POSITIVE — AB

## 2019-06-14 MED ORDER — METRONIDAZOLE 500 MG PO TABS: 500.0000 mg | ORAL_TABLET | Freq: Two times a day (BID) | ORAL | 0 refills | Status: AC

## 2019-06-14 NOTE — Telephone Encounter (Signed)
Trichomonas is positive. Rx  for Flagyl  was sent to the pharmacy of record. Pt needs education to refrain from sexual intercourse for 7 days to give the medicine time to work. Sexual partners need to be notified and tested/treated. Condoms may reduce risk of reinfection. Recheck for further evaluation if symptoms are not improving.   Bacterial vaginosis is positive. Pt needs treatment. Flagyl 500 mg BID x 7 days #14 no refills sent to patients pharmacy of choice.    Attempted to reach patient. No answer at this time. Voicemail is full.   If you have any questions, you may call me at [phone]

## 2019-08-16 ENCOUNTER — Other Ambulatory Visit: Payer: Self-pay

## 2019-08-16 ENCOUNTER — Ambulatory Visit (HOSPITAL_COMMUNITY)
Admission: EM | Admit: 2019-08-16 | Discharge: 2019-08-16 | Disposition: A | Discharge status: Home / Self Care | Payer: Medicaid Other | Attending: Family Medicine | Admitting: Family Medicine

## 2019-08-16 ENCOUNTER — Encounter (HOSPITAL_COMMUNITY): Payer: Self-pay

## 2019-08-16 DIAGNOSIS — B9689 Other specified bacterial agents as the cause of diseases classified elsewhere: Secondary | ICD-10-CM

## 2019-08-16 DIAGNOSIS — N760 Acute vaginitis: Secondary | ICD-10-CM

## 2019-08-16 DIAGNOSIS — S86002D Unspecified injury of left Achilles tendon, subsequent encounter: Secondary | ICD-10-CM | POA: Diagnosis not present

## 2019-08-16 MED ORDER — METRONIDAZOLE 500 MG PO TABS: 500.0000 mg | ORAL_TABLET | Freq: Two times a day (BID) | ORAL | 0 refills | Status: DC

## 2019-08-16 NOTE — ED Provider Notes (Signed)
[facility]    CSN: [id number] Arrival date & time: 08/16/19  1504      History   Chief Complaint Chief Complaint  Patient presents with  . Leg Pain    HPI Lori Lam is a 24 y.o. female.   HPI  Patient hurt her ankle and December She was given a cam walker boot She discontinued the boot because it was too heavy She was advised to follow-up with sports medicine She states she failed to keep this appointment She is here because she continues to have pain in her ankle.  Pain when she tries to jog or play with children.  Past Medical History:  Diagnosis Date  . Anemia   . Anxiety   . Asthma   . Bacterial vaginitis   . Chlamydia contact, treated   . GERD (gastroesophageal reflux disease)     Patient Active Problem List   Diagnosis Date Noted  . Visit for routine gyn exam 01/02/2019  . STD exposure 01/02/2019  . Contraception management 01/02/2019  . Mild concussion 12/21/2017  . H/O chlamydia infection 12/28/2012  . Asthma 12/28/2012  . Depression 12/28/2012  . Marijuana use 12/28/2012    Past Surgical History:  Procedure Laterality Date  . HERNIA REPAIR      OB History    Gravida  3   Para  2   Term  2   Preterm  0   AB  0   Living  2     SAB  0   TAB  0   Ectopic  0   Multiple  0   Live Births  2            Home Medications    Prior to Admission medications   Medication Sig Start Date End Date Taking? Authorizing Provider  metroNIDAZOLE (FLAGYL) 500 MG tablet Take 1 tablet (500 mg total) by mouth 2 (two) times daily. 08/16/19   Raylene Everts, MD    Family History Family History  Problem Relation Age of Onset  . Hypertension Other   . Diabetes Other   . Hypertension Maternal Grandmother   . Hypertension Maternal Grandfather   . Hypertension Paternal Grandmother     Social History Social History   Tobacco Use  . Smoking status: Current Some Day Smoker    Packs/day: 0.25    Types: Cigarettes      Last attempt to quit: 11/15/2015    Years since quitting: 3.7  . Smokeless tobacco: Never Used  . Tobacco comment: 2 cigs/day  Substance Use Topics  . Alcohol use: No    Comment: socially  . Drug use: No     Allergies   Patient has no known allergies.   Review of Systems Review of Systems  Musculoskeletal: Positive for gait problem.     Physical Exam Triage Vital Signs ED Triage Vitals  Enc Vitals Group     BP 08/16/19 1610 127/84     Pulse Rate 08/16/19 1610 64     Resp 08/16/19 1610 16     Temp 08/16/19 1610 97.7 F (36.5 C)     Temp Source 08/16/19 1610 Oral     SpO2 08/16/19 1610 100 %     Weight 08/16/19 1609 212 lb (96.2 kg)     Height --      Head Circumference --      Peak Flow --      Pain Score 08/16/19 1609 7  Pain Loc --      Pain Edu? --      Excl. in [place]? --    No data found.  Updated Vital Signs BP 127/84 (BP Location: Right Arm)   Pulse 64   Temp 97.7 F (36.5 C) (Oral)   Resp 16   Wt 96.2 kg   LMP 08/16/2019   SpO2 100%   BMI 34.22 kg/m      Physical Exam Constitutional:      General: She is not in acute distress.    Appearance: She is well-developed.     Comments: Overweight  HENT:     Head: Normocephalic and atraumatic.     Mouth/Throat:     Comments: Mask is in place Eyes:     Conjunctiva/sclera: Conjunctivae normal.     Pupils: Pupils are equal, round, and reactive to light.  Cardiovascular:     Rate and Rhythm: Normal rate.  Pulmonary:     Effort: Pulmonary effort is normal. No respiratory distress.  Abdominal:     General: There is no distension.     Palpations: Abdomen is soft.  Musculoskeletal:        General: Normal range of motion.     Cervical back: Normal range of motion.     Comments: There is a palpable knot on the Achilles tendon just above the insertion of the heel on the left side.  Achilles tendon is intact.  Range of motion is full.  Patient has some pain when she stands on her toes.  Is able to  lift both heels off the ground equally.  Skin:    General: Skin is warm and dry.  Neurological:     General: No focal deficit present.     Mental Status: She is alert.  Psychiatric:        Mood and Affect: Mood normal.        Behavior: Behavior normal.      UC Treatments / Results  Labs (all labs ordered are listed, but only abnormal results are displayed) Labs Reviewed - No data to display  EKG   Radiology No results found.  Procedures Procedures (including critical care time)  Medications Ordered in UC Medications - No data to display  Initial Impression / Assessment and Plan / UC Course  I have reviewed the triage vital signs and the nursing notes.  Pertinent labs & imaging results that were available during my care of the patient were reviewed by me and considered in my medical decision making (see chart for details).     We will refer, again, sports medicine.  She may benefit from PT if she is having pain She mentions that she also had BV at one of her visits.  She neglected to take all of her medications.  She is having odor again.  Will refill Final Clinical Impressions(s) / UC Diagnoses   Final diagnoses:  Injury of left Achilles tendon, subsequent encounter  BV (bacterial vaginosis)     Discharge Instructions     Call [facility] for an appointment   ED Prescriptions    Medication Sig Dispense Auth. Provider   metroNIDAZOLE (FLAGYL) 500 MG tablet Take 1 tablet (500 mg total) by mouth 2 (two) times daily. 14 tablet Eustace Moore, MD     PDMP not reviewed this encounter.   Eustace Moore, MD 08/16/19 2123

## 2019-08-16 NOTE — Discharge Instructions (Signed)
Call [facility] for an appointment

## 2019-08-16 NOTE — ED Triage Notes (Signed)
Pt states she has left leg pain. She was here a month ago and given a boot. Pt states she took it off because it was to heavy.

## 2019-11-09 ENCOUNTER — Other Ambulatory Visit: Payer: Self-pay

## 2019-11-09 ENCOUNTER — Ambulatory Visit (HOSPITAL_COMMUNITY)
Admission: EM | Admit: 2019-11-09 | Discharge: 2019-11-09 | Disposition: A | Discharge status: Home / Self Care | Payer: Medicaid Other | Attending: Emergency Medicine | Admitting: Emergency Medicine

## 2019-11-09 ENCOUNTER — Encounter (HOSPITAL_COMMUNITY): Payer: Self-pay

## 2019-11-09 DIAGNOSIS — N390 Urinary tract infection, site not specified: Secondary | ICD-10-CM

## 2019-11-09 DIAGNOSIS — N898 Other specified noninflammatory disorders of vagina: Secondary | ICD-10-CM | POA: Diagnosis present

## 2019-11-09 DIAGNOSIS — R350 Frequency of micturition: Secondary | ICD-10-CM | POA: Diagnosis present

## 2019-11-09 DIAGNOSIS — Z3202 Encounter for pregnancy test, result negative: Secondary | ICD-10-CM

## 2019-11-09 LAB — POC URINE PREG, ED: Preg Test, Ur: NEGATIVE

## 2019-11-09 LAB — POCT URINALYSIS DIP (DEVICE)
Bilirubin Urine: NEGATIVE
Glucose, UA: NEGATIVE mg/dL
Hgb urine dipstick: NEGATIVE
Leukocytes,Ua: NEGATIVE
Nitrite: NEGATIVE
Protein, ur: NEGATIVE mg/dL
Specific Gravity, Urine: 1.02 (ref 1.005–1.030)
Urobilinogen, UA: 0.2 mg/dL (ref 0.0–1.0)
pH: 7 (ref 5.0–8.0)

## 2019-11-09 LAB — CBG MONITORING, ED: Glucose-Capillary: 82 mg/dL (ref 70–99)

## 2019-11-09 NOTE — Discharge Instructions (Signed)
I do not see any indication of UTI here today.  Please increase your fluid intake.  We will notify of you any positive findings or if any changes to treatment are needed. If normal or otherwise without concern to your results, we will not call you. Please log on to your MyChart to review your results if interested in so.   If symptoms worsen or do not improve in the next week to return to be seen or to follow up with PCP.

## 2019-11-09 NOTE — ED Triage Notes (Signed)
Pt presents to UC for frequent urination, burning, lower abdominal pain, low back pain. Pt endorses nausea, chills, and fever. Pt also requesting STD testing.

## 2019-11-10 LAB — URINE CULTURE: Culture: NO GROWTH

## 2019-11-10 NOTE — ED Provider Notes (Signed)
[facility]    CSN: [id number] Arrival date & time: 11/09/19  1500      History   Chief Complaint Chief Complaint  Patient presents with  . Urinary Tract Infection    HPI Lori Lam is a 24 y.o. female.   Lori Lam presents with complaints of mild low abdominal and back pain, mild nausea, burning with urination as well as frequency with urination. This started weeks ago. Over the past few days has felt some "irritation."  Has noted some vaginal discharge which is white and creamy in appearance. No fevers. No diarrhea nausea or vomiting. LMP was 7/4 but she feels it was shorter than usual, questions if pregnant, has not tested. Sexually active  With 1 partner and doesn't use condoms. History of BV as well as chlamydia.    ROS per HPI, negative if not otherwise mentioned.      Past Medical History:  Diagnosis Date  . Anemia   . Anxiety   . Asthma   . Bacterial vaginitis   . Chlamydia contact, treated   . GERD (gastroesophageal reflux disease)     Patient Active Problem List   Diagnosis Date Noted  . Visit for routine gyn exam 01/02/2019  . STD exposure 01/02/2019  . Contraception management 01/02/2019  . Mild concussion 12/21/2017  . H/O chlamydia infection 12/28/2012  . Asthma 12/28/2012  . Depression 12/28/2012  . Marijuana use 12/28/2012    Past Surgical History:  Procedure Laterality Date  . HERNIA REPAIR      OB History    Gravida  3   Para  2   Term  2   Preterm  0   AB  0   Living  2     SAB  0   TAB  0   Ectopic  0   Multiple  0   Live Births  2            Home Medications    Prior to Admission medications   Medication Sig Start Date End Date Taking? Authorizing Provider  metroNIDAZOLE (FLAGYL) 500 MG tablet Take 1 tablet (500 mg total) by mouth 2 (two) times daily. 08/16/19   Eustace Moore, MD    Family History Family History  Problem Relation Age of Onset  . Hypertension Other   .  Diabetes Other   . Hypertension Maternal Grandmother   . Hypertension Maternal Grandfather   . Hypertension Paternal Grandmother     Social History Social History   Tobacco Use  . Smoking status: Current Some Day Smoker    Packs/day: 0.25    Types: Cigarettes    Last attempt to quit: 11/15/2015    Years since quitting: 3.9  . Smokeless tobacco: Never Used  . Tobacco comment: 2 cigs/day  Vaping Use  . Vaping Use: Never used  Substance Use Topics  . Alcohol use: No    Comment: socially  . Drug use: No     Allergies   Patient has no known allergies.   Review of Systems Review of Systems   Physical Exam Triage Vital Signs ED Triage Vitals  Enc Vitals Group     BP 11/09/19 1553 118/82     Pulse Rate 11/09/19 1553 74     Resp 11/09/19 1553 16     Temp 11/09/19 1553 98.1 F (36.7 C)     Temp Source 11/09/19 1553 Oral     SpO2 11/09/19 1553 97 %  Weight --      Height --      Head Circumference --      Peak Flow --      Pain Score 11/09/19 1556 0     Pain Loc --      Pain Edu? --      Excl. in [place]? --    No data found.  Updated Vital Signs BP 118/82 (BP Location: Right Arm)   Pulse 74   Temp 98.1 F (36.7 C) (Oral)   Resp 16   LMP 11/03/2019 (Exact Date)   SpO2 97%   Visual Acuity Right Eye Distance:   Left Eye Distance:   Bilateral Distance:    Right Eye Near:   Left Eye Near:    Bilateral Near:     Physical Exam Constitutional:      General: She is not in acute distress.    Appearance: She is well-developed.  Cardiovascular:     Rate and Rhythm: Normal rate and regular rhythm.  Pulmonary:     Effort: Pulmonary effort is normal.     Breath sounds: Normal breath sounds.  Abdominal:     Palpations: Abdomen is soft. Abdomen is not rigid.     Tenderness: There is no abdominal tenderness. There is no right CVA tenderness, left CVA tenderness, guarding or rebound.  Genitourinary:    Comments: Denies sores, lesions, vaginal bleeding; no pelvic  pain; gu exam deferred at this time, vaginal self swab collected.   Skin:    General: Skin is warm and dry.  Neurological:     Mental Status: She is alert and oriented to person, place, and time.      UC Treatments / Results  Labs (all labs ordered are listed, but only abnormal results are displayed) Labs Reviewed  POCT URINALYSIS DIP (DEVICE) - Abnormal; Notable for the following components:      Result Value   Ketones, ur TRACE (*)    All other components within normal limits  URINE CULTURE  POC URINE PREG, ED  CBG MONITORING, ED  CERVICOVAGINAL ANCILLARY ONLY    EKG   Radiology No results found.  Procedures Procedures (including critical care time)  Medications Ordered in UC Medications - No data to display  Initial Impression / Assessment and Plan / UC Course  I have reviewed the triage vital signs and the nursing notes.  Pertinent labs & imaging results that were available during my care of the patient were reviewed by me and considered in my medical decision making (see chart for details).     Non toxic. Benign physical exam.  Urine without acute findings, ketones present, blood sugar normal and no sugar in urine. Negative pregnancy. Vaginal cytology collected and pending. Return precautions provided. Patient verbalized understanding and agreeable to plan.   Final Clinical Impressions(s) / UC Diagnoses   Final diagnoses:  Urinary frequency  Vaginal discharge     Discharge Instructions     I do not see any indication of UTI here today.  Please increase your fluid intake.  We will notify of you any positive findings or if any changes to treatment are needed. If normal or otherwise without concern to your results, we will not call you. Please log on to your MyChart to review your results if interested in so.   If symptoms worsen or do not improve in the next week to return to be seen or to follow up with PCP.      ED Prescriptions  None     PDMP  not reviewed this encounter.   Georgetta Haber, NP 11/10/19 1348

## 2019-11-11 LAB — CERVICOVAGINAL ANCILLARY ONLY
Bacterial Vaginitis (gardnerella): POSITIVE — AB
Candida Glabrata: NEGATIVE
Candida Vaginitis: NEGATIVE
Chlamydia: NEGATIVE
Comment: NEGATIVE
Comment: NEGATIVE
Comment: NEGATIVE
Comment: NEGATIVE
Comment: NEGATIVE
Comment: NORMAL
Neisseria Gonorrhea: NEGATIVE
Trichomonas: NEGATIVE

## 2019-11-14 ENCOUNTER — Telehealth (HOSPITAL_COMMUNITY): Payer: Self-pay | Admitting: Family Medicine

## 2019-11-14 MED ORDER — METRONIDAZOLE 500 MG PO TABS: 500.0000 mg | ORAL_TABLET | Freq: Two times a day (BID) | ORAL | 0 refills | Status: DC

## 2019-11-14 NOTE — Telephone Encounter (Signed)
Pt wanting treatment for BV Sending in flagyl

## 2019-12-07 ENCOUNTER — Ambulatory Visit (HOSPITAL_COMMUNITY)
Admission: EM | Admit: 2019-12-07 | Discharge: 2019-12-07 | Disposition: A | Discharge status: Home / Self Care | Payer: Medicaid Other | Attending: Emergency Medicine | Admitting: Emergency Medicine

## 2019-12-07 ENCOUNTER — Other Ambulatory Visit: Payer: Self-pay

## 2019-12-07 DIAGNOSIS — J0100 Acute maxillary sinusitis, unspecified: Secondary | ICD-10-CM | POA: Insufficient documentation

## 2019-12-07 DIAGNOSIS — Z20822 Contact with and (suspected) exposure to covid-19: Secondary | ICD-10-CM | POA: Diagnosis not present

## 2019-12-07 DIAGNOSIS — F17210 Nicotine dependence, cigarettes, uncomplicated: Secondary | ICD-10-CM | POA: Diagnosis not present

## 2019-12-07 DIAGNOSIS — Z79899 Other long term (current) drug therapy: Secondary | ICD-10-CM | POA: Insufficient documentation

## 2019-12-07 DIAGNOSIS — R5383 Other fatigue: Secondary | ICD-10-CM | POA: Insufficient documentation

## 2019-12-07 DIAGNOSIS — J029 Acute pharyngitis, unspecified: Secondary | ICD-10-CM | POA: Insufficient documentation

## 2019-12-07 DIAGNOSIS — R519 Headache, unspecified: Secondary | ICD-10-CM | POA: Diagnosis not present

## 2019-12-07 DIAGNOSIS — R0982 Postnasal drip: Secondary | ICD-10-CM | POA: Insufficient documentation

## 2019-12-07 LAB — SARS CORONAVIRUS 2 (TAT 6-24 HRS): SARS Coronavirus 2: NEGATIVE

## 2019-12-07 MED ORDER — FLUTICASONE PROPIONATE 50 MCG/ACT NA SUSP: 1.0000 | Freq: Every day | NASAL | 0 refills | Status: DC

## 2019-12-07 MED ORDER — AMOXICILLIN-POT CLAVULANATE 875-125 MG PO TABS: 1.0000 | ORAL_TABLET | Freq: Two times a day (BID) | ORAL | 0 refills | Status: AC

## 2019-12-07 NOTE — Discharge Instructions (Signed)
Push fluids to ensure adequate hydration and keep secretions thin.  Tylenol and/or ibuprofen as needed for pain or fevers.  Complete course of antibiotics.  Use of daily flonase, regular use would be more effective.  If symptoms worsen or do not improve in the next week to return to be seen or to follow up with PCP.   Self isolate until covid results are back and negative.  Will notify you by phone of any positive findings. Your negative results will be sent through your MyChart.

## 2019-12-07 NOTE — ED Triage Notes (Signed)
Pt presents to UC for nasal congestion, post nasal drip, ear pressure, facial pressure, fatigue, cough. Pt states symptom onset began approx 2 weeks ago but has gotten worse in the past 4 days. Pt denies OTC treatmetns or relieving factors. Pt agreeable to covid testing at this time. Pt denies fevers, chills, body aches, n/v/d.

## 2019-12-07 NOTE — ED Provider Notes (Signed)
[facility]    CSN: [id number] Arrival date & time: 12/07/19  1056      History   Chief Complaint Chief Complaint  Patient presents with  . Nasal Congestion    HPI Lori Lam is a 24 y.o. female.   Lori Lam presents with complaints of sinus symptoms. Facial pressure, fatigue, nasal congestion, ear pressure. Post nasal drip, causing some sore throat. Has been taking allergy and sinus medications which only moderately help. Symptoms started two weeks ago. No fevers. No gi symptoms. No known ill contacts. No history of covid-19 and has not received vaccination. No previous sinus infections but states has issues with nasal congestion.    ROS per HPI, negative if not otherwise mentioned.      Past Medical History:  Diagnosis Date  . Anemia   . Anxiety   . Asthma   . Bacterial vaginitis   . Chlamydia contact, treated   . GERD (gastroesophageal reflux disease)     Patient Active Problem List   Diagnosis Date Noted  . Visit for routine gyn exam 01/02/2019  . STD exposure 01/02/2019  . Contraception management 01/02/2019  . Mild concussion 12/21/2017  . H/O chlamydia infection 12/28/2012  . Asthma 12/28/2012  . Depression 12/28/2012  . Marijuana use 12/28/2012    Past Surgical History:  Procedure Laterality Date  . HERNIA REPAIR      OB History    Gravida  3   Para  2   Term  2   Preterm  0   AB  0   Living  2     SAB  0   TAB  0   Ectopic  0   Multiple  0   Live Births  2            Home Medications    Prior to Admission medications   Medication Sig Start Date End Date Taking? Authorizing Provider  amoxicillin-clavulanate (AUGMENTIN) 875-125 MG tablet Take 1 tablet by mouth every 12 (twelve) hours for 10 days. 12/07/19 12/17/19  Georgetta Haber, NP  fluticasone (FLONASE) 50 MCG/ACT nasal spray Place 1 spray into both nostrils daily. 12/07/19   Georgetta Haber, NP  metroNIDAZOLE (FLAGYL) 500 MG tablet Take 1  tablet (500 mg total) by mouth 2 (two) times daily. 11/14/19   Janace Aris, NP    Family History Family History  Problem Relation Age of Onset  . Hypertension Other   . Diabetes Other   . Hypertension Maternal Grandmother   . Hypertension Maternal Grandfather   . Hypertension Paternal Grandmother     Social History Social History   Tobacco Use  . Smoking status: Current Some Day Smoker    Packs/day: 0.25    Types: Cigarettes    Last attempt to quit: 11/15/2015    Years since quitting: 4.0  . Smokeless tobacco: Never Used  . Tobacco comment: 2 cigs/day  Vaping Use  . Vaping Use: Never used  Substance Use Topics  . Alcohol use: No    Comment: socially  . Drug use: No     Allergies   Patient has no known allergies.   Review of Systems Review of Systems   Physical Exam Triage Vital Signs ED Triage Vitals  Enc Vitals Group     BP 12/07/19 1158 135/89     Pulse Rate 12/07/19 1158 72     Resp 12/07/19 1158 16     Temp 12/07/19 1158 98.1 F (36.7  C)     Temp Source 12/07/19 1158 Oral     SpO2 12/07/19 1158 100 %     Weight --      Height --      Head Circumference --      Peak Flow --      Pain Score 12/07/19 1156 4     Pain Loc --      Pain Edu? --      Excl. in GC? --    No data found.  Updated Vital Signs BP 135/89 (BP Location: Right Arm)   Pulse 72   Temp 98.1 F (36.7 C) (Oral)   Resp 16   LMP 03/22/2019 (Approximate)   SpO2 100%   Visual Acuity Right Eye Distance:   Left Eye Distance:   Bilateral Distance:    Right Eye Near:   Left Eye Near:    Bilateral Near:     Physical Exam Constitutional:      General: She is not in acute distress.    Appearance: She is well-developed.  HENT:     Right Ear: Tympanic membrane and ear canal normal.     Left Ear: Tympanic membrane and ear canal normal.     Nose: Mucosal edema and rhinorrhea present. Rhinorrhea is clear.     Right Sinus: Maxillary sinus tenderness present.     Left Sinus:  Maxillary sinus tenderness present.     Mouth/Throat:     Pharynx: Oropharynx is clear. No posterior oropharyngeal erythema.  Cardiovascular:     Rate and Rhythm: Normal rate.  Pulmonary:     Effort: Pulmonary effort is normal.  Skin:    General: Skin is warm and dry.  Neurological:     Mental Status: She is alert and oriented to person, place, and time.      UC Treatments / Results  Labs (all labs ordered are listed, but only abnormal results are displayed) Labs Reviewed  SARS CORONAVIRUS 2 (TAT 6-24 HRS)    EKG   Radiology No results found.  Procedures Procedures (including critical care time)  Medications Ordered in UC Medications - No data to display  Initial Impression / Assessment and Plan / UC Course  I have reviewed the triage vital signs and the nursing notes.  Pertinent labs & imaging results that were available during my care of the patient were reviewed by me and considered in my medical decision making (see chart for details).     Two weeks of sinus symptoms, without improvement. Augmentin provided, covid testing pending as well. Return precautions provided. Patient verbalized understanding and agreeable to plan.   Final Clinical Impressions(s) / UC Diagnoses   Final diagnoses:  Acute non-recurrent maxillary sinusitis     Discharge Instructions     Push fluids to ensure adequate hydration and keep secretions thin.  Tylenol and/or ibuprofen as needed for pain or fevers.  Complete course of antibiotics.  Use of daily flonase, regular use would be more effective.  If symptoms worsen or do not improve in the next week to return to be seen or to follow up with PCP.   Self isolate until covid results are back and negative.  Will notify you by phone of any positive findings. Your negative results will be sent through your MyChart.        ED Prescriptions    Medication Sig Dispense Auth. Provider   amoxicillin-clavulanate (AUGMENTIN) 875-125 MG  tablet Take 1 tablet by mouth every 12 (twelve) hours for 10  days. 20 tablet Linus Mako B, NP   fluticasone (FLONASE) 50 MCG/ACT nasal spray Place 1 spray into both nostrils daily. 16 g Georgetta Haber, NP     PDMP not reviewed this encounter.   Georgetta Haber, NP 12/08/19 [id number]

## 2020-05-22 ENCOUNTER — Ambulatory Visit
Admission: EM | Admit: 2020-05-22 | Discharge: 2020-05-22 | Disposition: A | Discharge status: Home / Self Care | Payer: Medicaid Other | Attending: Emergency Medicine | Admitting: Emergency Medicine

## 2020-05-22 ENCOUNTER — Other Ambulatory Visit: Payer: Self-pay

## 2020-05-22 ENCOUNTER — Ambulatory Visit (INDEPENDENT_AMBULATORY_CARE_PROVIDER_SITE_OTHER): Payer: Medicaid Other

## 2020-05-22 DIAGNOSIS — R059 Cough, unspecified: Secondary | ICD-10-CM | POA: Diagnosis not present

## 2020-05-22 DIAGNOSIS — J45901 Unspecified asthma with (acute) exacerbation: Secondary | ICD-10-CM

## 2020-05-22 DIAGNOSIS — J4521 Mild intermittent asthma with (acute) exacerbation: Secondary | ICD-10-CM | POA: Diagnosis not present

## 2020-05-22 DIAGNOSIS — N898 Other specified noninflammatory disorders of vagina: Secondary | ICD-10-CM | POA: Diagnosis not present

## 2020-05-22 DIAGNOSIS — R0989 Other specified symptoms and signs involving the circulatory and respiratory systems: Secondary | ICD-10-CM | POA: Diagnosis not present

## 2020-05-22 LAB — POCT URINALYSIS DIP (MANUAL ENTRY)
Bilirubin, UA: NEGATIVE
Blood, UA: NEGATIVE
Glucose, UA: NEGATIVE mg/dL
Ketones, POC UA: NEGATIVE mg/dL
Nitrite, UA: NEGATIVE
Protein Ur, POC: NEGATIVE mg/dL
Spec Grav, UA: 1.02 (ref 1.010–1.025)
Urobilinogen, UA: 0.2 E.U./dL
pH, UA: 7.5 (ref 5.0–8.0)

## 2020-05-22 LAB — POCT URINE PREGNANCY: Preg Test, Ur: NEGATIVE

## 2020-05-22 MED ORDER — PREDNISONE 50 MG PO TABS: 50.0000 mg | ORAL_TABLET | Freq: Every day | ORAL | 0 refills | Status: DC

## 2020-05-22 MED ORDER — BENZONATATE 200 MG PO CAPS: 200.0000 mg | ORAL_CAPSULE | Freq: Three times a day (TID) | ORAL | 0 refills | Status: AC | PRN

## 2020-05-22 MED ORDER — ALBUTEROL SULFATE HFA 108 (90 BASE) MCG/ACT IN AERS
2.0000 | INHALATION_SPRAY | Freq: Once | RESPIRATORY_TRACT | Status: AC
  Administered 2020-05-22: 2 via RESPIRATORY_TRACT

## 2020-05-22 MED ORDER — METRONIDAZOLE 500 MG PO TABS: 500.0000 mg | ORAL_TABLET | Freq: Two times a day (BID) | ORAL | 0 refills | Status: AC

## 2020-05-22 NOTE — ED Triage Notes (Signed)
Patient states she has been having asthma exacerbations intermittently over the last 4-5 days and would like a inhaler. Pt is aox4 and ambulatory.

## 2020-05-22 NOTE — ED Provider Notes (Signed)
[name]    CSN: [id number] Arrival date & time: 05/22/20  1430      History   Chief Complaint Chief Complaint  Patient presents with  . Asthma  . Vaginal Discharge    HPI Lori Lam is a 25 y.o. female history of asthma presenting today for evaluation of shortness of breath.  Reports over the past 2 weeks has had more frequent asthma exacerbations and reports increased shortness of breath and wheezing.  Has not had asthma flares since she was younger, currently does not have inhaler.  She also reports associated sinus congestion drainage and cough.  Denies fevers chills or body aches.  Also reports vaginal discharge and itching over the past 2 weeks as well.  Reports history of yeast infections.  Symptoms feel more like BV.  HPI  Past Medical History:  Diagnosis Date  . Anemia   . Anxiety   . Asthma   . Bacterial vaginitis   . Chlamydia contact, treated   . GERD (gastroesophageal reflux disease)     Patient Active Problem List   Diagnosis Date Noted  . Visit for routine gyn exam 01/02/2019  . STD exposure 01/02/2019  . Contraception management 01/02/2019  . Mild concussion 12/21/2017  . H/O chlamydia infection 12/28/2012  . Asthma 12/28/2012  . Depression 12/28/2012  . Marijuana use 12/28/2012    Past Surgical History:  Procedure Laterality Date  . HERNIA REPAIR      OB History    Gravida  3   Para  2   Term  2   Preterm  0   AB  0   Living  2     SAB  0   IAB  0   Ectopic  0   Multiple  0   Live Births  2            Home Medications    Prior to Admission medications   Medication Sig Start Date End Date Taking? Authorizing Provider  benzonatate (TESSALON) 200 MG capsule Take 1 capsule (200 mg total) by mouth 3 (three) times daily as needed for up to 7 days for cough. 05/22/20 05/29/20 Yes Sheanna Dail C, PA-C  metroNIDAZOLE (FLAGYL) 500 MG tablet Take 1 tablet (500 mg total) by mouth 2 (two) times daily  for 7 days. 05/22/20 05/29/20 Yes Caedyn Tassinari C, PA-C  predniSONE (DELTASONE) 50 MG tablet Take 1 tablet (50 mg total) by mouth daily with breakfast. 05/22/20  Yes Firman Petrow C, PA-C  fluticasone (FLONASE) 50 MCG/ACT nasal spray Place 1 spray into both nostrils daily. 12/07/19 05/22/20  Georgetta Haber, NP    Family History Family History  Problem Relation Age of Onset  . Hypertension Other   . Diabetes Other   . Hypertension Maternal Grandmother   . Hypertension Maternal Grandfather   . Hypertension Paternal Grandmother     Social History Social History   Tobacco Use  . Smoking status: Current Some Day Smoker    Packs/day: 0.25    Types: Cigarettes    Last attempt to quit: 11/15/2015    Years since quitting: 4.5  . Smokeless tobacco: Never Used  . Tobacco comment: 2 cigs/day  Vaping Use  . Vaping Use: Never used  Substance Use Topics  . Alcohol use: No    Comment: socially  . Drug use: No     Allergies   Patient has no known allergies.   Review of Systems Review of Systems  Constitutional: Negative  for activity change, appetite change, chills, fatigue and fever.  HENT: Positive for congestion. Negative for ear pain, rhinorrhea, sinus pressure, sore throat and trouble swallowing.   Eyes: Negative for discharge and redness.  Respiratory: Positive for cough, shortness of breath and wheezing. Negative for chest tightness.   Cardiovascular: Negative for chest pain.  Gastrointestinal: Negative for abdominal pain, diarrhea, nausea and vomiting.  Musculoskeletal: Negative for myalgias.  Skin: Negative for rash.  Neurological: Negative for dizziness, light-headedness and headaches.     Physical Exam Triage Vital Signs ED Triage Vitals  Enc Vitals Group     BP      Pulse      Resp      Temp      Temp src      SpO2      Weight      Height      Head Circumference      Peak Flow      Pain Score      Pain Loc      Pain Edu?      Excl. in GC?    No data  found.  Updated Vital Signs BP (!) 150/77 (BP Location: Left Arm)   Pulse 63   Temp 98 F (36.7 C) (Oral)   Resp 18   LMP 05/05/2020 (Approximate)   SpO2 98%   Breastfeeding No   Visual Acuity Right Eye Distance:   Left Eye Distance:   Bilateral Distance:    Right Eye Near:   Left Eye Near:    Bilateral Near:     Physical Exam Vitals and nursing note reviewed.  Constitutional:      Appearance: She is well-developed and well-nourished.     Comments: No acute distress  HENT:     Head: Normocephalic and atraumatic.     Ears:     Comments: Bilateral ears without tenderness to palpation of external auricle, tragus and mastoid, EAC's without erythema or swelling, TM's with good bony landmarks and cone of light. Non erythematous.     Nose: Nose normal.     Mouth/Throat:     Comments: Oral mucosa pink and moist, no tonsillar enlargement or exudate. Posterior pharynx patent and nonerythematous, no uvula deviation or swelling. Normal phonation. Eyes:     Conjunctiva/sclera: Conjunctivae normal.  Cardiovascular:     Rate and Rhythm: Normal rate.  Pulmonary:     Effort: Pulmonary effort is normal. No respiratory distress.     Comments: Breathing comfortably at rest, inspiratory and expiratory wheezing throughout bilateral lung fields   Abdominal:     General: There is no distension.  Musculoskeletal:        General: Normal range of motion.     Cervical back: Neck supple.  Skin:    General: Skin is warm and dry.  Neurological:     Mental Status: She is alert and oriented to person, place, and time.  Psychiatric:        Mood and Affect: Mood and affect normal.      UC Treatments / Results  Labs (all labs ordered are listed, but only abnormal results are displayed) Labs Reviewed  POCT URINALYSIS DIP (MANUAL ENTRY) - Abnormal; Notable for the following components:      Result Value   Leukocytes, UA Trace (*)    All other components within normal limits  POCT URINE  PREGNANCY  CERVICOVAGINAL ANCILLARY ONLY    EKG   Radiology No results found.  Procedures Procedures (including critical  care time)  Medications Ordered in UC Medications  albuterol (VENTOLIN HFA) 108 (90 Base) MCG/ACT inhaler 2 puff (2 puffs Inhalation Given 05/22/20 1539)    Initial Impression / Assessment and Plan / UC Course  I have reviewed the triage vital signs and the nursing notes.  Pertinent labs & imaging results that were available during my care of the patient were reviewed by me and considered in my medical decision making (see chart for details).     Chest x-ray normal.  No signs of pneumonia.  Treating for asthma exacerbation, albuterol provided in clinic today with improvement of wheezing, continue inhaler and added prednisone, Tessalon for cough.  Vaginitis-empirically treating for BV given history.  Swab pending.  Urine unremarkable.  Discussed strict return precautions. Patient verbalized understanding and is agreeable with plan.  Final Clinical Impressions(s) / UC Diagnoses   Final diagnoses:  Vaginal irritation  Mild intermittent asthma with acute exacerbation     Discharge Instructions     Albuterol inhaler as needed Prednisone daily for 5 days Tessalon for cough Flagyl for BV Follow up if not improving or worsening    ED Prescriptions    Medication Sig Dispense Auth. Provider   metroNIDAZOLE (FLAGYL) 500 MG tablet Take 1 tablet (500 mg total) by mouth 2 (two) times daily for 7 days. 14 tablet Omah Dewalt C, PA-C   predniSONE (DELTASONE) 50 MG tablet Take 1 tablet (50 mg total) by mouth daily with breakfast. 5 tablet Jomel Whittlesey C, PA-C   benzonatate (TESSALON) 200 MG capsule Take 1 capsule (200 mg total) by mouth 3 (three) times daily as needed for up to 7 days for cough. 28 capsule Krishon Adkison, Rebecca C, PA-C     PDMP not reviewed this encounter.   Lew Dawes, [place] 05/22/20 [phone]

## 2020-05-22 NOTE — ED Triage Notes (Signed)
Pt also states she has a hx of yeast infections and feels as though she is having some irritation and vaginal discharge that started 12 days ago.

## 2020-05-22 NOTE — Discharge Instructions (Addendum)
Albuterol inhaler as needed Prednisone daily for 5 days Tessalon for cough Flagyl for BV Follow up if not improving or worsening

## 2020-05-25 LAB — CERVICOVAGINAL ANCILLARY ONLY
Bacterial Vaginitis (gardnerella): POSITIVE — AB
Candida Glabrata: NEGATIVE
Candida Vaginitis: NEGATIVE
Chlamydia: NEGATIVE
Comment: NEGATIVE
Comment: NEGATIVE
Comment: NEGATIVE
Comment: NEGATIVE
Comment: NEGATIVE
Comment: NORMAL
Neisseria Gonorrhea: NEGATIVE
Trichomonas: NEGATIVE

## 2020-06-24 ENCOUNTER — Telehealth: Payer: Medicaid Other | Admitting: Family

## 2020-06-24 DIAGNOSIS — J4541 Moderate persistent asthma with (acute) exacerbation: Secondary | ICD-10-CM | POA: Diagnosis not present

## 2020-06-24 MED ORDER — PREDNISONE 20 MG PO TABS: 40.0000 mg | ORAL_TABLET | Freq: Every day | ORAL | 0 refills | Status: AC

## 2020-06-24 MED ORDER — ALBUTEROL SULFATE HFA 108 (90 BASE) MCG/ACT IN AERS: 2.0000 | INHALATION_SPRAY | Freq: Four times a day (QID) | RESPIRATORY_TRACT | 0 refills | Status: DC | PRN

## 2020-06-24 NOTE — Progress Notes (Signed)
Visit for Asthma  Based on what you have shared with me, it looks like you may have a flare up of your asthma.  Asthma is a chronic (ongoing) lung disease which results in airway obstruction, inflammation and hyper-responsiveness.   Asthma symptoms vary from person to person, with common symptoms including nighttime awakening and decreased ability to participate in normal activities as a result of shortness of breath. It is often triggered by changes in weather, changes in the season, changes in air temperature, or inside (home, school, daycare or work) allergens such as animal dander, mold, mildew, woodstoves or cockroaches.   It can also be triggered by hormonal changes, extreme emotion, physical exertion or an upper respiratory tract illness.     It is important to identify the trigger, and then eliminate or avoid the trigger if possible.   If you have been prescribed medications to be taken on a regular basis, it is important to follow the asthma action plan and to follow guidelines to adjust medication in response to increasing symptoms of decreased peak expiratory flow rate  Treatment: I have prescribed: Albuterol (Proventil HFA; Ventolin HFA) 108 (90 Base) MCG/ACT Inhaler 2 puffs into the lungs every six hours as needed for wheezing or shortness of breath and Prednisone 40mg by mouth per day for  7 days  HOME CARE . Only take medications as instructed by your medical team. . Consider wearing a mask or scarf to improve breathing air temperature have been shown to decrease irritation and decrease exacerbations . Get rest. . Taking a steamy shower or using a humidifier may help nasal congestion sand ease sore throat pain. You can place a towel over your head and breathe in the steam from hot water coming from a faucet. . Using a saline nasal spray works much the same way.  . Cough  drops, hare candies and sore throat lozenges may ease your cough.  . Avoid close contacts especially the very you and the elderly . Cover your mouth if you cough or sneeze . Always remember to wash your hands.    GET HELP RIGHT AWAY IF: . You develop worsening symptoms; breathlessness at rest, drowsy, confused or agitated, unable to speak in full sentences . You have coughing fits . You develop a severe headache or visual changes . You develop shortness of breath, difficulty breathing or start having chest pain . Your symptoms persist after you have completed your treatment plan . If your symptoms do not improve within 10 days  MAKE SURE YOU . Understand these instructions. . Will watch your condition. . Will get help right away if you are not doing well or get worse.   Your e-visit answers were reviewed by a board certified advanced clinical practitioner to complete your personal care plan, Depending upon the condition, your plan could have included both over the counter or prescription medications.  Please review your pharmacy choice. Your safety is important to us. If you have drug allergies check your prescription carefully. You can use MyChart to ask questions about today's visit, request a non-urgent call back, or ask for a work or school excuse for 24 hours related to this e-Visit. If it has been greater than 24 hours you will need to follow up with your provider, or enter a new e-Visit to address those concerns.  You will get an e-mail in the next two days asking about your experience. I hope that your e-visit has been valuable and will speed your recovery. Thank   you for using e-visits.   Approximately 5 minutes was spent documenting and reviewing patient's chart.   

## 2020-07-06 ENCOUNTER — Other Ambulatory Visit (INDEPENDENT_AMBULATORY_CARE_PROVIDER_SITE_OTHER): Payer: Self-pay

## 2020-07-06 ENCOUNTER — Telehealth: Payer: Medicaid Other | Admitting: Family

## 2020-07-06 DIAGNOSIS — J4541 Moderate persistent asthma with (acute) exacerbation: Secondary | ICD-10-CM

## 2020-07-06 DIAGNOSIS — J45901 Unspecified asthma with (acute) exacerbation: Secondary | ICD-10-CM

## 2020-07-06 NOTE — Progress Notes (Signed)
Based on what you shared with me, I feel your condition warrants further evaluation and I recommend that you be seen for a face to face office visit.   Given you were just treated for asthma a few weeks ago, you need to be seen in person to be examined.    NOTE: If you entered your credit card information for this eVisit, you will not be charged. You may see a "hold" on your card for the $35 but that hold will drop off and you will not have a charge processed.   If you are having a true medical emergency please call 911.      For an urgent face to face visit, [place] has five urgent care centers for your convenience:     [facility] at [facility] Directions [phone] [address] [place], [place] [address] . 10 am - 6pm Monday - Friday    [facility]) Get Driving Directions [phone] [address] [place], [place] [address] . 10 am to 8 pm Monday-Friday . 12 pm to 8 pm [facility] Urgent Care at [facility] Get Driving Directions [phone] [address] [place], [place] [address] . 8 am to 8 pm Monday-Friday . 9 am to 6 pm Saturday . 11 am to 6 pm Sunday     [facility] at [facility] Get Driving Directions  [phone] [address] [place], [place] [address] . 8 am to 8 pm Monday-Friday . 8 am to 4 pm [facility] at [facility] Directions [phone] [address] [place], [place] [address] . 12 pm to 6 pm Monday-Friday      Your e-visit answers were reviewed by a board certified advanced clinical practitioner to complete your personal care plan.  Thank you for using e-Visits.

## 2020-07-07 ENCOUNTER — Encounter (HOSPITAL_COMMUNITY): Payer: Self-pay | Admitting: Emergency Medicine

## 2020-07-07 ENCOUNTER — Ambulatory Visit (HOSPITAL_COMMUNITY)
Admission: EM | Admit: 2020-07-07 | Discharge: 2020-07-07 | Disposition: A | Discharge status: Home / Self Care | Payer: Medicaid Other | Attending: Student | Admitting: Student

## 2020-07-07 ENCOUNTER — Other Ambulatory Visit: Payer: Self-pay

## 2020-07-07 DIAGNOSIS — J4541 Moderate persistent asthma with (acute) exacerbation: Secondary | ICD-10-CM | POA: Diagnosis not present

## 2020-07-07 DIAGNOSIS — J301 Allergic rhinitis due to pollen: Secondary | ICD-10-CM

## 2020-07-07 MED ORDER — QVAR REDIHALER 80 MCG/ACT IN AERB: 2.0000 | INHALATION_SPRAY | Freq: Two times a day (BID) | RESPIRATORY_TRACT | 1 refills | Status: AC

## 2020-07-07 MED ORDER — ALBUTEROL SULFATE HFA 108 (90 BASE) MCG/ACT IN AERS
1.0000 | INHALATION_SPRAY | Freq: Four times a day (QID) | RESPIRATORY_TRACT | Status: DC | PRN
  Administered 2020-07-07: 2 via RESPIRATORY_TRACT

## 2020-07-07 MED ORDER — CETIRIZINE HCL 10 MG PO TABS: 10.0000 mg | ORAL_TABLET | Freq: Every day | ORAL | 0 refills | Status: DC

## 2020-07-07 MED ORDER — ALBUTEROL SULFATE HFA 108 (90 BASE) MCG/ACT IN AERS
INHALATION_SPRAY | RESPIRATORY_TRACT | Status: AC
  Filled 2020-07-07: qty 6.7

## 2020-07-07 NOTE — ED Triage Notes (Signed)
Patient c/o difficulty breathing at night x 1 month.   Patient states " I've been having a lot of difficulty breathing at night, sometimes during the day but not that much".   Patient endorses that these episodes occur when laying down or patient awakens from sleep "gasping for air".   Patient states she was seen at another urgent care and given an inhaler.  Patient endorses a history of asthma.   Patient states the inhaler helps when used.

## 2020-07-07 NOTE — ED Provider Notes (Signed)
[facility]    CSN: [id number] Arrival date & time: 07/07/20  1717      History   Chief Complaint Chief Complaint  Patient presents with  . Shortness of Breath    HPI Lori Lam is a 25 y.o. female presenting with uncontrolled asthma. History anemia, anxiety, asthma, BV, chlamydia, GERD. Patient endorses history of asthma, with exacerbation for 1.5 months. Endorses shortness of breath that is worse at night, dry cough. Has been prescribed multiple albuterol inhalers and is using these up in about 2 weeks due to using it 8x daily. States she's here today because her [facility] provider refused to to prescribe another albuterol. States she often feels like she's "gasping for air" particularly at night. Endorses congestion related to allergic rhintis component- taking no medications for this. Denies URI symptoms. Denies fevers/chills, n/v/d, chest pain, facial pain, teeth pain, headaches, sore throat, loss of taste/smell, swollen lymph nodes, ear pain.    HPI  Past Medical History:  Diagnosis Date  . Anemia   . Anxiety   . Asthma   . Bacterial vaginitis   . Chlamydia contact, treated   . GERD (gastroesophageal reflux disease)     Patient Active Problem List   Diagnosis Date Noted  . Visit for routine gyn exam 01/02/2019  . STD exposure 01/02/2019  . Contraception management 01/02/2019  . Mild concussion 12/21/2017  . H/O chlamydia infection 12/28/2012  . Asthma 12/28/2012  . Depression 12/28/2012  . Marijuana use 12/28/2012    Past Surgical History:  Procedure Laterality Date  . HERNIA REPAIR      OB History    Gravida  3   Para  2   Term  2   Preterm  0   AB  0   Living  2     SAB  0   IAB  0   Ectopic  0   Multiple  0   Live Births  2            Home Medications    Prior to Admission medications   Medication Sig Start Date End Date Taking? Authorizing Provider  beclomethasone (QVAR REDIHALER) 80 MCG/ACT inhaler Inhale 2  puffs into the lungs 2 (two) times daily. 07/07/20 10/05/20 Yes Rhys Martini, PA-C  cetirizine (ZYRTEC ALLERGY) 10 MG tablet Take 1 tablet (10 mg total) by mouth daily. 07/07/20 10/05/20 Yes Rhys Martini, PA-C  albuterol (VENTOLIN HFA) 108 (90 Base) MCG/ACT inhaler Inhale 2 puffs into the lungs every 6 (six) hours as needed for wheezing or shortness of breath. 06/24/20 07/07/20 Yes Hawks, Christy A, FNP  fluticasone (FLONASE) 50 MCG/ACT nasal spray Place 1 spray into both nostrils daily. 12/07/19 05/22/20  Georgetta Haber, NP    Family History Family History  Problem Relation Age of Onset  . Hypertension Other   . Diabetes Other   . Hypertension Maternal Grandmother   . Hypertension Maternal Grandfather   . Hypertension Paternal Grandmother     Social History Social History   Tobacco Use  . Smoking status: Current Some Day Smoker    Packs/day: 0.25    Types: Cigarettes    Last attempt to quit: 11/15/2015    Years since quitting: 4.6  . Smokeless tobacco: Never Used  . Tobacco comment: 2 cigs/day  Vaping Use  . Vaping Use: Never used  Substance Use Topics  . Alcohol use: No    Comment: socially  . Drug use: No  Allergies   Patient has no known allergies.   Review of Systems Review of Systems  Constitutional: Negative for appetite change, chills and fever.  HENT: Positive for congestion. Negative for ear pain, rhinorrhea, sinus pressure, sinus pain and sore throat.   Eyes: Negative for redness and visual disturbance.  Respiratory: Positive for cough and shortness of breath. Negative for chest tightness and wheezing.   Cardiovascular: Negative for chest pain and palpitations.  Gastrointestinal: Negative for abdominal pain, constipation, diarrhea, nausea and vomiting.  Genitourinary: Negative for dysuria, frequency and urgency.  Musculoskeletal: Negative for myalgias.  Neurological: Negative for dizziness, weakness and headaches.  Psychiatric/Behavioral: Negative for  confusion.  All other systems reviewed and are negative.    Physical Exam Triage Vital Signs ED Triage Vitals  Enc Vitals Group     BP 07/07/20 1734 132/82     Pulse Rate 07/07/20 1734 87     Resp 07/07/20 1734 18     Temp 07/07/20 1734 98.6 F (37 C)     Temp Source 07/07/20 1734 Oral     SpO2 07/07/20 1734 98 %     Weight --      Height --      Head Circumference --      Peak Flow --      Pain Score 07/07/20 1732 0     Pain Loc --      Pain Edu? --      Excl. in GC? --    No data found.  Updated Vital Signs BP 132/82 (BP Location: Right Arm)   Pulse 87   Temp 98.6 F (37 C) (Oral)   Resp 18   LMP 06/28/2020   SpO2 98%   Visual Acuity Right Eye Distance:   Left Eye Distance:   Bilateral Distance:    Right Eye Near:   Left Eye Near:    Bilateral Near:     Physical Exam Vitals reviewed.  Constitutional:      General: She is not in acute distress.    Appearance: Normal appearance. She is not ill-appearing.  HENT:     Head: Normocephalic and atraumatic.     Right Ear: Hearing, tympanic membrane, ear canal and external ear normal. No swelling or tenderness. There is no impacted cerumen. No mastoid tenderness. Tympanic membrane is not perforated, erythematous, retracted or bulging.     Left Ear: Hearing, tympanic membrane, ear canal and external ear normal. No swelling or tenderness. There is no impacted cerumen. No mastoid tenderness. Tympanic membrane is not perforated, erythematous, retracted or bulging.     Nose:     Right Sinus: No maxillary sinus tenderness or frontal sinus tenderness.     Left Sinus: No maxillary sinus tenderness or frontal sinus tenderness.     Mouth/Throat:     Mouth: Mucous membranes are moist.     Pharynx: Uvula midline. No oropharyngeal exudate or posterior oropharyngeal erythema.     Tonsils: No tonsillar exudate.  Cardiovascular:     Rate and Rhythm: Normal rate and regular rhythm.     Heart sounds: Normal heart sounds.   Pulmonary:     Breath sounds: Normal air entry. Decreased breath sounds and wheezing present. No rhonchi or rales.     Comments: Decreased breath sounds and wheezes throughout Chest:     Chest wall: No tenderness.  Abdominal:     General: Abdomen is flat. Bowel sounds are normal.     Tenderness: There is no abdominal tenderness. There is no  guarding or rebound.  Lymphadenopathy:     Cervical: No cervical adenopathy.  Neurological:     General: No focal deficit present.     Mental Status: She is alert and oriented to person, place, and time.  Psychiatric:        Attention and Perception: Attention and perception normal.        Mood and Affect: Mood and affect normal.        Behavior: Behavior normal. Behavior is cooperative.        Thought Content: Thought content normal.        Judgment: Judgment normal.      UC Treatments / Results  Labs (all labs ordered are listed, but only abnormal results are displayed) Labs Reviewed - No data to display  EKG   Radiology No results found.  Procedures Procedures (including critical care time)  Medications Ordered in UC Medications  albuterol (VENTOLIN HFA) 108 (90 Base) MCG/ACT inhaler 1-2 puff (2 puffs Inhalation Given 07/07/20 1803)    Initial Impression / Assessment and Plan / UC Course  I have reviewed the triage vital signs and the nursing notes.  Pertinent labs & imaging results that were available during my care of the patient were reviewed by me and considered in my medical decision making (see chart for details).      This patient is a 25 year old female presenting with uncontrolled asthma. Today she is  afebrile nontachycardic nontachypneic, oxygenating well on room air.   Plan to treat with qvar and prednisone as below. She is not a diabetic.  Another albuterol inhaler provided today but advised that this is for emergency use only. If she is using this more than 2-3x weekly she should f/u with [place] or PCP for  medication management.  Denies URI symptoms, covid test deferred.  PCP referral placed.   This chart was dictated using voice recognition software, Dragon. Despite the best efforts of this provider to proofread and correct errors, errors may still occur which can change documentation meaning.  Final Clinical Impressions(s) / UC Diagnoses   Final diagnoses:  Moderate persistent asthma with exacerbation  Seasonal allergic rhinitis due to pollen     Discharge Instructions     -Start the new inhaler- Qvar (beclomethasone). This is a steroid inhaler that should help improve the long-term control of your asthma. Use 1-2 puffs 2x daily. Make sure to swish and spit water afterwards.  -Also take Prednisone 20mg  by mouth for 5 days at home. I didn't send a prescription of this since you have it at home already. This can give you energy so try to take in the morning. -We've provided you with another albuterol inhaler today. Use this as needed for shortness of breath.  -I also sent a prescription for Zyrtec which should help with your seasonal allergies. Take one pill daily. Try this for at least a week; continue it if it helps. This shouldn't make you drowsy.  -Seek additional medical attention if you develop worsening of shortness of breath despite treatment; chest pain; fevers/chills; etc.    ED Prescriptions    Medication Sig Dispense Auth. Provider   beclomethasone (QVAR REDIHALER) 80 MCG/ACT inhaler Inhale 2 puffs into the lungs 2 (two) times daily. 1 each , PA-C   cetirizine (ZYRTEC ALLERGY) 10 MG tablet Take 1 tablet (10 mg total) by mouth daily. 90 tablet Rhys Martini, PA-C     PDMP not reviewed this encounter.   Rhys Martini, PA-C 07/07/20  1821  

## 2020-07-07 NOTE — Discharge Instructions (Addendum)
-  Start the new inhaler- Qvar (beclomethasone). This is a steroid inhaler that should help improve the long-term control of your asthma. Use 1-2 puffs 2x daily. Make sure to swish and spit water afterwards.  -Also take Prednisone 20mg  by mouth for 5 days at home. I didn't send a prescription of this since you have it at home already. This can give you energy so try to take in the morning. -We've provided you with another albuterol inhaler today. Use this as needed for shortness of breath.  -I also sent a prescription for Zyrtec which should help with your seasonal allergies. Take one pill daily. Try this for at least a week; continue it if it helps. This shouldn't make you drowsy.  -Seek additional medical attention if you develop worsening of shortness of breath despite treatment; chest pain; fevers/chills; etc.

## 2020-07-18 ENCOUNTER — Other Ambulatory Visit: Payer: Self-pay | Admitting: Family

## 2020-07-18 DIAGNOSIS — J4541 Moderate persistent asthma with (acute) exacerbation: Secondary | ICD-10-CM

## 2020-10-19 ENCOUNTER — Encounter: Payer: Self-pay | Admitting: Nurse Practitioner

## 2020-10-19 ENCOUNTER — Ambulatory Visit: Payer: Medicaid Other | Attending: Nurse Practitioner | Admitting: Nurse Practitioner

## 2020-10-19 ENCOUNTER — Other Ambulatory Visit: Payer: Self-pay

## 2020-10-19 DIAGNOSIS — J4541 Moderate persistent asthma with (acute) exacerbation: Secondary | ICD-10-CM | POA: Diagnosis not present

## 2020-10-19 MED ORDER — ALBUTEROL SULFATE HFA 108 (90 BASE) MCG/ACT IN AERS: 1.0000 | INHALATION_SPRAY | Freq: Four times a day (QID) | RESPIRATORY_TRACT | 1 refills | Status: DC | PRN

## 2020-10-19 MED ORDER — FAMOTIDINE 20 MG PO TABS: 20.0000 mg | ORAL_TABLET | Freq: Every day | ORAL | 1 refills | Status: DC

## 2020-10-19 MED ORDER — MONTELUKAST SODIUM 10 MG PO TABS: 10.0000 mg | ORAL_TABLET | Freq: Every day | ORAL | 3 refills | Status: DC

## 2020-10-19 MED ORDER — FLUTICASONE-SALMETEROL 100-50 MCG/ACT IN AEPB: 1.0000 | INHALATION_SPRAY | Freq: Two times a day (BID) | RESPIRATORY_TRACT | 3 refills | Status: DC

## 2020-10-19 MED ORDER — LORATADINE 10 MG PO TABS
10.0000 mg | ORAL_TABLET | Freq: Every day | ORAL | 1 refills | Status: DC
Start: 2020-10-19 — End: 2021-04-27

## 2020-10-19 NOTE — Progress Notes (Signed)
Trouble with asthma. Coughing up mucus.

## 2020-10-19 NOTE — Progress Notes (Signed)
Virtual Visit via Telephone Note Due to national recommendations of social distancing due to COVID 19, telehealth visit is felt to be most appropriate for this patient at this time.  I discussed the limitations, risks, security and privacy concerns of performing an evaluation and management service by telephone and the availability of in person appointments. I also discussed with the patient that there may be a patient responsible charge related to this service. The patient expressed understanding and agreed to proceed.    I connected with Lori Lam on 10/19/20  at   1:50 PM EDT  EDT by telephone and verified that I am speaking with the correct person using two identifiers.  Location of Patient: Private Residence   Location of Provider: [facility]    Persons participating in Telemedicine visit: Lori Denver FNP-BC Lori Lam    History of Present Illness: Telemedicine visit for: Establish Care She has a past medical history of Anemia, Anxiety, Asthma, Bacterial vaginitis, Chlamydia contact, treated, and GERD   Asthma Diagnosed with asthma at the age of 67. Asthma started worsening 4-5 years ago. Down from 1 pack every 2 weeks to one cigarette every few days. Symptoms of asthma: worsening cough, shortness of breath, and wheezing. States her ventolin inhaler is not working. Would like another SABA. I have prescribed her Advair, claritin, singulair, and pro air. She has has 3 urgent care/E visits for poorly controlled asthma over the past 4 months.   She has a history of GERD for which I will be prescribing Pepcid today.      Past Medical History:  Diagnosis Date   Anemia    Anxiety    Asthma    Bacterial vaginitis    Chlamydia contact, treated    GERD (gastroesophageal reflux disease)     Past Surgical History:  Procedure Laterality Date   HERNIA REPAIR      Family History  Problem Relation Age of Onset   Hypertension Other     Diabetes Other    Hypertension Maternal Grandmother    Hypertension Maternal Grandfather    Hypertension Paternal Grandmother     Social History   Socioeconomic History   Marital status: Single    Spouse name: Not on file   Number of children: Not on file   Years of education: Not on file   Highest education level: Not on file  Occupational History   Not on file  Tobacco Use   Smoking status: Some Days    Packs/day: 0.25    Pack years: 0.00    Types: Cigarettes    Last attempt to quit: 11/15/2015    Years since quitting: 4.9   Smokeless tobacco: Never   Tobacco comments:    2 cigs/day  Vaping Use   Vaping Use: Never used  Substance and Sexual Activity   Alcohol use: No    Comment: socially   Drug use: No   Sexual activity: Yes    Birth control/protection: Implant  Other Topics Concern   Not on file  Social History Narrative   Not on file   Social Determinants of Health   Financial Resource Strain: Not on file  Food Insecurity: Not on file  Transportation Needs: Not on file  Physical Activity: Not on file  Stress: Not on file  Social Connections: Not on file     Observations/Objective: Awake, alert and oriented x 3   Review of Systems  Constitutional:  Negative for fever, malaise/fatigue and weight  loss.  HENT: Negative.  Negative for nosebleeds.   Eyes: Negative.  Negative for blurred vision, double vision and photophobia.  Respiratory:  Positive for cough, sputum production, shortness of breath and wheezing.   Cardiovascular: Negative.  Negative for chest pain, palpitations and leg swelling.  Gastrointestinal:  Positive for heartburn. Negative for nausea and vomiting.  Musculoskeletal: Negative.  Negative for myalgias.  Neurological: Negative.  Negative for dizziness, focal weakness, seizures and headaches.  Psychiatric/Behavioral: Negative.  Negative for suicidal ideas.    Assessment and Plan: [name] was seen today for new patient (initial  visit).  Diagnoses and all orders for this visit:  Moderate persistent asthma with acute exacerbation -     montelukast (SINGULAIR) 10 MG tablet; Take 1 tablet (10 mg total) by mouth at bedtime. -     fluticasone-salmeterol (ADVAIR DISKUS) 100-50 MCG/ACT AEPB; Inhale 1 puff into the lungs 2 (two) times daily. -     loratadine (CLARITIN) 10 MG tablet; Take 1 tablet (10 mg total) by mouth daily. -     albuterol (PROAIR HFA) 108 (90 Base) MCG/ACT inhaler; Inhale 1-2 puffs into the lungs every 6 (six) hours as needed for wheezing or shortness of breath.    Follow Up Instructions Return for PAP SMEAR.     I discussed the assessment and treatment plan with the patient. The patient was provided an opportunity to ask questions and all were answered. The patient agreed with the plan and demonstrated an understanding of the instructions.   The patient was advised to call back or seek an in-person evaluation if the symptoms worsen or if the condition fails to improve as anticipated.  I provided 15 minutes of non-face-to-face time during this encounter including median intraservice time, reviewing previous notes, labs, imaging, medications and explaining diagnosis and management.  Claiborne Rigg, FNP-BC

## 2020-10-21 ENCOUNTER — Telehealth: Payer: Self-pay | Admitting: Nurse Practitioner

## 2020-10-21 NOTE — Telephone Encounter (Signed)
Called patient to get scheduled with Zelda for a PAP smear. The patient did not answer and the voicemail has not been set up. If patient returns call please schedule her for a PAP smear with Zelda.

## 2020-10-21 NOTE — Telephone Encounter (Signed)
-----   Message from Claiborne Rigg, NP sent at 10/19/2020  2:23 PM EDT ----- Regarding: APPT Pls call to schedule pap smear.

## 2020-12-11 ENCOUNTER — Ambulatory Visit
Admission: EM | Admit: 2020-12-11 | Discharge: 2020-12-11 | Disposition: A | Discharge status: Home / Self Care | Payer: Medicaid Other | Attending: Internal Medicine | Admitting: Internal Medicine

## 2020-12-11 ENCOUNTER — Other Ambulatory Visit: Payer: Self-pay

## 2020-12-11 ENCOUNTER — Ambulatory Visit (INDEPENDENT_AMBULATORY_CARE_PROVIDER_SITE_OTHER): Payer: Medicaid Other

## 2020-12-11 ENCOUNTER — Encounter: Payer: Self-pay | Admitting: Emergency Medicine

## 2020-12-11 DIAGNOSIS — M25561 Pain in right knee: Secondary | ICD-10-CM | POA: Diagnosis not present

## 2020-12-11 DIAGNOSIS — M79672 Pain in left foot: Secondary | ICD-10-CM | POA: Diagnosis not present

## 2020-12-11 DIAGNOSIS — M25461 Effusion, right knee: Secondary | ICD-10-CM | POA: Diagnosis present

## 2020-12-11 NOTE — ED Provider Notes (Signed)
[name]    CSN: [id number] Arrival date & time: 12/11/20  1733      History   Chief Complaint Chief Complaint  Patient presents with   Knee Pain   Foot Pain    HPI Lori Lam is a 25 y.o. female.   Patient presents with 2-week history of right-sided knee pain and left foot pain.  Patient denies any known injury, past injury, arthritis.  Patient denies having any type of job that requires a lot of walking or lifting.  Denies any numbness or tingling to lower extremities.  Patient has taken over-the-counter ibuprofen and used ice application with no improvement in pain.   Knee Pain Foot Pain   Past Medical History:  Diagnosis Date   Anemia    Anxiety    Asthma    Bacterial vaginitis    Chlamydia contact, treated    GERD (gastroesophageal reflux disease)     Patient Active Problem List   Diagnosis Date Noted   Visit for routine gyn exam 01/02/2019   STD exposure 01/02/2019   Contraception management 01/02/2019   Mild concussion 12/21/2017   H/O chlamydia infection 12/28/2012   Asthma 12/28/2012   Depression 12/28/2012   Marijuana use 12/28/2012    Past Surgical History:  Procedure Laterality Date   HERNIA REPAIR      OB History     Gravida  3   Para  2   Term  2   Preterm  0   AB  0   Living  2      SAB  0   IAB  0   Ectopic  0   Multiple  0   Live Births  2            Home Medications    Prior to Admission medications   Medication Sig Start Date End Date Taking? Authorizing Provider  fluticasone-salmeterol (ADVAIR DISKUS) 100-50 MCG/ACT AEPB Inhale 1 puff into the lungs 2 (two) times daily. 10/19/20  Yes Claiborne Rigg, NP  loratadine (CLARITIN) 10 MG tablet Take 1 tablet (10 mg total) by mouth daily. 10/19/20 01/17/21 Yes Claiborne Rigg, NP  montelukast (SINGULAIR) 10 MG tablet Take 1 tablet (10 mg total) by mouth at bedtime. 10/19/20  Yes Claiborne Rigg, NP  albuterol (PROAIR HFA) 108 (90 Base)  MCG/ACT inhaler Inhale 1-2 puffs into the lungs every 6 (six) hours as needed for wheezing or shortness of breath. 10/19/20   Claiborne Rigg, NP  famotidine (PEPCID) 20 MG tablet Take 1 tablet (20 mg total) by mouth at bedtime. 10/19/20 11/18/20  Claiborne Rigg, NP  fluticasone (FLONASE) 50 MCG/ACT nasal spray Place 1 spray into both nostrils daily. 12/07/19 05/22/20  Georgetta Haber, NP    Family History Family History  Problem Relation Age of Onset   Hypertension Other    Diabetes Other    Hypertension Maternal Grandmother    Hypertension Maternal Grandfather    Hypertension Paternal Grandmother     Social History Social History   Tobacco Use   Smoking status: Some Days    Packs/day: 0.25    Types: Cigarettes    Last attempt to quit: 11/15/2015    Years since quitting: 5.0   Smokeless tobacco: Never   Tobacco comments:    2 cigs/day  Vaping Use   Vaping Use: Never used  Substance Use Topics   Alcohol use: No    Comment: socially   Drug use: No  Allergies   Patient has no known allergies.   Review of Systems Review of Systems Per HPI  Physical Exam Triage Vital Signs ED Triage Vitals  Enc Vitals Group     BP 12/11/20 1747 119/79     Pulse Rate 12/11/20 1747 70     Resp 12/11/20 1747 18     Temp 12/11/20 1747 98 F (36.7 C)     Temp Source 12/11/20 1747 Oral     SpO2 12/11/20 1747 97 %     Weight 12/11/20 1749 210 lb (95.3 kg)     Height 12/11/20 1749 5\' 6"  (1.676 m)     Head Circumference --      Peak Flow --      Pain Score 12/11/20 1749 8     Pain Loc --      Pain Edu? --      Excl. in GC? --    No data found.  Updated Vital Signs BP 119/79 (BP Location: Left Arm)   Pulse 70   Temp 98 F (36.7 C) (Oral)   Resp 18   Ht 5\' 6"  (1.676 m)   Wt 210 lb (95.3 kg)   LMP 12/04/2020   SpO2 97%   BMI 33.89 kg/m   Visual Acuity Right Eye Distance:   Left Eye Distance:   Bilateral Distance:    Right Eye Near:   Left Eye Near:    Bilateral  Near:     Physical Exam Constitutional:      Appearance: Normal appearance.  HENT:     Head: Normocephalic and atraumatic.  Eyes:     Extraocular Movements: Extraocular movements intact.     Conjunctiva/sclera: Conjunctivae normal.  Pulmonary:     Effort: Pulmonary effort is normal.  Musculoskeletal:     Right knee: Swelling present. No crepitus. Tenderness present. Normal alignment. Normal pulse.     Left knee: Normal.     Right foot: Normal.     Left foot: Normal range of motion and normal capillary refill. Tenderness present. No swelling, laceration or bony tenderness. Normal pulse.     Comments: No tenderness to palpation to right knee.  Patient states that she has pain at right lateral knee with extension and flexion of knee.  Tenderness to palpation along left fifth metatarsal.  Neurovascular of bilateral lower extremities intact.  Neurological:     General: No focal deficit present.     Mental Status: She is alert and oriented to person, place, and time. Mental status is at baseline.  Psychiatric:        Mood and Affect: Mood normal.        Behavior: Behavior normal.        Thought Content: Thought content normal.        Judgment: Judgment normal.     UC Treatments / Results  Labs (all labs ordered are listed, but only abnormal results are displayed) Labs Reviewed  CERVICOVAGINAL ANCILLARY ONLY    EKG   Radiology DG Knee Complete 4 Views Right  Result Date: 12/11/2020 CLINICAL DATA:  Right-sided knee pain EXAM: RIGHT KNEE - COMPLETE 4+ VIEW COMPARISON:  None. FINDINGS: No fracture or malalignment. Joint spaces are patent. Moderate knee effusion. IMPRESSION: Moderate knee effusion Electronically Signed   By: 02/03/2021 M.D.   On: 12/11/2020 18:50   DG Foot Complete Left  Result Date: 12/11/2020 CLINICAL DATA:  Pain EXAM: LEFT FOOT - COMPLETE 3+ VIEW COMPARISON:  None. FINDINGS: There is no evidence of  fracture or dislocation. There is no evidence of arthropathy  or other focal bone abnormality. Soft tissues are unremarkable. IMPRESSION: Negative. Electronically Signed   By: Jasmine Pang M.D.   On: 12/11/2020 18:51    Procedures Procedures (including critical care time)  Medications Ordered in UC Medications - No data to display  Initial Impression / Assessment and Plan / UC Course  I have reviewed the triage vital signs and the nursing notes.  Pertinent labs & imaging results that were available during my care of the patient were reviewed by me and considered in my medical decision making (see chart for details).     Right knee x-ray showing joint effusion.  Right knee brace applied by nursing staff.  Advised patient to continue over-the-counter NSAIDs as well as ice application.  Left foot x-ray was negative for any acute bony abnormality.  Suspect possible contusion and or foot sprain.  Patient to use ice application and over-the-counter NSAIDs for foot pain as well.  Patient was provided contact information to follow-up with orthopedist if pain does not improve or resolve in the next 1 to 2 weeks.Discussed strict return precautions. Patient verbalized understanding and is agreeable with plan.  Final Clinical Impressions(s) / UC Diagnoses   Final diagnoses:  Effusion of right knee  Left foot pain     Discharge Instructions      You have an effusion of the right knee.  This means that there is a fluid collection in your knee.  This is treated with a knee brace as well as taking over-the-counter ibuprofen as needed for pain and inflammation.  You may also use ice application to affected area as well as elevating extremity.  Please follow-up with provided contact information for orthopedist if pain does not improve in the knee or the foot in the next 1 to 2 weeks.     ED Prescriptions   None    PDMP not reviewed this encounter.   Lance Muss, FNP 12/11/20 2027

## 2020-12-11 NOTE — ED Triage Notes (Signed)
Possible yeast infection, requesting STD check.

## 2020-12-11 NOTE — Discharge Instructions (Addendum)
You have an effusion of the right knee.  This means that there is a fluid collection in your knee.  This is treated with a knee brace as well as taking over-the-counter ibuprofen as needed for pain and inflammation.  You may also use ice application to affected area as well as elevating extremity.  Please follow-up with provided contact information for orthopedist if pain does not improve in the knee or the foot in the next 1 to 2 weeks.

## 2020-12-11 NOTE — ED Triage Notes (Signed)
Patient c/o right knee pain x 2 weeks, no apparent injury, some swelling, taking Ibuprofen.  Left top of her foot is painful, no injury.

## 2020-12-14 LAB — CERVICOVAGINAL ANCILLARY ONLY
Bacterial Vaginitis (gardnerella): POSITIVE — AB
Candida Glabrata: NEGATIVE
Candida Vaginitis: NEGATIVE
Chlamydia: NEGATIVE
Comment: NEGATIVE
Comment: NEGATIVE
Comment: NEGATIVE
Comment: NEGATIVE
Comment: NEGATIVE
Comment: NORMAL
Neisseria Gonorrhea: NEGATIVE
Trichomonas: NEGATIVE

## 2020-12-17 ENCOUNTER — Telehealth (HOSPITAL_COMMUNITY): Payer: Self-pay | Admitting: Emergency Medicine

## 2020-12-17 MED ORDER — METRONIDAZOLE 0.75 % VA GEL: 1.0000 | Freq: Every day | VAGINAL | 0 refills | Status: AC

## 2021-04-06 ENCOUNTER — Ambulatory Visit (INDEPENDENT_AMBULATORY_CARE_PROVIDER_SITE_OTHER): Payer: Medicaid Other | Admitting: *Deleted

## 2021-04-06 ENCOUNTER — Other Ambulatory Visit: Payer: Self-pay

## 2021-04-06 VITALS — BP 121/82 | HR 59

## 2021-04-06 DIAGNOSIS — Z3201 Encounter for pregnancy test, result positive: Secondary | ICD-10-CM | POA: Diagnosis not present

## 2021-04-06 DIAGNOSIS — Z3200 Encounter for pregnancy test, result unknown: Secondary | ICD-10-CM

## 2021-04-06 DIAGNOSIS — Z3480 Encounter for supervision of other normal pregnancy, unspecified trimester: Secondary | ICD-10-CM

## 2021-04-06 LAB — POCT URINE PREGNANCY: Preg Test, Ur: POSITIVE — AB

## 2021-04-06 MED ORDER — PROMETHAZINE HCL 25 MG PO TABS: 25.0000 mg | ORAL_TABLET | Freq: Four times a day (QID) | ORAL | 1 refills | Status: DC | PRN

## 2021-04-06 MED ORDER — PREPLUS 27-1 MG PO TABS: 1.0000 | ORAL_TABLET | Freq: Every day | ORAL | 13 refills | Status: DC

## 2021-04-06 NOTE — Progress Notes (Signed)
[name] presents today for UPT. She has no unusual complaints. LMP: 02/20/21    OBJECTIVE: Appears well, in no apparent distress.  OB History     Gravida  3   Para  2   Term  2   Preterm  0   AB  0   Living  2      SAB  0   IAB  0   Ectopic  0   Multiple  0   Live Births  2          Home UPT Result: positive In-Office UPT result: positive I have reviewed the patient's medical, obstetrical, social, and family histories, and medications.   ASSESSMENT: Positive pregnancy test  PLAN Prenatal care to be completed at: [facility]. RX PNV and phenergan sent.

## 2021-04-27 ENCOUNTER — Ambulatory Visit (INDEPENDENT_AMBULATORY_CARE_PROVIDER_SITE_OTHER): Payer: Medicaid Other

## 2021-04-27 ENCOUNTER — Other Ambulatory Visit: Payer: Self-pay

## 2021-04-27 VITALS — BP 117/68 | HR 68 | Ht 66.0 in | Wt 203.5 lb

## 2021-04-27 DIAGNOSIS — Z3481 Encounter for supervision of other normal pregnancy, first trimester: Secondary | ICD-10-CM | POA: Diagnosis not present

## 2021-04-27 DIAGNOSIS — Z3A09 9 weeks gestation of pregnancy: Secondary | ICD-10-CM | POA: Diagnosis not present

## 2021-04-27 DIAGNOSIS — O3680X0 Pregnancy with inconclusive fetal viability, not applicable or unspecified: Secondary | ICD-10-CM

## 2021-04-27 DIAGNOSIS — Z3490 Encounter for supervision of normal pregnancy, unspecified, unspecified trimester: Secondary | ICD-10-CM | POA: Insufficient documentation

## 2021-04-27 MED ORDER — BLOOD PRESSURE KIT DEVI
1.0000 | 0 refills | Status: DC
Start: 2021-04-27 — End: 2021-11-10

## 2021-04-27 NOTE — Progress Notes (Signed)
New OB Intake  I connected with  Lori Lam on 04/27/21 at  3:15 PM EST by in person and verified that I am speaking with the correct person using two identifiers. Nurse is located at [facility] and pt is located at [place].  I discussed the limitations, risks, security and privacy concerns of performing an evaluation and management service by telephone and the availability of in person appointments. I also discussed with the patient that there may be a patient responsible charge related to this service. The patient expressed understanding and agreed to proceed.  I explained I am completing New OB Intake today. We discussed her EDD of 11/27/21 that is based on LMP of 02/20/21. Pt is G4/P2012. I reviewed her allergies, medications, Medical/Surgical/OB history, and appropriate screenings. I informed her of [facility] services. Based on history, this is a/an  pregnancy uncomplicated .   Patient Active Problem List   Diagnosis Date Noted   Visit for routine gyn exam 01/02/2019   STD exposure 01/02/2019   Contraception management 01/02/2019   Mild concussion 12/21/2017   H/O chlamydia infection 12/28/2012   Asthma 12/28/2012   Depression 12/28/2012   Marijuana use 12/28/2012    Concerns addressed today  Delivery Plans:  Plans to deliver at [facility].   MyChart/Babyscripts MyChart access verified. I explained pt will have some visits in office and some virtually. Babyscripts instructions given and order placed. Patient verifies receipt of registration text/e-mail. Account successfully created and app downloaded.  Blood Pressure Cuff  Blood pressure cuff ordered for patient to pick-up from [employer]. Explained after first prenatal appt pt will check weekly and document in Babyscripts.  Weight scale: Patient does have weight scale at home.   Anatomy US Explained first scheduled [place] will be around 19 weeks. Dating and viability scan performed today  Labs Discussed Avelina Laine genetic  screening with patient. Would like both Panorama and Horizon drawn at new OB visit. Routine prenatal labs needed.  Covid Vaccine Patient has not covid vaccine.    Informed patient of [facility] website  and placed link in her AVS.   Social Determinants of Health Food Insecurity: Patient denies food insecurity. WIC Referral: Patient is interested in referral to [facility]  Transportation: Patient denies transportation needs. Childcare: Discussed no children allowed at ultrasound appointments. Offered childcare services; patient declines childcare services at this time.  Send link to Pregnancy Navigators   Placed OB Box on problem list and updated  First visit review I reviewed new OB appt with pt. I explained she will have a pelvic exam, ob bloodwork with genetic screening, and PAP smear. Explained pt will be seen by Scheryl Darter at first visit; encounter routed to appropriate provider. Explained that patient will be seen by pregnancy navigator following visit with provider. [facility] information placed in AVS.   Hamilton Capri, RN 04/27/2021  3:33 PM

## 2021-05-02 NOTE — L&D Delivery Note (Addendum)
Delivery Note Lori Lam is a 26 y.o. [id number] at [redacted]w[redacted]d admitted for IOL for FGR.   GBS Status: Negative/-- (07/03 1603) Maximum Maternal Temperature: 98.6  Labor course: Initial SVE: 1.5/50/-3. Augmentation with: AROM, Pitocin, Cytotec, and IP Foley. She then progressed to complete.  ROM: 7h 11m with clear fluid  Birth: After a 1 push 2nd stage, at 2014 a viable female was delivered via spontaneous vaginal delivery (Presentation: LOA). Nuchal cord present: No.  Shoulders and body delivered in usual fashion. Infant placed directly on mom's abdomen for bonding/skin-to-skin, baby dried and stimulated. Cord clamped x 2 after 1 minute and cut by support person.  Cord blood collected.  The placenta separated spontaneously and delivered via gentle cord traction.  Pitocin infused rapidly IV per protocol.  Fundus firm with massage.  Placenta inspected and appears to be intact with a 3 VC.  Placenta/Cord with the following complications: None .   Sponge and instrument count were correct x2.  Intrapartum complications:  IUGR Anesthesia:  epidural Episiotomy: none Lacerations:  n/a and various abrasions periurethrally and perineally Suture Repair:  N/A EBL (mL): 40   Infant: APGAR (1 MIN): 8   APGAR (5 MINS): 9   APGAR (10 MINS):    Infant weight: pending  Mom to postpartum.  Baby to Couplet care / Skin to Skin. Placenta to L&D   Plans to Breastfeed Contraception:  Undecided Circumcision: N/A  Note sent to [facility]: [facility] for pp visit.  Raylene Everts, MD 11/08/2021 8:33 PM   The above was performed under my direct supervision and guidance.

## 2021-05-03 ENCOUNTER — Telehealth: Payer: Medicaid Other | Admitting: Physician Assistant

## 2021-05-03 DIAGNOSIS — R051 Acute cough: Secondary | ICD-10-CM

## 2021-05-03 DIAGNOSIS — R0602 Shortness of breath: Secondary | ICD-10-CM

## 2021-05-03 NOTE — Progress Notes (Signed)
Based on what you shared with me, I feel your condition warrants further evaluation and I recommend that you be seen in a face to face visit.  I am concerned that you are feeling short of breath. I would recommend an in person visit so that you can have your oxygen checked and have a physical exam to determine if this is related to your asthma or if you could have another cause of symptoms such as pneumonia, COVID, influenza, etc.   NOTE: There will be NO CHARGE for this eVisit   If you are having a true medical emergency please call 911.      For an urgent face to face visit, [place] has six urgent care centers for your convenience:     [facility] Health Urgent Care Center at [facility] Directions [phone] [address] [place], [place] [address]    [facility] Health Urgent Care Center [facility]) Get Driving Directions [phone] [address] [place], [place] [address]  [facility] Health Urgent Care Center [facility]) Get Driving Directions [phone] [address] [place],  [place]  [address]  [facility] Health Urgent Care at [facility] Get Driving Directions [phone] [address] [place], [place] [address]   [facility] Health Urgent Care at [facility] Get Driving Directions  [phone] [address] [place], [place] [address]   [facility] Health Urgent Care at [facility] Directions [phone] [address] [place], [place] [address]  Your MyChart E-visit questionnaire answers were reviewed by a board certified advanced clinical practitioner to complete your personal care plan based on your specific symptoms.  Thank you for using e-Visits.   Approximately 5 minutes was spent documenting and reviewing patient's chart.

## 2021-05-06 ENCOUNTER — Telehealth: Payer: Self-pay

## 2021-05-06 NOTE — Telephone Encounter (Signed)
Baby RX called to report an elevated BP for patient of 144/79. Patient reports no other sx.   I called patient to follow up. Patient states that she was moving around and raised her voice a little bit when she was taking her BP. Patient states that her repeat was 119/78.  Patient advised to continue monitoring her blood pressures at this time.

## 2021-05-18 ENCOUNTER — Other Ambulatory Visit: Payer: Self-pay

## 2021-05-18 ENCOUNTER — Ambulatory Visit (INDEPENDENT_AMBULATORY_CARE_PROVIDER_SITE_OTHER): Payer: Medicaid Other | Admitting: Obstetrics & Gynecology

## 2021-05-18 ENCOUNTER — Other Ambulatory Visit (HOSPITAL_COMMUNITY)
Admission: RE | Admit: 2021-05-18 | Discharge: 2021-05-18 | Disposition: A | Discharge status: Home / Self Care | Payer: Medicaid Other | Source: Ambulatory Visit | Attending: Obstetrics & Gynecology | Admitting: Obstetrics & Gynecology

## 2021-05-18 VITALS — BP 119/80 | HR 74 | Wt 203.0 lb

## 2021-05-18 DIAGNOSIS — Z3481 Encounter for supervision of other normal pregnancy, first trimester: Secondary | ICD-10-CM | POA: Diagnosis present

## 2021-05-18 DIAGNOSIS — J4541 Moderate persistent asthma with (acute) exacerbation: Secondary | ICD-10-CM

## 2021-05-18 DIAGNOSIS — Z3A12 12 weeks gestation of pregnancy: Secondary | ICD-10-CM

## 2021-05-18 MED ORDER — ALBUTEROL SULFATE HFA 108 (90 BASE) MCG/ACT IN AERS: 1.0000 | INHALATION_SPRAY | Freq: Four times a day (QID) | RESPIRATORY_TRACT | 1 refills | Status: DC | PRN

## 2021-05-18 NOTE — Progress Notes (Signed)
Patient presents for New OB. Patient has no concerns today.  Patient request to have rx a albuterol. Patient also complains of having a white, creamy discharge. Denies itching, but states that it has a weird smell.

## 2021-05-19 LAB — CBC/D/PLT+RPR+RH+ABO+RUBIGG...
Antibody Screen: NEGATIVE
Basophils Absolute: 0 10*3/uL (ref 0.0–0.2)
Basos: 0 %
EOS (ABSOLUTE): 0.4 10*3/uL (ref 0.0–0.4)
Eos: 4 %
HCV Ab: 0.2 s/co ratio (ref 0.0–0.9)
HIV Screen 4th Generation wRfx: NONREACTIVE
Hematocrit: 35.3 % (ref 34.0–46.6)
Hemoglobin: 12.1 g/dL (ref 11.1–15.9)
Hepatitis B Surface Ag: NEGATIVE
Immature Grans (Abs): 0 10*3/uL (ref 0.0–0.1)
Immature Granulocytes: 0 %
Lymphocytes Absolute: 1.9 10*3/uL (ref 0.7–3.1)
Lymphs: 20 %
MCH: 29.9 pg (ref 26.6–33.0)
MCHC: 34.3 g/dL (ref 31.5–35.7)
MCV: 87 fL (ref 79–97)
Monocytes Absolute: 1 10*3/uL — ABNORMAL HIGH (ref 0.1–0.9)
Monocytes: 11 %
Neutrophils Absolute: 6.1 10*3/uL (ref 1.4–7.0)
Neutrophils: 65 %
Platelets: 292 10*3/uL (ref 150–450)
RBC: 4.05 x10E6/uL (ref 3.77–5.28)
RDW: 12.7 % (ref 11.7–15.4)
RPR Ser Ql: NONREACTIVE
Rh Factor: POSITIVE
Rubella Antibodies, IGG: 5.24 index (ref >0.99)
WBC: 9.5 10*3/uL (ref 3.4–10.8)

## 2021-05-19 LAB — CYTOLOGY - PAP: Diagnosis: NEGATIVE

## 2021-05-19 LAB — CERVICOVAGINAL ANCILLARY ONLY
Bacterial Vaginitis (gardnerella): POSITIVE — AB
Candida Glabrata: NEGATIVE
Candida Vaginitis: NEGATIVE
Chlamydia: NEGATIVE
Comment: NEGATIVE
Comment: NEGATIVE
Comment: NEGATIVE
Comment: NEGATIVE
Comment: NEGATIVE
Comment: NORMAL
Neisseria Gonorrhea: NEGATIVE
Trichomonas: NEGATIVE

## 2021-05-19 LAB — HEMOGLOBIN A1C
Est. average glucose Bld gHb Est-mCnc: 100 mg/dL
Hgb A1c MFr Bld: 5.1 % (ref 4.8–5.6)

## 2021-05-19 LAB — HCV INTERPRETATION

## 2021-05-20 LAB — CULTURE, OB URINE

## 2021-05-20 LAB — URINE CULTURE, OB REFLEX: Organism ID, Bacteria: NO GROWTH

## 2021-05-20 NOTE — Progress Notes (Signed)
°  Subjective:    Lori Lam is a [id number] [redacted]w[redacted]d being seen today for her first obstetrical visit.  Her obstetrical history is significant for  low risk pregnancy . Patient does intend to breast feed. Pregnancy history fully reviewed.  Patient reports no complaints.  Vitals:   05/18/21 1524  BP: 119/80  Pulse: 74  Weight: 203 lb (92.1 kg)    HISTORY: OB History  Gravida Para Term Preterm AB Living  4 2 2  0 1 2  SAB IAB Ectopic Multiple Live Births  0 0 0 0 2    # Outcome Date GA Lbr Len/2nd Weight Sex Delivery Anes PTL Lv  4 Current           3 Term 09/25/16 [redacted]w[redacted]d 17:05 / 00:17 7 lb 11.3 oz (3.495 kg) M Vag-Spont EPI  LIV  2 Term 12/27/12 [redacted]w[redacted]d 15:49 / 00:37 6 lb 12.4 oz (3.073 kg) M Vag-Spont EPI  LIV  1 AB      TAB      Past Medical History:  Diagnosis Date   Anemia    Anxiety    Asthma    Bacterial vaginitis    Chlamydia contact, treated    GERD (gastroesophageal reflux disease)    Past Surgical History:  Procedure Laterality Date   HERNIA REPAIR     Family History  Problem Relation Age of Onset   Hypertension Mother    Hypertension Father    Hypertension Maternal Grandmother    Hypertension Maternal Grandfather    Hypertension Paternal Grandmother    Hypertension Other    Diabetes Other      Exam    Uterus:   12 week  Pelvic Exam:    Perineum: No Hemorrhoids   Vulva: normal   Vagina:  normal mucosa   pH:     Cervix: no lesions   Adnexa: normal adnexa   Bony Pelvis: average  System: Breast:  normal appearance, no masses or tenderness   Skin: normal coloration and turgor, no rashes    Neurologic: oriented, normal mood   Extremities: normal strength, tone, and muscle mass   HEENT PERRLA   Mouth/Teeth mucous membranes moist, pharynx normal without lesions   Neck supple   Cardiovascular: regular rate and rhythm   Respiratory:  appears well, vitals normal, no respiratory distress, acyanotic, normal RR, chest clear, no wheezing, crepitations,  rhonchi, normal symmetric air entry   Abdomen: soft, non-tender; bowel sounds normal; no masses,  no organomegaly   Urinary: urethral meatus normal      Assessment:    Pregnancy: [redacted]w[redacted]d Patient Active Problem List   Diagnosis Date Noted   Supervision of normal pregnancy 04/27/2021   Visit for routine gyn exam 01/02/2019   STD exposure 01/02/2019   Contraception management 01/02/2019   Mild concussion 12/21/2017   H/O chlamydia infection 12/28/2012   Asthma 12/28/2012   Depression 12/28/2012   Marijuana use 12/28/2012        Plan:     Initial labs drawn. Prenatal vitamins. Problem list reviewed and updated. Genetic Screening discussed : ordered.  Ultrasound discussed; fetal survey: ordered.  Follow up in 4 weeks. 50% of 30 min visit spent on counseling and coordination of care.  A1C    12/30/2012 05/20/2021

## 2021-05-27 DIAGNOSIS — Z3482 Encounter for supervision of other normal pregnancy, second trimester: Secondary | ICD-10-CM

## 2021-05-28 ENCOUNTER — Encounter: Payer: Self-pay | Admitting: Obstetrics & Gynecology

## 2021-05-31 ENCOUNTER — Encounter: Payer: Self-pay | Admitting: Obstetrics & Gynecology

## 2021-06-01 ENCOUNTER — Encounter: Payer: Self-pay | Admitting: Obstetrics & Gynecology

## 2021-06-14 ENCOUNTER — Ambulatory Visit: Payer: Self-pay

## 2021-06-14 ENCOUNTER — Telehealth: Payer: Self-pay

## 2021-06-14 NOTE — Telephone Encounter (Signed)
Returned call, no answer, left vm 

## 2021-06-14 NOTE — Telephone Encounter (Signed)
°  Chief Complaint: Boil on outer labia that has bee there about a week and is increasing in size. Pain going down her left leg. Pt is expecting. Symptoms: Pain.  Frequency: ongoing 1 week Pertinent Negatives: Patient denies fever Disposition: [] ED /[] Urgent Care (no appt availability in office) / [] Appointment(In office/virtual)/ []  [facility]/ [] Home Care/ [] Refused Recommended Disposition /[] [facility]/ []  Follow-up with PCP  Follow up with OB.  Pt will call Dr. office for care.   Additional Notes: Pt is pregnant - [facility] does not see pregnant pts.   Reason for Disposition  Boil > 2 inches across (> 5 cm; larger than a golf ball or ping pong ball)  Protocols used: Boil (Skin Abscess)-A-AH

## 2021-06-15 ENCOUNTER — Other Ambulatory Visit: Payer: Self-pay

## 2021-06-15 ENCOUNTER — Encounter: Payer: Self-pay | Admitting: Obstetrics and Gynecology

## 2021-06-15 ENCOUNTER — Ambulatory Visit (INDEPENDENT_AMBULATORY_CARE_PROVIDER_SITE_OTHER): Payer: Medicaid Other | Admitting: Obstetrics and Gynecology

## 2021-06-15 VITALS — BP 113/78 | HR 77 | Wt 201.3 lb

## 2021-06-15 DIAGNOSIS — Z3482 Encounter for supervision of other normal pregnancy, second trimester: Secondary | ICD-10-CM

## 2021-06-15 MED ORDER — METRONIDAZOLE 0.75 % VA GEL: 1.0000 | Freq: Every day | VAGINAL | 1 refills | Status: DC

## 2021-06-15 MED ORDER — DOXYLAMINE-PYRIDOXINE 10-10 MG PO TBEC: 2.0000 | DELAYED_RELEASE_TABLET | Freq: Every day | ORAL | 5 refills | Status: DC

## 2021-06-15 MED ORDER — CEPHALEXIN 500 MG PO CAPS: 500.0000 mg | ORAL_CAPSULE | Freq: Four times a day (QID) | ORAL | 2 refills | Status: DC

## 2021-06-15 NOTE — Progress Notes (Signed)
° °  PRENATAL VISIT NOTE  Subjective:  Lori Lam is a 26 y.o. [phone] at [redacted]w[redacted]d being seen today for ongoing prenatal care.  She is currently monitored for the following issues for this low-risk pregnancy and has H/O chlamydia infection; Asthma; Depression; Marijuana use; Mild concussion; STD exposure; and Supervision of normal pregnancy on their problem list.  Patient reports .  Contractions: Not present. Vag. Bleeding: None.   . Denies leaking of fluid.   The following portions of the patient's history were reviewed and updated as appropriate: allergies, current medications, past family history, past medical history, past social history, past surgical history and problem list.   Objective:   Vitals:   06/15/21 1057  BP: 113/78  Pulse: 77  Weight: 201 lb 4.8 oz (91.3 kg)    Fetal Status: Fetal Heart Rate (bpm): 160         General:  Alert, oriented and cooperative. Patient is in no acute distress.  Skin: Skin is warm and dry. No rash noted.   Cardiovascular: Normal heart rate noted  Respiratory: Normal respiratory effort, no problems with respiration noted  Abdomen: Soft, gravid, appropriate for gestational age.  Pain/Pressure: Absent     Pelvic: Cervical exam deferred        Extremities: Normal range of motion.     Mental Status: Normal mood and affect. Normal behavior. Normal judgment and thought content.   Assessment and Plan:  Pregnancy: [id number] at [redacted]w[redacted]d 1. Encounter for supervision of other normal pregnancy in second trimester Patient is doing with a draining boil on her mons pubis.  Advised to apply warm compresses Rx keflex provided Advised patient to shave with a fresh blade to prevent recurrence AFP today - AFP, Serum, Open Spina Bifida  Preterm labor symptoms and general obstetric precautions including but not limited to vaginal bleeding, contractions, leaking of fluid and fetal movement were reviewed in detail with the patient. Please refer to After Visit  Summary for other counseling recommendations.   Return in about 4 weeks (around 07/13/2021) for in person, ROB, Low risk.  Future Appointments  Date Time Provider Department Center  07/05/2021  1:30 PM Outpatient [facility] NURSE [facility]  07/05/2021  1:45 PM [facility] US5 [facility]    Catalina Antigua, MD

## 2021-06-15 NOTE — Progress Notes (Signed)
Pt has area of concern, ?boil, on her left groin area.

## 2021-06-16 ENCOUNTER — Other Ambulatory Visit: Payer: Self-pay

## 2021-06-16 DIAGNOSIS — O219 Vomiting of pregnancy, unspecified: Secondary | ICD-10-CM

## 2021-06-16 MED ORDER — DICLEGIS 10-10 MG PO TBEC: 2.0000 | DELAYED_RELEASE_TABLET | Freq: Every day | ORAL | 5 refills | Status: DC

## 2021-06-23 LAB — AFP, SERUM, OPEN SPINA BIFIDA
AFP MoM: 0.62
AFP Value: 30.8 ng/mL
Gest. Age on Collection Date: 19.3 weeks
Maternal Age At EDD: 27.8 yr
OSBR Risk 1 IN: 10000
Test Results:: NEGATIVE
Weight: 201 [lb_av]

## 2021-07-05 ENCOUNTER — Ambulatory Visit: Payer: Medicaid Other | Attending: Obstetrics & Gynecology

## 2021-07-05 ENCOUNTER — Encounter: Payer: Self-pay | Admitting: *Deleted

## 2021-07-05 ENCOUNTER — Other Ambulatory Visit: Payer: Self-pay | Admitting: *Deleted

## 2021-07-05 ENCOUNTER — Other Ambulatory Visit: Payer: Self-pay

## 2021-07-05 ENCOUNTER — Ambulatory Visit: Payer: Medicaid Other | Admitting: *Deleted

## 2021-07-05 VITALS — BP 111/57 | HR 69

## 2021-07-05 DIAGNOSIS — Z6833 Body mass index (BMI) 33.0-33.9, adult: Secondary | ICD-10-CM

## 2021-07-05 DIAGNOSIS — Z3481 Encounter for supervision of other normal pregnancy, first trimester: Secondary | ICD-10-CM

## 2021-07-05 DIAGNOSIS — Z3482 Encounter for supervision of other normal pregnancy, second trimester: Secondary | ICD-10-CM | POA: Insufficient documentation

## 2021-07-05 DIAGNOSIS — O99212 Obesity complicating pregnancy, second trimester: Secondary | ICD-10-CM | POA: Insufficient documentation

## 2021-07-05 DIAGNOSIS — Z3A19 19 weeks gestation of pregnancy: Secondary | ICD-10-CM | POA: Insufficient documentation

## 2021-07-05 DIAGNOSIS — Z363 Encounter for antenatal screening for malformations: Secondary | ICD-10-CM | POA: Insufficient documentation

## 2021-07-05 DIAGNOSIS — J45909 Unspecified asthma, uncomplicated: Secondary | ICD-10-CM | POA: Insufficient documentation

## 2021-07-05 DIAGNOSIS — O99512 Diseases of the respiratory system complicating pregnancy, second trimester: Secondary | ICD-10-CM | POA: Insufficient documentation

## 2021-07-05 DIAGNOSIS — Z362 Encounter for other antenatal screening follow-up: Secondary | ICD-10-CM

## 2021-07-05 DIAGNOSIS — Z3689 Encounter for other specified antenatal screening: Secondary | ICD-10-CM

## 2021-07-05 NOTE — Progress Notes (Signed)
PA submitted for Diclegis via [place] [employer]. ?

## 2021-07-14 ENCOUNTER — Telehealth: Payer: Self-pay | Admitting: *Deleted

## 2021-07-14 NOTE — Telephone Encounter (Signed)
Pt called to office stating she is having an increase in pelvic pain and pressure. Pt states baby was low in pelvis on u/s.   Pt states it is intermittent, denies leaking or bleeding, reports +FM. ?Pt made aware if the pain/pressure is significantly different than normal she should be evaluated at the hospital. Pt advised to keep f/u appt in office next week.  ?

## 2021-07-19 ENCOUNTER — Other Ambulatory Visit: Payer: Self-pay

## 2021-07-19 ENCOUNTER — Encounter: Payer: Self-pay | Admitting: Obstetrics and Gynecology

## 2021-07-19 ENCOUNTER — Ambulatory Visit (INDEPENDENT_AMBULATORY_CARE_PROVIDER_SITE_OTHER): Payer: Medicaid Other | Admitting: Obstetrics and Gynecology

## 2021-07-19 VITALS — BP 110/74 | HR 75 | Wt 210.0 lb

## 2021-07-19 DIAGNOSIS — Z3482 Encounter for supervision of other normal pregnancy, second trimester: Secondary | ICD-10-CM

## 2021-07-19 MED ORDER — PANTOPRAZOLE SODIUM 40 MG PO TBEC: 40.0000 mg | DELAYED_RELEASE_TABLET | Freq: Every day | ORAL | 4 refills | Status: DC

## 2021-07-19 MED ORDER — COMFORT FIT MATERNITY SUPP LG MISC: 0 refills | Status: DC

## 2021-07-19 NOTE — Progress Notes (Signed)
Pt states she is feeling more pressure, some on her back side.  ?Pt states she is still nauseous with Diclegis, complains of heartburn/indegestion/reflux as well.  ? ?

## 2021-07-19 NOTE — Progress Notes (Signed)
? ?  PRENATAL VISIT NOTE ? ?Subjective:  ?Lori Lam is a 26 y.o. [phone] at [redacted]w[redacted]d being seen today for ongoing prenatal care.  She is currently monitored for the following issues for this low-risk pregnancy and has H/O chlamydia infection; Asthma; Depression; Marijuana use; Mild concussion; STD exposure; and Supervision of normal pregnancy on their problem list. ? ?Patient reports backache.  Contractions: Not present. Vag. Bleeding: None.  Movement: Present. Denies leaking of fluid.  ? ?The following portions of the patient's history were reviewed and updated as appropriate: allergies, current medications, past family history, past medical history, past social history, past surgical history and problem list.  ? ?Objective:  ? ?Vitals:  ? 07/19/21 1038  ?BP: 110/74  ?Pulse: 75  ?Weight: 210 lb (95.3 kg)  ? ? ?Fetal Status: Fetal Heart Rate (bpm): 155 Fundal Height: 21 cm Movement: Present    ? ?General:  Alert, oriented and cooperative. Patient is in no acute distress.  ?Skin: Skin is warm and dry. No rash noted.   ?Cardiovascular: Normal heart rate noted  ?Respiratory: Normal respiratory effort, no problems with respiration noted  ?Abdomen: Soft, gravid, appropriate for gestational age.  Pain/Pressure: Present     ?Pelvic: Cervical exam deferred        ?Extremities: Normal range of motion.     ?Mental Status: Normal mood and affect. Normal behavior. Normal judgment and thought content.  ? ?Assessment and Plan:  ?Pregnancy: [id number] at [redacted]w[redacted]d ?1. Encounter for supervision of other normal pregnancy in second trimester ?Patient is doing well ?Rx protonix provided ?Rx maternity support belt provided ? ?Preterm labor symptoms and general obstetric precautions including but not limited to vaginal bleeding, contractions, leaking of fluid and fetal movement were reviewed in detail with the patient. ?Please refer to After Visit Summary for other counseling recommendations.  ? ?Return in about 4 weeks (around 08/16/2021)  for in person, ROB, Low risk. ? ?Future Appointments  ?Date Time Provider [place]  ?08/02/2021  3:30 PM [facility] NURSE [facility]  ?08/02/2021  3:45 PM [facility] US5 [facility]  ? ? ?Mora Bellman, MD ? ?

## 2021-08-02 ENCOUNTER — Ambulatory Visit: Payer: Medicaid Other | Attending: Obstetrics and Gynecology

## 2021-08-02 ENCOUNTER — Ambulatory Visit: Payer: Medicaid Other | Admitting: *Deleted

## 2021-08-02 VITALS — BP 104/54 | HR 74

## 2021-08-02 DIAGNOSIS — Z362 Encounter for other antenatal screening follow-up: Secondary | ICD-10-CM

## 2021-08-02 DIAGNOSIS — O99512 Diseases of the respiratory system complicating pregnancy, second trimester: Secondary | ICD-10-CM | POA: Diagnosis not present

## 2021-08-02 DIAGNOSIS — Z3A23 23 weeks gestation of pregnancy: Secondary | ICD-10-CM

## 2021-08-02 DIAGNOSIS — O321XX0 Maternal care for breech presentation, not applicable or unspecified: Secondary | ICD-10-CM | POA: Insufficient documentation

## 2021-08-02 DIAGNOSIS — J45909 Unspecified asthma, uncomplicated: Secondary | ICD-10-CM | POA: Diagnosis not present

## 2021-08-02 DIAGNOSIS — Z3482 Encounter for supervision of other normal pregnancy, second trimester: Secondary | ICD-10-CM

## 2021-08-02 DIAGNOSIS — O99212 Obesity complicating pregnancy, second trimester: Secondary | ICD-10-CM | POA: Diagnosis not present

## 2021-08-02 DIAGNOSIS — Z3689 Encounter for other specified antenatal screening: Secondary | ICD-10-CM | POA: Insufficient documentation

## 2021-08-02 DIAGNOSIS — E669 Obesity, unspecified: Secondary | ICD-10-CM

## 2021-08-02 DIAGNOSIS — Z6833 Body mass index (BMI) 33.0-33.9, adult: Secondary | ICD-10-CM | POA: Insufficient documentation

## 2021-08-05 ENCOUNTER — Other Ambulatory Visit: Payer: Self-pay | Admitting: *Deleted

## 2021-08-05 DIAGNOSIS — J45909 Unspecified asthma, uncomplicated: Secondary | ICD-10-CM

## 2021-08-05 DIAGNOSIS — O99212 Obesity complicating pregnancy, second trimester: Secondary | ICD-10-CM

## 2021-08-16 ENCOUNTER — Encounter: Payer: Self-pay | Admitting: Obstetrics and Gynecology

## 2021-08-16 ENCOUNTER — Ambulatory Visit (INDEPENDENT_AMBULATORY_CARE_PROVIDER_SITE_OTHER): Payer: Medicaid Other | Admitting: Obstetrics and Gynecology

## 2021-08-16 ENCOUNTER — Other Ambulatory Visit (HOSPITAL_COMMUNITY)
Admission: RE | Admit: 2021-08-16 | Discharge: 2021-08-16 | Disposition: A | Discharge status: Home / Self Care | Payer: Medicaid Other | Source: Ambulatory Visit | Attending: Obstetrics and Gynecology | Admitting: Obstetrics and Gynecology

## 2021-08-16 VITALS — BP 114/74 | HR 77 | Wt 214.0 lb

## 2021-08-16 DIAGNOSIS — Z3482 Encounter for supervision of other normal pregnancy, second trimester: Secondary | ICD-10-CM | POA: Diagnosis present

## 2021-08-16 DIAGNOSIS — Z6838 Body mass index (BMI) 38.0-38.9, adult: Secondary | ICD-10-CM | POA: Insufficient documentation

## 2021-08-16 DIAGNOSIS — O99210 Obesity complicating pregnancy, unspecified trimester: Secondary | ICD-10-CM

## 2021-08-16 DIAGNOSIS — O99212 Obesity complicating pregnancy, second trimester: Secondary | ICD-10-CM

## 2021-08-16 DIAGNOSIS — O99213 Obesity complicating pregnancy, third trimester: Secondary | ICD-10-CM | POA: Insufficient documentation

## 2021-08-16 DIAGNOSIS — Z3A25 25 weeks gestation of pregnancy: Secondary | ICD-10-CM

## 2021-08-16 NOTE — Progress Notes (Signed)
ROB 25.3 GA ?Reports vag DC ?Clear, "a little thick", slight odor ?Reports "bumps on cervix", pt felt with finger ?

## 2021-08-16 NOTE — Progress Notes (Signed)
? ?  PRENATAL VISIT NOTE ? ?Subjective:  ?Lori Lam is a 26 y.o. [phone] at [redacted]w[redacted]d being seen today for ongoing prenatal care.  She is currently monitored for the following issues for this low-risk pregnancy and has H/O chlamydia infection; Asthma; Depression; Marijuana use; Mild concussion; STD exposure; Supervision of normal pregnancy; and Maternal obesity affecting pregnancy, antepartum on their problem list. ? ?Patient reports vaginal discharge.  Contractions: Not present. Vag. Bleeding: None.  Movement: Present. Denies leaking of fluid.  ? ?The following portions of the patient's history were reviewed and updated as appropriate: allergies, current medications, past family history, past medical history, past social history, past surgical history and problem list.  ? ?Objective:  ? ?Vitals:  ? 08/16/21 1036  ?BP: 114/74  ?Pulse: 77  ?Weight: 214 lb (97.1 kg)  ? ? ?Fetal Status: Fetal Heart Rate (bpm): 148 Fundal Height: 25 cm Movement: Present    ? ?General:  Alert, oriented and cooperative. Patient is in no acute distress.  ?Skin: Skin is warm and dry. No rash noted.   ?Cardiovascular: Normal heart rate noted  ?Respiratory: Normal respiratory effort, no problems with respiration noted  ?Abdomen: Soft, gravid, appropriate for gestational age.  Pain/Pressure: Present     ?Pelvic: Cervical exam deferred        ?Extremities: Normal range of motion.  Edema: None  ?Mental Status: Normal mood and affect. Normal behavior. Normal judgment and thought content.  ? ?Assessment and Plan:  ?Pregnancy: [id number] at [redacted]w[redacted]d ?1. Encounter for supervision of other normal pregnancy in second trimester ?Patient is doing well ?Vaginal swab collected ?Third trimester labs with glucola next visit ?Patient is not taking ASA ? ?Preterm labor symptoms and general obstetric precautions including but not limited to vaginal bleeding, contractions, leaking of fluid and fetal movement were reviewed in detail with the patient. ?Please refer  to After Visit Summary for other counseling recommendations.  ? ?Return in about 2 weeks (around 08/30/2021) for in person, ROB, Low risk, 2 hr glucola next visit. ? ?Future Appointments  ?Date Time Provider [place]  ?09/03/2021  9:15 AM [facility] NURSE [facility]  ?09/03/2021  9:30 AM [facility] US3 [facility]  ? ? ?Mora Bellman, MD ? ?

## 2021-08-17 LAB — CERVICOVAGINAL ANCILLARY ONLY
Bacterial Vaginitis (gardnerella): NEGATIVE
Candida Glabrata: NEGATIVE
Candida Vaginitis: NEGATIVE
Chlamydia: NEGATIVE
Comment: NEGATIVE
Comment: NEGATIVE
Comment: NEGATIVE
Comment: NEGATIVE
Comment: NEGATIVE
Comment: NORMAL
Neisseria Gonorrhea: NEGATIVE
Trichomonas: NEGATIVE

## 2021-08-26 ENCOUNTER — Encounter (HOSPITAL_COMMUNITY): Payer: Self-pay | Admitting: Obstetrics and Gynecology

## 2021-08-26 ENCOUNTER — Inpatient Hospital Stay (HOSPITAL_COMMUNITY)
Admission: AD | Admit: 2021-08-26 | Discharge: 2021-08-26 | Disposition: A | Discharge status: Home / Self Care | Payer: Medicaid Other | Attending: Obstetrics and Gynecology | Admitting: Obstetrics and Gynecology

## 2021-08-26 ENCOUNTER — Other Ambulatory Visit: Payer: Self-pay

## 2021-08-26 DIAGNOSIS — O26892 Other specified pregnancy related conditions, second trimester: Secondary | ICD-10-CM | POA: Diagnosis present

## 2021-08-26 DIAGNOSIS — Z3A26 26 weeks gestation of pregnancy: Secondary | ICD-10-CM

## 2021-08-26 DIAGNOSIS — R109 Unspecified abdominal pain: Secondary | ICD-10-CM | POA: Insufficient documentation

## 2021-08-26 DIAGNOSIS — O26899 Other specified pregnancy related conditions, unspecified trimester: Secondary | ICD-10-CM

## 2021-08-26 DIAGNOSIS — N898 Other specified noninflammatory disorders of vagina: Secondary | ICD-10-CM | POA: Diagnosis not present

## 2021-08-26 LAB — WET PREP, GENITAL
Clue Cells Wet Prep HPF POC: NONE SEEN
Sperm: NONE SEEN
Trich, Wet Prep: NONE SEEN
WBC, Wet Prep HPF POC: <10 (ref <10)
Yeast Wet Prep HPF POC: NONE SEEN

## 2021-08-26 LAB — URINALYSIS, ROUTINE W REFLEX MICROSCOPIC
Bilirubin Urine: NEGATIVE
Glucose, UA: NEGATIVE mg/dL
Hgb urine dipstick: NEGATIVE
Ketones, ur: NEGATIVE mg/dL
Leukocytes,Ua: NEGATIVE
Nitrite: NEGATIVE
Protein, ur: NEGATIVE mg/dL
Specific Gravity, Urine: 1.016 (ref 1.005–1.030)
pH: 6 (ref 5.0–8.0)

## 2021-08-26 NOTE — MAU Note (Signed)
.  Lori Lam is a 26 y.o. at [redacted]w[redacted]d here in MAU reporting: she was in the shower and has a thick chunk of mucus come out. Then  she started having lower pelvic pressure and back pain. Good fetal movement felt. Denies any vag bleeding.  ? ?Onset of complaint: 6pm ?Pain score: 7 ?Vitals:  ? 08/26/21 2128  ?BP: (!) 142/64  ?Pulse: 86  ?Resp: 18  ?Temp: 98.3 ?F (36.8 ?C)  ?   ?FHT:150 ?Lab orders placed from triage:  u/a ? ?

## 2021-08-26 NOTE — MAU Provider Note (Signed)
?History  ?  ? ?[id number] ? ?Arrival date and time: 08/26/21 2100 ?  ? ?Chief Complaint  ?Patient presents with  ? Pelvic Pain  ? Vaginal Discharge  ? ? ? ?HPI ?Lori Lam is a 26 y.o. at 78w5dby LMP who presents for vaginal discharge & pelvic pain. Symptoms started a few hours prior to arrival. States she was in the shower & noticed a glob of mucoid discharge. Since then has had mild pelvic cramping & low back pain. Denies LOF, vaginal bleeding, dysuria, n/v/d, or recent intercourse. Reports good fetal movement. Denies continued vaginal discharge nor vaginal itching/irritation.  ? ?OB History   ? ? Gravida  ?4  ? Para  ?2  ? Term  ?2  ? Preterm  ?0  ? AB  ?1  ? Living  ?2  ?  ? ? SAB  ?0  ? IAB  ?0  ? Ectopic  ?0  ? Multiple  ?0  ? Live Births  ?2  ?   ?  ?  ? ? ?Past Medical History:  ?Diagnosis Date  ? Anemia   ? Anxiety   ? Asthma   ? Bacterial vaginitis   ? Chlamydia contact, treated   ? GERD (gastroesophageal reflux disease)   ? ? ?Past Surgical History:  ?Procedure Laterality Date  ? HERNIA REPAIR    ? ? ?Family History  ?Problem Relation Age of Onset  ? Hypertension Mother   ? Hypertension Father   ? Hypertension Maternal Grandmother   ? Hypertension Maternal Grandfather   ? Hypertension Paternal Grandmother   ? Hypertension Other   ? Diabetes Other   ? ? ?No Known Allergies ? ?No current facility-administered medications on file prior to encounter.  ? ?Current Outpatient Medications on File Prior to Encounter  ?Medication Sig Dispense Refill  ? albuterol (PROAIR HFA) 108 (90 Base) MCG/ACT inhaler Inhale 1-2 puffs into the lungs every 6 (six) hours as needed for wheezing or shortness of breath. 18 g 1  ? DICLEGIS 10-10 MG TBEC Take 2 tablets by mouth at bedtime. If symptoms persist, add one tablet in the morning and one in the afternoon 100 tablet 5  ? pantoprazole (PROTONIX) 40 MG tablet Take 1 tablet (40 mg total) by mouth daily. 30 tablet 4  ? Prenatal Vit-Fe Fumarate-FA (PREPLUS) 27-1 MG TABS  Take 1 tablet by mouth daily. 30 tablet 13  ? Blood Pressure Monitoring (BLOOD PRESSURE KIT) DEVI 1 kit by Does not apply route once a week. 1 each 0  ? Elastic Bandages & Supports (COMFORT FIT MATERNITY SUPP LG) MISC Wear daily when ambulating (Patient not taking: Reported on 08/02/2021) 1 each 0  ? fluticasone-salmeterol (ADVAIR DISKUS) 100-50 MCG/ACT AEPB Inhale 1 puff into the lungs 2 (two) times daily. 60 each 3  ? promethazine (PHENERGAN) 25 MG tablet Take 1 tablet (25 mg total) by mouth every 6 (six) hours as needed for nausea or vomiting. (Patient not taking: Reported on 08/02/2021) 30 tablet 1  ? [DISCONTINUED] fluticasone (FLONASE) 50 MCG/ACT nasal spray Place 1 spray into both nostrils daily. 16 g 0  ? ? ? ?ROS ?Pertinent positives and negative per HPI, all others reviewed and negative ? ?Physical Exam  ? ?BP 130/71   Pulse 87   Temp 98.3 ?F (36.8 ?C)   Resp 18   LMP 02/20/2021 (Exact Date)  ? ?Patient Vitals for the past 24 hrs: ? BP Temp Pulse Resp  ?08/26/21 2201 130/71 -- 87 --  ?08/26/21  2136 126/72 -- 87 --  ?08/26/21 2128 (!) 142/64 98.3 ?F (36.8 ?C) 86 18  ? ? ?Physical Exam ?Vitals and nursing note reviewed. Exam conducted with a chaperone present.  ?Constitutional:   ?   General: She is not in acute distress. ?   Appearance: Normal appearance.  ?HENT:  ?   Head: Normocephalic and atraumatic.  ?Eyes:  ?   General: No scleral icterus. ?   Conjunctiva/sclera: Conjunctivae normal.  ?   Pupils: Pupils are equal, round, and reactive to light.  ?Pulmonary:  ?   Effort: Pulmonary effort is normal. No respiratory distress.  ?Skin: ?   General: Skin is warm and dry.  ?Neurological:  ?   Mental Status: She is alert.  ?Psychiatric:     ?   Mood and Affect: Mood normal.     ?   Behavior: Behavior normal.  ?  ? ?Cervical Exam ?Dilation: Closed ?Effacement (%): Thick ?Cervical Position: Posterior ?Station: Ballotable ?Exam by:: Jorje Guild NP ? ? ?FHT ?Baseline 150, moderate variability, 10x10 accels, no  decels ?Toco: none ?Cat: 1 ? ?Labs ?Results for orders placed or performed during the hospital encounter of 08/26/21 (from the past 24 hour(s))  ?Urinalysis, Routine w reflex microscopic Urine, Clean Catch     Status: Abnormal  ? Collection Time: 08/26/21  9:35 PM  ?Result Value Ref Range  ? Color, Urine YELLOW YELLOW  ? APPearance HAZY (A) CLEAR  ? Specific Gravity, Urine 1.016 1.005 - 1.030  ? pH 6.0 5.0 - 8.0  ? Glucose, UA NEGATIVE NEGATIVE mg/dL  ? Hgb urine dipstick NEGATIVE NEGATIVE  ? Bilirubin Urine NEGATIVE NEGATIVE  ? Ketones, ur NEGATIVE NEGATIVE mg/dL  ? Protein, ur NEGATIVE NEGATIVE mg/dL  ? Nitrite NEGATIVE NEGATIVE  ? Leukocytes,Ua NEGATIVE NEGATIVE  ?Wet prep, genital     Status: None  ? Collection Time: 08/26/21  9:58 PM  ? Specimen: Vaginal  ?Result Value Ref Range  ? Yeast Wet Prep HPF POC NONE SEEN NONE SEEN  ? Trich, Wet Prep NONE SEEN NONE SEEN  ? Clue Cells Wet Prep HPF POC NONE SEEN NONE SEEN  ? WBC, Wet Prep HPF POC <10 <10  ? Sperm NONE SEEN   ? ? ?Imaging ?No results found. ? ?MAU Course  ?Procedures ?Lab Orders    ?     Wet prep, genital    ?     Urinalysis, Routine w reflex microscopic Urine, Clean Catch    ?No orders of the defined types were placed in this encounter. ? ?Imaging Orders  ?No imaging studies ordered today  ? ? ?MDM ?Intake BP elevated. Repeats normal. No history.  ? ?Cervix closed/thick. Scant gritty white discharge. Wet prep negative. Back pain treated with hot pack - patient declined tylenol. U/a negative.  ?Assessment and Plan  ? ?1. Vaginal discharge during pregnancy in second trimester   ?2. Abdominal cramping affecting pregnancy   ?3. [redacted] weeks gestation of pregnancy   ? ?-Reviewed reasons to return to MAU ? ?Jorje Guild, NP ?08/26/21 ?10:59 PM ? ? ?

## 2021-09-03 ENCOUNTER — Other Ambulatory Visit: Payer: Self-pay | Admitting: *Deleted

## 2021-09-03 ENCOUNTER — Encounter: Payer: Self-pay | Admitting: *Deleted

## 2021-09-03 ENCOUNTER — Ambulatory Visit: Payer: Medicaid Other | Admitting: *Deleted

## 2021-09-03 ENCOUNTER — Ambulatory Visit (HOSPITAL_BASED_OUTPATIENT_CLINIC_OR_DEPARTMENT_OTHER): Payer: Medicaid Other | Admitting: *Deleted

## 2021-09-03 ENCOUNTER — Ambulatory Visit (HOSPITAL_BASED_OUTPATIENT_CLINIC_OR_DEPARTMENT_OTHER): Payer: Medicaid Other | Admitting: Maternal & Fetal Medicine

## 2021-09-03 ENCOUNTER — Other Ambulatory Visit: Payer: Self-pay | Admitting: Obstetrics

## 2021-09-03 ENCOUNTER — Ambulatory Visit: Payer: Medicaid Other | Attending: Obstetrics and Gynecology

## 2021-09-03 VITALS — BP 106/55 | HR 80

## 2021-09-03 DIAGNOSIS — O99212 Obesity complicating pregnancy, second trimester: Secondary | ICD-10-CM

## 2021-09-03 DIAGNOSIS — Z6833 Body mass index (BMI) 33.0-33.9, adult: Secondary | ICD-10-CM | POA: Insufficient documentation

## 2021-09-03 DIAGNOSIS — O99512 Diseases of the respiratory system complicating pregnancy, second trimester: Secondary | ICD-10-CM | POA: Insufficient documentation

## 2021-09-03 DIAGNOSIS — O365930 Maternal care for other known or suspected poor fetal growth, third trimester, not applicable or unspecified: Secondary | ICD-10-CM | POA: Diagnosis not present

## 2021-09-03 DIAGNOSIS — O99210 Obesity complicating pregnancy, unspecified trimester: Secondary | ICD-10-CM | POA: Insufficient documentation

## 2021-09-03 DIAGNOSIS — Z364 Encounter for antenatal screening for fetal growth retardation: Secondary | ICD-10-CM

## 2021-09-03 DIAGNOSIS — J45909 Unspecified asthma, uncomplicated: Secondary | ICD-10-CM | POA: Diagnosis not present

## 2021-09-03 DIAGNOSIS — O365921 Maternal care for other known or suspected poor fetal growth, second trimester, fetus 1: Secondary | ICD-10-CM

## 2021-09-03 DIAGNOSIS — O99213 Obesity complicating pregnancy, third trimester: Secondary | ICD-10-CM

## 2021-09-03 DIAGNOSIS — E669 Obesity, unspecified: Secondary | ICD-10-CM | POA: Diagnosis not present

## 2021-09-03 DIAGNOSIS — Z3A27 27 weeks gestation of pregnancy: Secondary | ICD-10-CM

## 2021-09-03 NOTE — Progress Notes (Signed)
MFM Brief Note ? ?Lori Lam is a 26 yo G4P2 who is seen today at the request of Bertram Denver, NP for follow up growth due an EFW 12%. ? ?Single intrauterine pregnancy ?Normal anatomy with measurements less than dates with new diagnosis of severe fetal growth restriction. EFW 1.9%. ?There is good fetal movement and amniotic fluid volume ?The UA Dopplers are elevated today there was one UA Doppler demontrating absent EDF but this was not repeated and was 1 of 4 measurement. ?NST performed and was reactive with two variable decelerations noted.  ? ?I discussed today's visit with a diagnosis of FGR. I explained that the etiology includes placental insufficiency, chronic disease, infection, aneuploidy and other genetic syndromes. She has a low risk NIPS. She has no additional risk factors for chronic disease. At this time I explained the diagnosis, evaluation and management to include on going fetal growth and weekly antenatal testing to include UA Dopplers.  ? ?Given than the EFW < 3rd% and elevated UA Dopplers. I recommend delivery at 37 weeks otherwise if all is normal consider delivery at 39 weeks. ? ?She is scheduled to return on Monday for an NST and then thursday for a NST/BPP/UA ?Growth in 3 weeks.  ? ?I spent 20 minutes with > 50% in face to face consultation. ? ?Novella Olive, MD ?

## 2021-09-03 NOTE — Procedures (Signed)
Lori Lam ?1995-08-16 ?[redacted]w[redacted]d ? ?Fetus A Non-Stress Test Interpretation for 09/03/21 ? ?Indication: fetal growth restriction ? ?Fetal Heart Rate A ?Mode: External ?Baseline Rate (A): 145 bpm ?Variability: Moderate ?Accelerations: 15 x 15 ?Decelerations: Variable ?Multiple birth?: No ? ?Uterine Activity ?Mode: Toco ?Contraction Frequency (min): none ?Resting Tone Palpated: Relaxed ? ?Interpretation (Fetal Testing) ?Nonstress Test Interpretation: Reactive ?Overall Impression: Reassuring for gestational age ?Comments: tracing reviewed by Dr. Grace Bushy ? ? ?

## 2021-09-05 ENCOUNTER — Encounter (HOSPITAL_COMMUNITY): Payer: Self-pay | Admitting: Obstetrics and Gynecology

## 2021-09-05 ENCOUNTER — Other Ambulatory Visit: Payer: Self-pay

## 2021-09-05 ENCOUNTER — Inpatient Hospital Stay (HOSPITAL_COMMUNITY)
Admission: AD | Admit: 2021-09-05 | Discharge: 2021-09-05 | Disposition: A | Discharge status: Home / Self Care | Payer: Medicaid Other | Attending: Obstetrics and Gynecology | Admitting: Obstetrics and Gynecology

## 2021-09-05 DIAGNOSIS — O368130 Decreased fetal movements, third trimester, not applicable or unspecified: Secondary | ICD-10-CM | POA: Diagnosis not present

## 2021-09-05 DIAGNOSIS — Z3A28 28 weeks gestation of pregnancy: Secondary | ICD-10-CM | POA: Diagnosis not present

## 2021-09-05 DIAGNOSIS — J302 Other seasonal allergic rhinitis: Secondary | ICD-10-CM | POA: Diagnosis not present

## 2021-09-05 DIAGNOSIS — J45909 Unspecified asthma, uncomplicated: Secondary | ICD-10-CM

## 2021-09-05 DIAGNOSIS — J4541 Moderate persistent asthma with (acute) exacerbation: Secondary | ICD-10-CM | POA: Insufficient documentation

## 2021-09-05 DIAGNOSIS — Z7951 Long term (current) use of inhaled steroids: Secondary | ICD-10-CM | POA: Insufficient documentation

## 2021-09-05 DIAGNOSIS — Z79899 Other long term (current) drug therapy: Secondary | ICD-10-CM | POA: Insufficient documentation

## 2021-09-05 DIAGNOSIS — O99513 Diseases of the respiratory system complicating pregnancy, third trimester: Secondary | ICD-10-CM | POA: Diagnosis not present

## 2021-09-05 DIAGNOSIS — O368131 Decreased fetal movements, third trimester, fetus 1: Secondary | ICD-10-CM

## 2021-09-05 LAB — URINALYSIS, ROUTINE W REFLEX MICROSCOPIC
Bilirubin Urine: NEGATIVE
Glucose, UA: NEGATIVE mg/dL
Hgb urine dipstick: NEGATIVE
Ketones, ur: NEGATIVE mg/dL
Leukocytes,Ua: NEGATIVE
Nitrite: NEGATIVE
Protein, ur: NEGATIVE mg/dL
Specific Gravity, Urine: 1.003 — ABNORMAL LOW (ref 1.005–1.030)
pH: 7 (ref 5.0–8.0)

## 2021-09-05 MED ORDER — IPRATROPIUM-ALBUTEROL 0.5-2.5 (3) MG/3ML IN SOLN
3.0000 mL | RESPIRATORY_TRACT | Status: DC
  Administered 2021-09-05: 3 mL via RESPIRATORY_TRACT
  Filled 2021-09-05: qty 3

## 2021-09-05 MED ORDER — LORATADINE 10 MG PO TABS
10.0000 mg | ORAL_TABLET | Freq: Every day | ORAL | Status: DC
  Administered 2021-09-05: 10 mg via ORAL
  Filled 2021-09-05: qty 1

## 2021-09-05 MED ORDER — MONTELUKAST SODIUM 10 MG PO TABS: 10.0000 mg | ORAL_TABLET | Freq: Every day | ORAL | 6 refills | Status: DC

## 2021-09-05 MED ORDER — PROAIR RESPICLICK 108 (90 BASE) MCG/ACT IN AEPB: 2.0000 | INHALATION_SPRAY | RESPIRATORY_TRACT | 12 refills | Status: DC | PRN

## 2021-09-05 MED ORDER — FLUTICASONE-SALMETEROL 250-50 MCG/ACT IN AEPB: 1.0000 | INHALATION_SPRAY | Freq: Two times a day (BID) | RESPIRATORY_TRACT | 6 refills | Status: DC

## 2021-09-05 MED ORDER — PROAIR HFA 108 (90 BASE) MCG/ACT IN AERS: 1.0000 | INHALATION_SPRAY | Freq: Four times a day (QID) | RESPIRATORY_TRACT | 1 refills | Status: DC | PRN

## 2021-09-05 MED ORDER — LORATADINE 10 MG PO TABS: 10.0000 mg | ORAL_TABLET | Freq: Every day | ORAL | 6 refills | Status: DC

## 2021-09-05 NOTE — MAU Note (Signed)
Lori Lam is a 26 y.o. at [redacted]w[redacted]d here in MAU reporting: DFM since she woke up this AM around 8. Also having back pain. No bleeding or LOF. Pt requesting an inhaler because she is having some trouble breathing. Has hx of asthma and states it has "been a minute" since she has been seen by a doctor regarding this.  ? ?Onset of complaint: today ? ?Pain score: 6/10 ? ?Vitals:  ? 09/05/21 1022  ?BP: 120/83  ?Pulse: 87  ?Resp: 18  ?Temp: 98.2 ?F (36.8 ?C)  ?SpO2: 99%  ?   ?FHT:155 ? ?Lab orders placed from triage: UA ? ?

## 2021-09-05 NOTE — MAU Provider Note (Signed)
Chief Complaint: Back Pain and Decreased Fetal Movement ? ? Event Date/Time  ? First Provider Initiated Contact with Patient 09/05/21 1037   ?  ?SUBJECTIVE ?HPI: Lori Lam is a 26 y.o. [phone] at 86w1dwho presents to [facility]aternity Admissions reporting asthma exacerbation and decreased fetal movement.  Has been prescribed Advair and albuterol for asthma.  Also feels like she is experiencing seasonal allergies.  Has been prescribed Claritin and Singulair in the past but is not taking any allergy medicine right now.  Asthma meds are prescribed by her obstetrician.  Does not have a PCP or pulmonologist.  Reports inconsistent use of Advair 100/50.  Last dose yesterday.  Has been needing to use albuterol inhaler around 5 times per day lately.  Feels like asthma is worse in pregnancy.  Ran out of her albuterol inhaler.  States the Ventolin brand does not work as well as PShip brokerdoes. ? ?Denies fever, chills, sick contacts.  Reports nasal congestion and occasional dry cough consistent with usual allergy and asthma symptoms. ? ?Reports decreased fetal movement today.  Known fetal growth restriction this pregnancy.  Reports that she has been feeling baby move since arriving to MAU.  Denies leaking, bleeding or contractions.. ? ? ?Past Medical History:  ?Diagnosis Date  ? Anemia   ? Anxiety   ? Asthma   ? Bacterial vaginitis   ? Chlamydia contact, treated   ? GERD (gastroesophageal reflux disease)   ? ?OB History  ?Gravida Para Term Preterm AB Living  ?'4 2 2 ' 0 1 2  ?SAB IAB Ectopic Multiple Live Births  ?0 0 0 0 2  ?  ?# Outcome Date GA Lbr Len/2nd Weight Sex Delivery Anes PTL Lv  ?4 Current           ?3 Term 09/25/16 367w5d7:05 / 00:17 3495 g M Vag-Spont EPI  LIV  ?2 Term 12/27/12 3877w4d:49 / 00:37 3073 g M Vag-Spont EPI  LIV  ?1 AB      TAB     ? ?Past Surgical History:  ?Procedure Laterality Date  ? HERNIA REPAIR    ? ?Social History  ? ?Socioeconomic History  ? Marital status: Single  ?  Spouse name: Not on file  ?  Number of children: Not on file  ? Years of education: Not on file  ? Highest education level: Not on file  ?Occupational History  ? Not on file  ?Tobacco Use  ? Smoking status: Former  ?  Packs/day: 0.25  ?  Types: Cigarettes  ?  Quit date: 03/2021  ?  Years since quitting: 0.5  ? Smokeless tobacco: Never  ? Tobacco comments:  ?  2 cigs/day  ?Vaping Use  ? Vaping Use: Never used  ?Substance and Sexual Activity  ? Alcohol use: No  ?  Comment: socially, not since confirmed pregnancy  ? Drug use: No  ? Sexual activity: Yes  ?  Partners: Male  ?  Birth control/protection: None  ?Other Topics Concern  ? Not on file  ?Social History Narrative  ? Not on file  ? ?Social Determinants of Health  ? ?Financial Resource Strain: Not on file  ?Food Insecurity: Not on file  ?Transportation Needs: Not on file  ?Physical Activity: Not on file  ?Stress: Not on file  ?Social Connections: Not on file  ?Intimate Partner Violence: Not on file  ? ?Family History  ?Problem Relation Age of Onset  ? Hypertension Mother   ? Hypertension Father   ?  Hypertension Maternal Grandmother   ? Hypertension Maternal Grandfather   ? Hypertension Paternal Grandmother   ? Hypertension Other   ? Diabetes Other   ? ?No current facility-administered medications on file prior to encounter.  ? ?Current Outpatient Medications on File Prior to Encounter  ?Medication Sig Dispense Refill  ? Blood Pressure Monitoring (BLOOD PRESSURE KIT) DEVI 1 kit by Does not apply route once a week. 1 each 0  ? DICLEGIS 10-10 MG TBEC Take 2 tablets by mouth at bedtime. If symptoms persist, add one tablet in the morning and one in the afternoon 100 tablet 5  ? Elastic Bandages & Supports (COMFORT FIT MATERNITY SUPP LG) MISC Wear daily when ambulating (Patient not taking: Reported on 08/02/2021) 1 each 0  ? pantoprazole (PROTONIX) 40 MG tablet Take 1 tablet (40 mg total) by mouth daily. 30 tablet 4  ? Prenatal Vit-Fe Fumarate-FA (PREPLUS) 27-1 MG TABS Take 1 tablet by mouth daily. 30  tablet 13  ? promethazine (PHENERGAN) 25 MG tablet Take 1 tablet (25 mg total) by mouth every 6 (six) hours as needed for nausea or vomiting. (Patient not taking: Reported on 08/02/2021) 30 tablet 1  ? [DISCONTINUED] fluticasone (FLONASE) 50 MCG/ACT nasal spray Place 1 spray into both nostrils daily. 16 g 0  ? ?No Known Allergies ? ?I have reviewed patient's Past Medical Hx, Surgical Hx, Family Hx, Social Hx, medications and allergies.  ? ?Review of Systems  ?Constitutional:  Negative for chills and fever.  ?HENT:  Positive for congestion. Negative for sore throat.   ?Respiratory:  Positive for cough and wheezing. Negative for shortness of breath.   ?Gastrointestinal:  Negative for abdominal pain.  ?Genitourinary:  Negative for vaginal bleeding.  ? ?OBJECTIVE ?Patient Vitals for the past 24 hrs: ? BP Temp Temp src Pulse Resp SpO2  ?09/05/21 1255 128/80 -- -- 88 -- --  ?09/05/21 1159 -- -- -- -- -- 99 %  ?09/05/21 1022 120/83 98.2 ?F (36.8 ?C) Oral 87 18 99 %  ? ?Constitutional: Well-developed, well-nourished female in no acute distress.  ?Cardiovascular: normal rate ?Respiratory: normal rate.  Mildly increased work of breathing.  Positive wheezing throughout all lung fields.  No stridor.  Positive lung sounds in all fields. ?GI: Abd soft, non-tender, gravid appropriate for gestational age.  ?Neurologic: Alert and oriented x 4.  ?GU:  ? ?EFM: Baseline: 155 bpm, Variability: Good {> 6 bpm), Accelerations: Reactive, and Decelerations: Absent ?Toco: none ? ? ?LAB RESULTS ?Results for orders placed or performed during the hospital encounter of 09/05/21 (from the past 24 hour(s))  ?Urinalysis, Routine w reflex microscopic Urine, Clean Catch     Status: Abnormal  ? Collection Time: 09/05/21 11:05 AM  ?Result Value Ref Range  ? Color, Urine STRAW (A) YELLOW  ? APPearance CLEAR CLEAR  ? Specific Gravity, Urine 1.003 (L) 1.005 - 1.030  ? pH 7.0 5.0 - 8.0  ? Glucose, UA NEGATIVE NEGATIVE mg/dL  ? Hgb urine dipstick NEGATIVE  NEGATIVE  ? Bilirubin Urine NEGATIVE NEGATIVE  ? Ketones, ur NEGATIVE NEGATIVE mg/dL  ? Protein, ur NEGATIVE NEGATIVE mg/dL  ? Nitrite NEGATIVE NEGATIVE  ? Leukocytes,Ua NEGATIVE NEGATIVE  ? ? ?IMAGING ?NA ? ?MAU COURSE ?Orders Placed This Encounter  ?Procedures  ? Urinalysis, Routine w reflex microscopic Urine, Clean Catch  ? Consult to respiratory care treatment  ? Discharge patient  ? ?Meds ordered this encounter  ?Medications  ? loratadine (CLARITIN) tablet 10 mg  ? ipratropium-albuterol (DUONEB) 0.5-2.5 (3) MG/3ML nebulizer  solution 3 mL  ? fluticasone-salmeterol (ADVAIR DISKUS) 250-50 MCG/ACT AEPB  ?  Sig: Inhale 1 puff into the lungs in the morning and at bedtime.  ?  Dispense:  1 each  ?  Refill:  6  ?  Order Specific Question:   Supervising Provider  ?  Answer:   Griffin Basil [[id number]]  ? PROAIR HFA 108 (90 Base) MCG/ACT inhaler  ?  Sig: Inhale 1-2 puffs into the lungs every 6 (six) hours as needed for wheezing or shortness of breath.  ?  Dispense:  18 g  ?  Refill:  1  ?  Order Specific Question:   Supervising Provider  ?  Answer:   Griffin Basil [[id number]]  ? loratadine (CLARITIN) 10 MG tablet  ?  Sig: Take 1 tablet (10 mg total) by mouth daily.  ?  Dispense:  30 tablet  ?  Refill:  6  ?  Order Specific Question:   Supervising Provider  ?  Answer:   Griffin Basil [[id number]]  ? montelukast (SINGULAIR) 10 MG tablet  ?  Sig: Take 1 tablet (10 mg total) by mouth at bedtime.  ?  Dispense:  30 tablet  ?  Refill:  6  ?  Order Specific Question:   Supervising Provider  ?  Answer:   Griffin Basil [[id number]]  ? Albuterol Sulfate (PROAIR RESPICLICK) 366 (90 Base) MCG/ACT AEPB  ?  Sig: Inhale 2 puffs into the lungs every 4 (four) hours as needed.  ?  Dispense:  1 each  ?  Refill:  12  ?  Order Specific Question:   Supervising Provider  ?  Answer:   Griffin Basil [[id number]]  ? ?RT consult made.  DuoNeb nebulizer treatment given.  Claritin treatment given. ? ?Patient feeling much better.  Wheezing decreased  significantly.  Patient feels ready to go home and declines second DuoNeb treatment. ? ?MDM ?-Patient with acute exacerbation of asthma.  Possibly triggered by seasonal allergies and respiratory change

## 2021-09-06 ENCOUNTER — Other Ambulatory Visit: Payer: Medicaid Other

## 2021-09-06 ENCOUNTER — Ambulatory Visit (INDEPENDENT_AMBULATORY_CARE_PROVIDER_SITE_OTHER): Payer: Medicaid Other | Admitting: Obstetrics and Gynecology

## 2021-09-06 ENCOUNTER — Telehealth: Payer: Self-pay | Admitting: Family Medicine

## 2021-09-06 VITALS — BP 118/79 | HR 84 | Wt 215.0 lb

## 2021-09-06 DIAGNOSIS — Z23 Encounter for immunization: Secondary | ICD-10-CM | POA: Diagnosis not present

## 2021-09-06 DIAGNOSIS — O365930 Maternal care for other known or suspected poor fetal growth, third trimester, not applicable or unspecified: Secondary | ICD-10-CM | POA: Diagnosis not present

## 2021-09-06 DIAGNOSIS — Z3482 Encounter for supervision of other normal pregnancy, second trimester: Secondary | ICD-10-CM

## 2021-09-06 DIAGNOSIS — O365920 Maternal care for other known or suspected poor fetal growth, second trimester, not applicable or unspecified: Secondary | ICD-10-CM

## 2021-09-06 DIAGNOSIS — Z3A28 28 weeks gestation of pregnancy: Secondary | ICD-10-CM | POA: Diagnosis not present

## 2021-09-06 DIAGNOSIS — O365990 Maternal care for other known or suspected poor fetal growth, unspecified trimester, not applicable or unspecified: Secondary | ICD-10-CM | POA: Insufficient documentation

## 2021-09-06 NOTE — Progress Notes (Signed)
Subjective:  ?Lori Lam is a 26 y.o. [phone] at [redacted]w[redacted]d being seen today for ongoing prenatal care.  She is currently monitored for the following issues for this high-risk pregnancy and has H/O chlamydia infection; Asthma; Depression; Marijuana use; Mild concussion; Supervision of normal pregnancy; Maternal obesity affecting pregnancy, antepartum; and IUGR (intrauterine growth restriction) affecting care of mother on their problem list. ? ?Patient reports general discomforts of pregnancy. Asthma better.  Contractions: Not present. Vag. Bleeding: None.  Movement: Present. Denies leaking of fluid.  ? ?The following portions of the patient's history were reviewed and updated as appropriate: allergies, current medications, past family history, past medical history, past social history, past surgical history and problem list. Problem list updated. ? ?Objective:  ? ?Vitals:  ? 09/06/21 0919  ?BP: 118/79  ?Pulse: 84  ?Weight: 215 lb (97.5 kg)  ? ? ?Fetal Status: Fetal Heart Rate (bpm): 150   Movement: Present    ? ?General:  Alert, oriented and cooperative. Patient is in no acute distress.  ?Skin: Skin is warm and dry. No rash noted.   ?Cardiovascular: Normal heart rate noted  ?Respiratory: Normal respiratory effort, no problems with respiration noted  ?Abdomen: Soft, gravid, appropriate for gestational age. Pain/Pressure: Absent     ?Pelvic:  Cervical exam deferred        ?Extremities: Normal range of motion.  Edema: None  ?Mental Status: Normal mood and affect. Normal behavior. Normal judgment and thought content.  ? ?Urinalysis:     ? ?Assessment and Plan:  ?Pregnancy: [id number] at [redacted]w[redacted]d ? ?1. Encounter for supervision of other normal pregnancy in second trimester ?Stable ?Transferring to [facility] at pt request due to serial growth scans and antenatal testing ?- Glucose Tolerance, 2 Hours w/1 Hour ?- CBC ?- RPR ?- HIV Antibody (routine testing w rflx) ?- Tdap vaccine greater than or equal to 7yo IM ? ?2. Poor fetal  growth affecting management of mother in second trimester, single or unspecified fetus ?Serial growth scans and antenatal testing as per MFM ? ?3. Asthma ?Will complete PA for MDI ?Pt instructed to take both Claratin and Singular ? ? ?Preterm labor symptoms and general obstetric precautions including but not limited to vaginal bleeding, contractions, leaking of fluid and fetal movement were reviewed in detail with the patient. ?Please refer to After Visit Summary for other counseling recommendations.  ?Return in about 2 weeks (around 09/20/2021). ? ? ?Chancy Milroy, MD ?

## 2021-09-06 NOTE — Patient Instructions (Addendum)
Fetal Growth Restriction ? ?Fetal growth restriction, also known as intrauterine growth restriction (IUGR), is when a baby is not growing normally during pregnancy. A baby with fetal growth restriction is smaller than he or she should be and may weigh less than normal at birth. ?Fetal growth restriction can result from a problem with the placenta, which is an organ that supplies the unborn baby (fetus) with oxygen and nutrition. Babies with fetal growth restriction are at higher risk for early delivery and may need more care than usual after birth. ?What are the causes? ?The most common cause of fetal growth restriction is a problem with the placenta or umbilical cord that causes the fetus to get less oxygen or nutrition than needed. Other causes include: ?Poor maternal nutrition and not enough weight gain during pregnancy. ?Exposure to chemicals found in substances such as cigarettes, alcohol, and some drugs. ?Some prescription medicines. ?Other problems that develop in the womb (congenital birth defects). ?Genetic disorders. ?Infection. ?Carrying more than one baby. ?What increases the risk? ?This condition is more likely to affect your baby if you: ?Are older than age 28 or younger than age 28. ?Have medical conditions such as high blood pressure, preeclampsia, diabetes, heart or kidney disease, systemic lupus erythematosus, or anemia. ?Live at a very high altitude during pregnancy. ?Have a personal history or family history of: ?Fetal growth restriction. ?A genetic disorder. ?Have had treatments to help have children (infertility treatments). ?What are the signs or symptoms? ?Fetal growth restriction does not cause many symptoms. Your health care provider may suspect this condition if your belly area (fundus) is not as big as expected for the stage of your pregnancy. ?How is this diagnosed? ?This condition is diagnosed with physical exams and prenatal exams. You may also have: ?Fundal height measurements to  check the size of your uterus. The fundal height is the distance from the pubic bone to the top of the uterus. ?An ultrasound done to measure your baby's size compared to the size of other babies at the same stage of development (gestational age). ?You may also have tests to find the cause of fetal growth restriction. These may include: ?Amniocentesis. This is a procedure that involves passing a needle into the uterus to collect a sample of fluid that surrounds the fetus (amniotic fluid). This may be done to check for signs of infection or congenital defects. ?Tests to evaluate blood flow to your baby and placenta. ?How is this treated? ?In most cases, the goal of treatment is to treat the cause of fetal growth restriction. Your health care providers will monitor your pregnancy closely and help you manage your pregnancy. ?If your condition is caused by a problem with the placenta and your baby is not getting enough blood, you may need: ?Medicine to start labor and deliver your baby early (induction). ?Cesarean delivery, also called a C-section. In this procedure, your baby is delivered through an incision in your abdomen and uterus. ?Follow these instructions at home: ?Medicines ?Take over-the-counter and prescription medicines only as told by your health care provider. This includes vitamins and supplements. ?Make sure that your health care provider knows about and approves of all medicines, supplements, vitamins, eye drops, and creams that you use. ?General instructions ?Eat a healthy diet that includes fresh fruits and vegetables, lean proteins, whole grains, and calcium-rich foods such as milk, yogurt, and dark, leafy greens. Work with your health care provider or a dietitian to make sure that: ?You are getting enough nutrients. ?You are  gaining enough weight during your pregnancy. ?Rest as needed. Try to get at least 8 hours of sleep every night. ?Do not drink alcohol or use drugs. ?Do not use any products that  contain nicotine or tobacco. These products include cigarettes, chewing tobacco, and vaping devices, such as e-cigarettes. If you need help quitting, ask your health care provider. ?Keep all follow-up visits. This is important. ?Get help right away if: ?You notice that your unborn baby is moving less than usual or is not moving. ?You have contractions that are 5 minutes or less apart, or that increase in frequency, intensity, or length. ?You have signs and symptoms of infection, including a fever. ?You have vaginal bleeding. ?You have increased swelling in your legs, hands, or face. ?You have vision changes, including seeing spots or having blurry or double vision. ?You have a severe headache that does not go away. ?You have a sudden, sharp pain in the abdomen or low back pain. ?You have an uncontrolled gush or trickle of fluid from your vagina. ?Summary ?Fetal growth restriction is when a baby is not growing normally during pregnancy. ?The most common cause of fetal growth restriction is a problem with the placenta or umbilical cord that causes the fetus to get less oxygen or nutrition than needed. ?This condition is diagnosed with physical and prenatal exams. ?Your health care provider will monitor your baby's growth with ultrasounds throughout pregnancy. ?Make sure that your health care provider knows about and approves of all medicines, supplements, vitamins, eye drops, and creams that you use. ?This information is not intended to replace advice given to you by your health care provider. Make sure you discuss any questions you have with your health care provider. ?Document Revised: 12/09/2019 Document Reviewed: 12/09/2019 ?Elsevier Patient Education ? 2023 [employer] ?Third Trimester of Pregnancy ? ?The third trimester of pregnancy is from week 28 through week 40. This is months 7 through 9. The third trimester is a time when the unborn baby (fetus) is growing rapidly. At the end of the ninth month, the fetus  is about 20 inches long and weighs 6-10 pounds. ?Body changes during your third trimester ?During the third trimester, your body will continue to go through many changes. The changes vary and generally return to normal after your baby is born. ?Physical changes ?Your weight will continue to increase. You can expect to gain 25-35 pounds (11-16 kg) by the end of the pregnancy if you begin pregnancy at a normal weight. If you are underweight, you can expect to gain 28-40 lb (about 13-18 kg), and if you are overweight, you can expect to gain 15-25 lb (about 7-11 kg). ?You may begin to get stretch marks on your hips, abdomen, and breasts. ?Your breasts will continue to grow and may hurt. A yellow fluid (colostrum) may leak from your breasts. This is the first milk you are producing for your baby. ?You may have changes in your hair. These can include thickening of your hair, rapid growth, and changes in texture. Some people also have hair loss during or after pregnancy, or hair that feels dry or thin. ?Your belly button may stick out. ?You may notice more swelling in your hands, face, or ankles. ?Health changes ?You may have heartburn. ?You may have constipation. ?You may develop hemorrhoids. ?You may develop swollen, bulging veins (varicose veins) in your legs. ?You may have increased body aches in the pelvis, back, or thighs. This is due to weight gain and increased hormones that are relaxing  your joints. ?You may have increased tingling or numbness in your hands, arms, and legs. The skin on your abdomen may also feel numb. ?You may feel short of breath because of your expanding uterus. ?Other changes ?You may urinate more often because the fetus is moving lower into your pelvis and pressing on your bladder. ?You may have more problems sleeping. This may be caused by the size of your abdomen, an increased need to urinate, and an increase in your body's metabolism. ?You may notice the fetus "dropping," or moving lower in  your abdomen (lightening). ?You may have increased vaginal discharge. ?You may notice that you have pain around your pelvic bone as your uterus distends. ?Follow these instructions at home: ?Medicines

## 2021-09-06 NOTE — Progress Notes (Signed)
ROB 28.[redacted] wks GA ?GTT, CBC, HIV, RPR today ?TDAP offered and accepted ?RH negative ?Depression and anxiety screen negative ? ?Nurse with patient reports pt's asthma is not well controlled. Recent MAU visit with nebulizer treatment ?

## 2021-09-06 NOTE — Telephone Encounter (Signed)
Received a message from [facility] to call in regards to this patient, the patient had stated she wanted [facility] due to her having to have an appointment with MFM every 3 days and to make the location more convenient for the patient. The patient was scheduled for the next available at the [facility] to accommodate her request and provide patient satisfaction.  ?

## 2021-09-07 ENCOUNTER — Ambulatory Visit: Payer: Medicaid Other | Admitting: *Deleted

## 2021-09-07 ENCOUNTER — Ambulatory Visit: Payer: Medicaid Other

## 2021-09-07 ENCOUNTER — Other Ambulatory Visit: Payer: Medicaid Other

## 2021-09-07 ENCOUNTER — Ambulatory Visit: Payer: Medicaid Other | Attending: Maternal & Fetal Medicine | Admitting: *Deleted

## 2021-09-07 VITALS — BP 110/58 | HR 84

## 2021-09-07 DIAGNOSIS — O365931 Maternal care for other known or suspected poor fetal growth, third trimester, fetus 1: Secondary | ICD-10-CM | POA: Diagnosis present

## 2021-09-07 DIAGNOSIS — Z3483 Encounter for supervision of other normal pregnancy, third trimester: Secondary | ICD-10-CM

## 2021-09-07 DIAGNOSIS — O99213 Obesity complicating pregnancy, third trimester: Secondary | ICD-10-CM | POA: Insufficient documentation

## 2021-09-07 DIAGNOSIS — O99210 Obesity complicating pregnancy, unspecified trimester: Secondary | ICD-10-CM

## 2021-09-07 DIAGNOSIS — Z3A28 28 weeks gestation of pregnancy: Secondary | ICD-10-CM | POA: Insufficient documentation

## 2021-09-07 LAB — GLUCOSE TOLERANCE, 2 HOURS W/ 1HR
Glucose, 1 hour: 83 mg/dL (ref 70–179)
Glucose, 2 hour: 71 mg/dL (ref 70–152)
Glucose, Fasting: 71 mg/dL (ref 70–91)

## 2021-09-07 LAB — CBC
Hematocrit: 33.6 % — ABNORMAL LOW (ref 34.0–46.6)
Hemoglobin: 11.6 g/dL (ref 11.1–15.9)
MCH: 30.1 pg (ref 26.6–33.0)
MCHC: 34.5 g/dL (ref 31.5–35.7)
MCV: 87 fL (ref 79–97)
Platelets: 278 10*3/uL (ref 150–450)
RBC: 3.86 x10E6/uL (ref 3.77–5.28)
RDW: 12.7 % (ref 11.7–15.4)
WBC: 10.7 10*3/uL (ref 3.4–10.8)

## 2021-09-07 LAB — HIV ANTIBODY (ROUTINE TESTING W REFLEX): HIV Screen 4th Generation wRfx: NONREACTIVE

## 2021-09-07 LAB — RPR: RPR Ser Ql: NONREACTIVE

## 2021-09-07 NOTE — Procedures (Signed)
Lori Lam ?[birth year] ?[redacted]w[redacted]d ? ?Fetus A Non-Stress Test Interpretation for 09/07/21 ? ?Indication: IUGR ? ?Fetal Heart Rate A ?Mode: External ?Baseline Rate (A): 150 bpm ?Variability: Moderate ?Accelerations: 15 x 15 ?Decelerations: None ?Multiple birth?: No ? ?Uterine Activity ?Mode: Toco ?Contraction Frequency (min): none ?Resting Tone Palpated: Relaxed ? ?Interpretation (Fetal Testing) ?Nonstress Test Interpretation: Reactive ?Overall Impression: Reassuring for gestational age ?Comments: tracing reviewed byDr. Grace Bushy ? ? ?

## 2021-09-10 ENCOUNTER — Ambulatory Visit (HOSPITAL_BASED_OUTPATIENT_CLINIC_OR_DEPARTMENT_OTHER): Payer: Medicaid Other | Admitting: *Deleted

## 2021-09-10 ENCOUNTER — Ambulatory Visit: Payer: Medicaid Other | Attending: Maternal & Fetal Medicine

## 2021-09-10 ENCOUNTER — Encounter: Payer: Self-pay | Admitting: *Deleted

## 2021-09-10 ENCOUNTER — Ambulatory Visit: Payer: Medicaid Other | Admitting: *Deleted

## 2021-09-10 VITALS — BP 115/65 | HR 80

## 2021-09-10 DIAGNOSIS — O99213 Obesity complicating pregnancy, third trimester: Secondary | ICD-10-CM

## 2021-09-10 DIAGNOSIS — O99513 Diseases of the respiratory system complicating pregnancy, third trimester: Secondary | ICD-10-CM | POA: Diagnosis not present

## 2021-09-10 DIAGNOSIS — Z3A28 28 weeks gestation of pregnancy: Secondary | ICD-10-CM | POA: Diagnosis not present

## 2021-09-10 DIAGNOSIS — J45909 Unspecified asthma, uncomplicated: Secondary | ICD-10-CM | POA: Diagnosis not present

## 2021-09-10 DIAGNOSIS — O99210 Obesity complicating pregnancy, unspecified trimester: Secondary | ICD-10-CM

## 2021-09-10 DIAGNOSIS — O365921 Maternal care for other known or suspected poor fetal growth, second trimester, fetus 1: Secondary | ICD-10-CM | POA: Insufficient documentation

## 2021-09-10 DIAGNOSIS — Z3483 Encounter for supervision of other normal pregnancy, third trimester: Secondary | ICD-10-CM | POA: Insufficient documentation

## 2021-09-10 DIAGNOSIS — O365930 Maternal care for other known or suspected poor fetal growth, third trimester, not applicable or unspecified: Secondary | ICD-10-CM

## 2021-09-10 DIAGNOSIS — O99212 Obesity complicating pregnancy, second trimester: Secondary | ICD-10-CM | POA: Insufficient documentation

## 2021-09-10 NOTE — Procedures (Signed)
Lori Lam ?Oct 18, 1995 ?[redacted]w[redacted]d ? ?Fetus A Non-Stress Test Interpretation for 09/10/21 ? ?Indication: IUGR ? ?Fetal Heart Rate A ?Mode: External ?Baseline Rate (A): 150 bpm ?Variability: Moderate ?Accelerations: 10 x 10 ?Decelerations: None ?Multiple birth?: No ? ?Uterine Activity ?Mode: Palpation, Toco ?Contraction Frequency (min): none ?Resting Tone Palpated: Relaxed ? ?Interpretation (Fetal Testing) ?Nonstress Test Interpretation: Reactive ?Overall Impression: Reassuring for gestational age ?Comments: Dr. Judeth Cornfield reviewed tracing ? ? ?

## 2021-09-14 ENCOUNTER — Ambulatory Visit: Payer: Medicaid Other

## 2021-09-14 ENCOUNTER — Ambulatory Visit: Payer: Medicaid Other | Admitting: *Deleted

## 2021-09-14 ENCOUNTER — Other Ambulatory Visit: Payer: Medicaid Other

## 2021-09-14 ENCOUNTER — Ambulatory Visit: Payer: Medicaid Other | Attending: Maternal & Fetal Medicine | Admitting: *Deleted

## 2021-09-14 VITALS — BP 113/62 | HR 82

## 2021-09-14 DIAGNOSIS — Z3A29 29 weeks gestation of pregnancy: Secondary | ICD-10-CM | POA: Insufficient documentation

## 2021-09-14 DIAGNOSIS — E669 Obesity, unspecified: Secondary | ICD-10-CM | POA: Diagnosis not present

## 2021-09-14 DIAGNOSIS — O365931 Maternal care for other known or suspected poor fetal growth, third trimester, fetus 1: Secondary | ICD-10-CM | POA: Insufficient documentation

## 2021-09-14 DIAGNOSIS — Z3483 Encounter for supervision of other normal pregnancy, third trimester: Secondary | ICD-10-CM

## 2021-09-14 DIAGNOSIS — O99213 Obesity complicating pregnancy, third trimester: Secondary | ICD-10-CM | POA: Diagnosis not present

## 2021-09-14 DIAGNOSIS — O99210 Obesity complicating pregnancy, unspecified trimester: Secondary | ICD-10-CM

## 2021-09-14 NOTE — Procedures (Signed)
Masiah A Derrington ?1996-03-08 ?[redacted]w[redacted]d ? ?Fetus A Non-Stress Test Interpretation for 09/14/21 ? ?Indication: IUGR ? ?Fetal Heart Rate A ?Mode: External ?Baseline Rate (A): 140 bpm ?Variability: Moderate ?Accelerations: 15 x 15 ?Decelerations: None ?Multiple birth?: No ? ?Uterine Activity ?Mode: Toco ?Contraction Frequency (min): none ?Resting Tone Palpated: Relaxed ? ?Interpretation (Fetal Testing) ?Nonstress Test Interpretation: Reactive ?Overall Impression: Reassuring for gestational age ?Comments: tracing reviewed by Dr. Judeth Cornfield ? ? ?

## 2021-09-17 ENCOUNTER — Ambulatory Visit: Payer: Medicaid Other | Attending: Maternal & Fetal Medicine

## 2021-09-17 ENCOUNTER — Encounter: Payer: Self-pay | Admitting: *Deleted

## 2021-09-17 ENCOUNTER — Ambulatory Visit: Payer: Medicaid Other | Admitting: *Deleted

## 2021-09-17 ENCOUNTER — Ambulatory Visit (HOSPITAL_BASED_OUTPATIENT_CLINIC_OR_DEPARTMENT_OTHER): Payer: Medicaid Other | Admitting: *Deleted

## 2021-09-17 VITALS — BP 112/59 | HR 79

## 2021-09-17 DIAGNOSIS — O365990 Maternal care for other known or suspected poor fetal growth, unspecified trimester, not applicable or unspecified: Secondary | ICD-10-CM | POA: Insufficient documentation

## 2021-09-17 DIAGNOSIS — O365930 Maternal care for other known or suspected poor fetal growth, third trimester, not applicable or unspecified: Secondary | ICD-10-CM | POA: Diagnosis not present

## 2021-09-17 DIAGNOSIS — O99212 Obesity complicating pregnancy, second trimester: Secondary | ICD-10-CM

## 2021-09-17 DIAGNOSIS — Z3A29 29 weeks gestation of pregnancy: Secondary | ICD-10-CM

## 2021-09-17 DIAGNOSIS — O99213 Obesity complicating pregnancy, third trimester: Secondary | ICD-10-CM | POA: Diagnosis not present

## 2021-09-17 DIAGNOSIS — J45909 Unspecified asthma, uncomplicated: Secondary | ICD-10-CM | POA: Diagnosis not present

## 2021-09-17 DIAGNOSIS — O99513 Diseases of the respiratory system complicating pregnancy, third trimester: Secondary | ICD-10-CM

## 2021-09-17 DIAGNOSIS — O365921 Maternal care for other known or suspected poor fetal growth, second trimester, fetus 1: Secondary | ICD-10-CM | POA: Insufficient documentation

## 2021-09-17 NOTE — Procedures (Signed)
Lori Lam 05-03-1995 [redacted]w[redacted]d  Fetus A Non-Stress Test Interpretation for 09/17/21  Indication:  FGR  Fetal Heart Rate A Mode: External Baseline Rate (A): 140 bpm Variability: Moderate Accelerations: 15 x 15 Decelerations: None Multiple birth?: No  Uterine Activity Mode: Palpation, Toco Contraction Frequency (min): none Resting Tone Palpated: Relaxed  Interpretation (Fetal Testing) Nonstress Test Interpretation: Reactive Overall Impression: Reassuring for gestational age Comments: Dr. Grace Bushy reviewed tracing

## 2021-09-21 ENCOUNTER — Ambulatory Visit: Payer: Medicaid Other | Admitting: *Deleted

## 2021-09-21 ENCOUNTER — Other Ambulatory Visit: Payer: Medicaid Other

## 2021-09-21 ENCOUNTER — Encounter: Payer: Self-pay | Admitting: *Deleted

## 2021-09-21 ENCOUNTER — Ambulatory Visit: Payer: Medicaid Other | Attending: Maternal & Fetal Medicine | Admitting: *Deleted

## 2021-09-21 ENCOUNTER — Ambulatory Visit: Payer: Medicaid Other

## 2021-09-21 VITALS — BP 111/64 | HR 73

## 2021-09-21 DIAGNOSIS — Z3A30 30 weeks gestation of pregnancy: Secondary | ICD-10-CM | POA: Diagnosis not present

## 2021-09-21 DIAGNOSIS — O99213 Obesity complicating pregnancy, third trimester: Secondary | ICD-10-CM | POA: Diagnosis not present

## 2021-09-21 DIAGNOSIS — E669 Obesity, unspecified: Secondary | ICD-10-CM | POA: Diagnosis not present

## 2021-09-21 DIAGNOSIS — O365931 Maternal care for other known or suspected poor fetal growth, third trimester, fetus 1: Secondary | ICD-10-CM | POA: Insufficient documentation

## 2021-09-21 DIAGNOSIS — Z3483 Encounter for supervision of other normal pregnancy, third trimester: Secondary | ICD-10-CM

## 2021-09-21 DIAGNOSIS — O99210 Obesity complicating pregnancy, unspecified trimester: Secondary | ICD-10-CM

## 2021-09-21 NOTE — Progress Notes (Signed)
O15

## 2021-09-21 NOTE — Procedures (Signed)
Lori Lam August 21, 1995 [redacted]w[redacted]d  Fetus A Non-Stress Test Interpretation for 09/21/21  Indication: IUGR  Fetal Heart Rate A Mode: External Baseline Rate (A): 140 bpm Variability: Moderate Accelerations: 15 x 15 Multiple birth?: No  Uterine Activity Mode: Toco Contraction Frequency (min): none Resting Tone Palpated: Relaxed  Interpretation (Fetal Testing) Nonstress Test Interpretation: Reactive Overall Impression: Reassuring for gestational age Comments: tracing reviewed by Dr. Parke Poisson

## 2021-09-23 ENCOUNTER — Encounter: Payer: Self-pay | Admitting: Family Medicine

## 2021-09-23 ENCOUNTER — Ambulatory Visit: Payer: Medicaid Other | Admitting: *Deleted

## 2021-09-23 ENCOUNTER — Ambulatory Visit: Payer: Medicaid Other | Attending: Maternal & Fetal Medicine

## 2021-09-23 ENCOUNTER — Other Ambulatory Visit: Payer: Medicaid Other

## 2021-09-23 ENCOUNTER — Ambulatory Visit (INDEPENDENT_AMBULATORY_CARE_PROVIDER_SITE_OTHER): Payer: Medicaid Other | Admitting: Family Medicine

## 2021-09-23 ENCOUNTER — Encounter: Payer: Self-pay | Admitting: *Deleted

## 2021-09-23 ENCOUNTER — Ambulatory Visit (HOSPITAL_BASED_OUTPATIENT_CLINIC_OR_DEPARTMENT_OTHER): Payer: Medicaid Other | Admitting: *Deleted

## 2021-09-23 VITALS — BP 120/66 | HR 76

## 2021-09-23 VITALS — BP 109/67 | HR 74 | Wt 213.8 lb

## 2021-09-23 DIAGNOSIS — O365931 Maternal care for other known or suspected poor fetal growth, third trimester, fetus 1: Secondary | ICD-10-CM

## 2021-09-23 DIAGNOSIS — Z3A30 30 weeks gestation of pregnancy: Secondary | ICD-10-CM

## 2021-09-23 DIAGNOSIS — O365921 Maternal care for other known or suspected poor fetal growth, second trimester, fetus 1: Secondary | ICD-10-CM | POA: Diagnosis present

## 2021-09-23 DIAGNOSIS — O99212 Obesity complicating pregnancy, second trimester: Secondary | ICD-10-CM | POA: Diagnosis present

## 2021-09-23 DIAGNOSIS — O99513 Diseases of the respiratory system complicating pregnancy, third trimester: Secondary | ICD-10-CM | POA: Diagnosis not present

## 2021-09-23 DIAGNOSIS — E669 Obesity, unspecified: Secondary | ICD-10-CM

## 2021-09-23 DIAGNOSIS — O365930 Maternal care for other known or suspected poor fetal growth, third trimester, not applicable or unspecified: Secondary | ICD-10-CM | POA: Insufficient documentation

## 2021-09-23 DIAGNOSIS — O99213 Obesity complicating pregnancy, third trimester: Secondary | ICD-10-CM

## 2021-09-23 DIAGNOSIS — Z3400 Encounter for supervision of normal first pregnancy, unspecified trimester: Secondary | ICD-10-CM

## 2021-09-23 DIAGNOSIS — O99210 Obesity complicating pregnancy, unspecified trimester: Secondary | ICD-10-CM

## 2021-09-23 DIAGNOSIS — J45909 Unspecified asthma, uncomplicated: Secondary | ICD-10-CM

## 2021-09-23 DIAGNOSIS — O365990 Maternal care for other known or suspected poor fetal growth, unspecified trimester, not applicable or unspecified: Secondary | ICD-10-CM

## 2021-09-23 NOTE — Procedures (Signed)
DUSTEE MAUTNER 11-08-1995 [redacted]w[redacted]d  Fetus A Non-Stress Test Interpretation for 09/23/21  Indication: IUGR  Fetal Heart Rate A Mode: External Baseline Rate (A): 135 bpm Variability: Moderate Accelerations: 10 x 10, 15 x 15 Decelerations: None Multiple birth?: No  Uterine Activity Mode: Toco Contraction Frequency (min): none Resting Tone Palpated: Relaxed  Interpretation (Fetal Testing) Nonstress Test Interpretation: Reactive Overall Impression: Reassuring for gestational age Comments: tracing reviewed by Dr. Annamaria Boots

## 2021-09-23 NOTE — Progress Notes (Signed)
    Subjective:  Lori Lam is a 26 y.o. [phone] at [redacted]w[redacted]d being seen today for ongoing prenatal care.  She is currently monitored for the following issues for this high-risk pregnancy and has H/O chlamydia infection; Asthma; Depression; Marijuana use; Mild concussion; Supervision of normal pregnancy; Maternal obesity affecting pregnancy, antepartum; and IUGR (intrauterine growth restriction) affecting care of mother on their problem list.  Patient reports no complaints. Had MFM follow up US today, reports fetal growth to the 9%tile. She is wondering if she will need a primary C/S and is considering doing this regardless because of how small baby is. She is scared the cord will get wrapped the neck cause that happen to her friend in delivery. Contractions: Irritability. Vag. Bleeding: None.  Movement: Present. Denies leaking of fluid.   The following portions of the patient's history were reviewed and updated as appropriate: allergies, current medications, past family history, past medical history, past social history, past surgical history and problem list.   Objective:   Vitals:   09/23/21 0955  BP: 109/67  Pulse: 74  Weight: 213 lb 12.8 oz (97 kg)    Fetal Status: Fetal Heart Rate (bpm): 142 Fundal Height: 28 cm Movement: Present  Presentation: Vertex  General:  Alert, oriented and cooperative. Patient is in no acute distress.  Skin: Skin is warm and dry. No rash noted.   Cardiovascular: Normal heart rate noted  Respiratory: Normal respiratory effort, no problems with respiration noted  Abdomen: Soft, gravid, appropriate for gestational age. Pain/Pressure: Absent     Pelvic:  Cervical exam deferred        Extremities: Normal range of motion.  Edema: None  Mental Status: Normal mood and affect. Normal behavior. Normal judgment and thought content.   Urinalysis:      Assessment and Plan:  Pregnancy: [id number] at [redacted]w[redacted]d  1. Supervision of normal first pregnancy,  antepartum Doing well. Strongly recommended attempting vaginal delivery if still clinically indicated, especially as she has had two previous uncomplicated vaginal deliveries. Discussed that it is possible that FGR infants may not tolerate labor, but often times they do. Provided comfort that while the cord getting wrapped around places can happen, this does not always correlate with issue in labor.   2. [redacted] weeks gestation of pregnancy Normal third trimester labs.   3. Fetal growth restriction antepartum 5/5 [place] 1.8%, now 9% per patient report from [place] this am. Elevated UA dopplers previously without AEDF/REDF.   4. Maternal obesity affecting pregnancy, antepartum TWG 8 lbs which is appropriate. BMI currently 34.   Preterm labor symptoms and general obstetric precautions including but not limited to vaginal bleeding, contractions, leaking of fluid and fetal movement were reviewed in detail with the patient. Please refer to After Visit Summary for other counseling recommendations.   Return in about 2 weeks (around 10/07/2021) for HROB.   Patriciaann Clan, DO

## 2021-09-28 ENCOUNTER — Ambulatory Visit: Payer: Medicaid Other | Attending: Maternal & Fetal Medicine | Admitting: *Deleted

## 2021-09-28 ENCOUNTER — Ambulatory Visit: Payer: Medicaid Other | Admitting: *Deleted

## 2021-09-28 ENCOUNTER — Encounter: Payer: Self-pay | Admitting: *Deleted

## 2021-09-28 ENCOUNTER — Ambulatory Visit: Payer: Medicaid Other

## 2021-09-28 ENCOUNTER — Other Ambulatory Visit: Payer: Medicaid Other

## 2021-09-28 VITALS — BP 105/60 | HR 77

## 2021-09-28 DIAGNOSIS — O99210 Obesity complicating pregnancy, unspecified trimester: Secondary | ICD-10-CM

## 2021-09-28 DIAGNOSIS — Z3400 Encounter for supervision of normal first pregnancy, unspecified trimester: Secondary | ICD-10-CM

## 2021-09-28 DIAGNOSIS — O365990 Maternal care for other known or suspected poor fetal growth, unspecified trimester, not applicable or unspecified: Secondary | ICD-10-CM | POA: Diagnosis not present

## 2021-09-28 DIAGNOSIS — O99213 Obesity complicating pregnancy, third trimester: Secondary | ICD-10-CM | POA: Insufficient documentation

## 2021-09-28 DIAGNOSIS — Z3A31 31 weeks gestation of pregnancy: Secondary | ICD-10-CM | POA: Insufficient documentation

## 2021-09-28 NOTE — Procedures (Signed)
WINRY EGNEW 05/08/1995 [redacted]w[redacted]d  Fetus A Non-Stress Test Interpretation for 09/28/21  Indication: IUGR  Fetal Heart Rate A Mode: External Baseline Rate (A): 135 bpm Variability: Moderate Accelerations: 15 x 15 Multiple birth?: No  Uterine Activity Mode: Toco Contraction Frequency (min): none Resting Tone Palpated: Relaxed  Interpretation (Fetal Testing) Nonstress Test Interpretation: Reactive Overall Impression: Reassuring for gestational age Comments: tracing reviewed by Dr. Judeth Cornfield

## 2021-10-01 ENCOUNTER — Other Ambulatory Visit: Payer: Self-pay | Admitting: Maternal & Fetal Medicine

## 2021-10-01 ENCOUNTER — Ambulatory Visit (HOSPITAL_BASED_OUTPATIENT_CLINIC_OR_DEPARTMENT_OTHER): Payer: Medicaid Other

## 2021-10-01 ENCOUNTER — Ambulatory Visit: Payer: Medicaid Other | Admitting: *Deleted

## 2021-10-01 ENCOUNTER — Other Ambulatory Visit: Payer: Self-pay | Admitting: *Deleted

## 2021-10-01 ENCOUNTER — Ambulatory Visit: Payer: Medicaid Other

## 2021-10-01 ENCOUNTER — Ambulatory Visit: Payer: Medicaid Other | Attending: Maternal & Fetal Medicine | Admitting: *Deleted

## 2021-10-01 ENCOUNTER — Other Ambulatory Visit: Payer: Medicaid Other

## 2021-10-01 VITALS — BP 111/61 | HR 78

## 2021-10-01 DIAGNOSIS — O365930 Maternal care for other known or suspected poor fetal growth, third trimester, not applicable or unspecified: Secondary | ICD-10-CM | POA: Diagnosis present

## 2021-10-01 DIAGNOSIS — O365921 Maternal care for other known or suspected poor fetal growth, second trimester, fetus 1: Secondary | ICD-10-CM

## 2021-10-01 DIAGNOSIS — O99213 Obesity complicating pregnancy, third trimester: Secondary | ICD-10-CM

## 2021-10-01 DIAGNOSIS — Z3A31 31 weeks gestation of pregnancy: Secondary | ICD-10-CM

## 2021-10-01 DIAGNOSIS — E669 Obesity, unspecified: Secondary | ICD-10-CM

## 2021-10-01 DIAGNOSIS — O99513 Diseases of the respiratory system complicating pregnancy, third trimester: Secondary | ICD-10-CM | POA: Insufficient documentation

## 2021-10-01 DIAGNOSIS — J45909 Unspecified asthma, uncomplicated: Secondary | ICD-10-CM

## 2021-10-01 DIAGNOSIS — O365931 Maternal care for other known or suspected poor fetal growth, third trimester, fetus 1: Secondary | ICD-10-CM

## 2021-10-01 DIAGNOSIS — O35EXX0 Maternal care for other (suspected) fetal abnormality and damage, fetal genitourinary anomalies, not applicable or unspecified: Secondary | ICD-10-CM

## 2021-10-01 DIAGNOSIS — O99212 Obesity complicating pregnancy, second trimester: Secondary | ICD-10-CM

## 2021-10-05 ENCOUNTER — Other Ambulatory Visit: Payer: Medicaid Other

## 2021-10-05 ENCOUNTER — Ambulatory Visit: Payer: Medicaid Other

## 2021-10-07 ENCOUNTER — Ambulatory Visit (INDEPENDENT_AMBULATORY_CARE_PROVIDER_SITE_OTHER): Payer: Medicaid Other | Admitting: Obstetrics & Gynecology

## 2021-10-07 ENCOUNTER — Encounter: Payer: Self-pay | Admitting: Obstetrics & Gynecology

## 2021-10-07 VITALS — BP 123/77 | HR 97 | Wt 216.0 lb

## 2021-10-07 DIAGNOSIS — O365930 Maternal care for other known or suspected poor fetal growth, third trimester, not applicable or unspecified: Secondary | ICD-10-CM

## 2021-10-07 DIAGNOSIS — Z3400 Encounter for supervision of normal first pregnancy, unspecified trimester: Secondary | ICD-10-CM

## 2021-10-07 NOTE — Progress Notes (Signed)
   PRENATAL VISIT NOTE  Subjective:  Lori Lam is a 26 y.o. [phone] at [redacted]w[redacted]d being seen today for ongoing prenatal care.  She is currently monitored for the following issues for this high-risk pregnancy and has H/O chlamydia infection; Asthma; Depression; Marijuana use; Mild concussion; Supervision of normal pregnancy; Maternal obesity affecting pregnancy, antepartum; and IUGR (intrauterine growth restriction) affecting care of mother on their problem list.  Patient reports no complaints. Endorses good fetal movement. Contractions: Irritability. Vag. Bleeding: None. Movement: Present. Denies leaking of fluid.   The following portions of the patient's history were reviewed and updated as appropriate: allergies, current medications, past family history, past medical history, past social history, past surgical history and problem list.   Objective:   Vitals:   10/07/21 1037  BP: 123/77  Pulse: 97  Weight: 216 lb (98 kg)    Fetal Status: Fetal Heart Rate (bpm): 142   Movement: Present    General:  Alert, oriented and cooperative. Patient is in no acute distress.  Skin: Skin is warm and dry. No rash noted.   Cardiovascular: Normal heart rate noted  Respiratory: Normal respiratory effort, no problems with respiration noted  Abdomen: Soft, gravid, appropriate for gestational age.  Pain/Pressure: Present     Pelvic: Cervical exam deferred        Extremities: Normal range of motion.  Edema: Trace  Mental Status: Normal mood and affect. Normal behavior. Normal judgment and thought content.   Assessment and Plan:  Pregnancy: [id number] at [redacted]w[redacted]d  1. Supervision of normal first pregnancy, antepartum -Continue with scheduled appointments  2. Poor fetal growth affecting management of mother in third trimester, single or unspecified fetus -BPP tomorrow   Preterm labor symptoms and general obstetric precautions including but not limited to vaginal bleeding, contractions, leaking of fluid  and fetal movement were reviewed in detail with the patient. Please refer to After Visit Summary for other counseling recommendations.   Return in about 1 week (around 10/14/2021).  Future Appointments  Date Time Provider Department Center  10/08/2021  7:30 AM [facility] NURSE [facility]  10/08/2021  7:45 AM [facility] US5 [facility]  10/08/2021  8:45 AM [facility] NST [facility]  10/12/2021  8:30 AM [facility] NURSE [facility]  10/12/2021  8:45 AM [facility] NST [facility]  10/19/2021  7:15 AM [facility] NURSE [facility]  10/19/2021  7:30 AM [facility] US3 [facility]  10/19/2021  8:45 AM [facility] NST [facility]  10/26/2021  7:30 AM [facility] NURSE [facility]  10/26/2021  7:45 AM [facility] US5 [facility]  10/26/2021  8:45 AM [facility] NST [facility]  11/01/2021  7:30 AM [facility] NURSE [facility]  11/01/2021  7:45 AM [facility] US5 [facility]  11/01/2021  8:45 AM [facility] NST [facility]  11/01/2021  1:35 PM Reva Bores, MD [facility]  11/08/2021  3:55 PM [place] Bing, MD [facility]  11/15/2021  2:35 PM Warden Fillers, MD [facility]  11/22/2021  2:15 PM Warden Fillers, MD [facility]  11/29/2021  3:55 PM Milas Hock, MD [facility]    [place], Student-PA 10/07/21 10:52 AM

## 2021-10-07 NOTE — Progress Notes (Deleted)
  Subjective:    Lori Lam is a [id number] [redacted]w[redacted]d being seen today for her first obstetrical visit.  Her obstetrical history is significant for {ob risk factors:10154}. Patient {does/does not:19097} intend to breast feed. Pregnancy history fully reviewed.  Patient reports {sx:14538}.  Vitals:   10/07/21 1037  BP: 123/77  Pulse: 97  Weight: 216 lb (98 kg)    HISTORY: OB History  Gravida Para Term Preterm AB Living  4 2 2  0 1 2  SAB IAB Ectopic Multiple Live Births  0 1 0 0 2    # Outcome Date GA Lbr Len/2nd Weight Sex Delivery Anes PTL Lv  4 Current           3 Term 09/25/16 [redacted]w[redacted]d 17:05 / 00:17 7 lb 11.3 oz (3.495 kg) M Vag-Spont EPI  LIV  2 Term 12/27/12 [redacted]w[redacted]d 15:49 / 00:37 6 lb 12.4 oz (3.073 kg) M Vag-Spont EPI  LIV  1 IAB            Past Medical History:  Diagnosis Date   Anemia    Anxiety    Asthma    Bacterial vaginitis    Chlamydia contact, treated    GERD (gastroesophageal reflux disease)    Past Surgical History:  Procedure Laterality Date   HERNIA REPAIR     Family History  Problem Relation Age of Onset   Hypertension Mother    Hypertension Father    Hypertension Maternal Grandmother    Hypertension Maternal Grandfather    Hypertension Paternal Grandmother    Hypertension Other    Diabetes Other      Exam    Uterus:     Pelvic Exam:    Perineum: {Exam; anus and perineum:14598}   Vulva: {vulva exam:14487}   Vagina:  {vaginal exam:14498}   pH: ***   Cervix: {cervix exam:14595}   Adnexa: {adnexa:12223}   Bony Pelvis: {desc; pelvis:14491}  System: Breast:  {Exam; breast:13139::"normal appearance, no masses or tenderness"}   Skin: {pe skin brief RP:[id number]    Neurologic: {Exam; neuro:16375}   Extremities: {Exam; extremities:15096}   HEENT {exam; BF:7684542   Mouth/Teeth {pe mouth simple ob:314450}   Neck {Neck (ob):32092}   Cardiovascular: {HEART EXAM HEM/ONC:21750}   Respiratory:  {Exam; respiratory:5782}   Abdomen: {Exam;  abdomen:16834}   Urinary: {exam; urinary female:30845}      Assessment:    Pregnancy: [id number] Patient Active Problem List   Diagnosis Date Noted   IUGR (intrauterine growth restriction) affecting care of mother 09/06/2021   Maternal obesity affecting pregnancy, antepartum 08/16/2021   Supervision of normal pregnancy 04/27/2021   Mild concussion 12/21/2017   H/O chlamydia infection 12/28/2012   Asthma 12/28/2012   Depression 12/28/2012   Marijuana use 12/28/2012        Plan:     Initial labs drawn. Prenatal vitamins. Problem list reviewed and updated. Genetic Screening discussed {GENETIC SCREENING TEST:22046}: {requests/ordered/declines:14581}.  Ultrasound discussed; fetal survey: {requests/ordered/declines:14581}.  Follow up in {numbers 0-4:31231} weeks. ***% of *** min visit spent on counseling and coordination of care.  ***   Emeterio Reeve 10/07/2021

## 2021-10-08 ENCOUNTER — Ambulatory Visit: Payer: Medicaid Other

## 2021-10-08 ENCOUNTER — Encounter: Payer: Self-pay | Admitting: *Deleted

## 2021-10-08 ENCOUNTER — Other Ambulatory Visit: Payer: Medicaid Other

## 2021-10-08 ENCOUNTER — Other Ambulatory Visit: Payer: Self-pay | Admitting: Maternal & Fetal Medicine

## 2021-10-08 ENCOUNTER — Other Ambulatory Visit: Payer: Self-pay | Admitting: *Deleted

## 2021-10-08 ENCOUNTER — Ambulatory Visit: Payer: Medicaid Other | Attending: Maternal & Fetal Medicine | Admitting: *Deleted

## 2021-10-08 ENCOUNTER — Ambulatory Visit (HOSPITAL_BASED_OUTPATIENT_CLINIC_OR_DEPARTMENT_OTHER): Payer: Medicaid Other

## 2021-10-08 ENCOUNTER — Ambulatory Visit: Payer: Medicaid Other | Admitting: *Deleted

## 2021-10-08 VITALS — BP 109/66 | HR 84

## 2021-10-08 DIAGNOSIS — O99213 Obesity complicating pregnancy, third trimester: Secondary | ICD-10-CM | POA: Diagnosis present

## 2021-10-08 DIAGNOSIS — O365920 Maternal care for other known or suspected poor fetal growth, second trimester, not applicable or unspecified: Secondary | ICD-10-CM | POA: Diagnosis not present

## 2021-10-08 DIAGNOSIS — O365931 Maternal care for other known or suspected poor fetal growth, third trimester, fetus 1: Secondary | ICD-10-CM

## 2021-10-08 DIAGNOSIS — O365990 Maternal care for other known or suspected poor fetal growth, unspecified trimester, not applicable or unspecified: Secondary | ICD-10-CM

## 2021-10-08 DIAGNOSIS — Z3A32 32 weeks gestation of pregnancy: Secondary | ICD-10-CM | POA: Insufficient documentation

## 2021-10-08 DIAGNOSIS — J45909 Unspecified asthma, uncomplicated: Secondary | ICD-10-CM | POA: Insufficient documentation

## 2021-10-08 DIAGNOSIS — O99513 Diseases of the respiratory system complicating pregnancy, third trimester: Secondary | ICD-10-CM

## 2021-10-08 DIAGNOSIS — O365921 Maternal care for other known or suspected poor fetal growth, second trimester, fetus 1: Secondary | ICD-10-CM

## 2021-10-08 DIAGNOSIS — O99891 Other specified diseases and conditions complicating pregnancy: Secondary | ICD-10-CM | POA: Insufficient documentation

## 2021-10-08 DIAGNOSIS — O99212 Obesity complicating pregnancy, second trimester: Secondary | ICD-10-CM | POA: Diagnosis not present

## 2021-10-08 DIAGNOSIS — O365930 Maternal care for other known or suspected poor fetal growth, third trimester, not applicable or unspecified: Secondary | ICD-10-CM

## 2021-10-08 DIAGNOSIS — E669 Obesity, unspecified: Secondary | ICD-10-CM

## 2021-10-08 NOTE — Procedures (Signed)
Lori Lam [birth year] [redacted]w[redacted]d  Fetus A Non-Stress Test Interpretation for 10/08/21  Indication:  FGR  Fetal Heart Rate A Mode: External Baseline Rate (A): 135 bpm Variability: Minimal, Moderate Accelerations: 15 x 15 Decelerations: Variable Multiple birth?: No  Uterine Activity Mode: Toco Contraction Frequency (min): none Resting Tone Palpated: Relaxed  Interpretation (Fetal Testing) Nonstress Test Interpretation: Reactive Overall Impression: Reassuring for gestational age Comments: tracing reviewed by Dr. Judeth Cornfield

## 2021-10-08 NOTE — Progress Notes (Signed)
Patient ID: Lori Lam, female   DOB: Jul 03, 1995, 26 y.o.   MRN: [id number] Attestation of Attending Supervision of PA Student: Evaluation and management procedures were performed by the PA student under my supervision and collaboration.  I have reviewed the student's note and chart, and I agree with the management and plan.  Scheryl Darter, MD, FACOG Attending Obstetrician & Gynecologist Faculty Practice, [facility]

## 2021-10-12 ENCOUNTER — Ambulatory Visit: Payer: Medicaid Other

## 2021-10-12 ENCOUNTER — Other Ambulatory Visit: Payer: Medicaid Other

## 2021-10-15 ENCOUNTER — Ambulatory Visit (HOSPITAL_BASED_OUTPATIENT_CLINIC_OR_DEPARTMENT_OTHER): Payer: Medicaid Other | Admitting: *Deleted

## 2021-10-15 ENCOUNTER — Other Ambulatory Visit: Payer: Self-pay | Admitting: Obstetrics and Gynecology

## 2021-10-15 ENCOUNTER — Ambulatory Visit: Payer: Medicaid Other | Admitting: *Deleted

## 2021-10-15 ENCOUNTER — Encounter: Payer: Self-pay | Admitting: *Deleted

## 2021-10-15 ENCOUNTER — Ambulatory Visit: Payer: Medicaid Other | Attending: Obstetrics and Gynecology

## 2021-10-15 VITALS — BP 108/63 | HR 84

## 2021-10-15 DIAGNOSIS — O365990 Maternal care for other known or suspected poor fetal growth, unspecified trimester, not applicable or unspecified: Secondary | ICD-10-CM

## 2021-10-15 DIAGNOSIS — O99213 Obesity complicating pregnancy, third trimester: Secondary | ICD-10-CM | POA: Insufficient documentation

## 2021-10-15 DIAGNOSIS — O35EXX0 Maternal care for other (suspected) fetal abnormality and damage, fetal genitourinary anomalies, not applicable or unspecified: Secondary | ICD-10-CM | POA: Insufficient documentation

## 2021-10-15 DIAGNOSIS — E669 Obesity, unspecified: Secondary | ICD-10-CM

## 2021-10-15 DIAGNOSIS — O99210 Obesity complicating pregnancy, unspecified trimester: Secondary | ICD-10-CM | POA: Insufficient documentation

## 2021-10-15 DIAGNOSIS — J45909 Unspecified asthma, uncomplicated: Secondary | ICD-10-CM | POA: Diagnosis not present

## 2021-10-15 DIAGNOSIS — Z3400 Encounter for supervision of normal first pregnancy, unspecified trimester: Secondary | ICD-10-CM

## 2021-10-15 DIAGNOSIS — O365930 Maternal care for other known or suspected poor fetal growth, third trimester, not applicable or unspecified: Secondary | ICD-10-CM | POA: Diagnosis present

## 2021-10-15 DIAGNOSIS — Z3A33 33 weeks gestation of pregnancy: Secondary | ICD-10-CM | POA: Diagnosis not present

## 2021-10-15 DIAGNOSIS — O99513 Diseases of the respiratory system complicating pregnancy, third trimester: Secondary | ICD-10-CM

## 2021-10-15 DIAGNOSIS — O99891 Other specified diseases and conditions complicating pregnancy: Secondary | ICD-10-CM | POA: Insufficient documentation

## 2021-10-15 NOTE — Procedures (Signed)
Lori Lam 05-09-1995 [redacted]w[redacted]d  Fetus A Non-Stress Test Interpretation for 10/15/21  Indication: IUGR  Fetal Heart Rate A Mode: External Baseline Rate (A): 135 bpm Variability: Moderate Accelerations: 15 x 15 Decelerations: None Multiple birth?: No  Uterine Activity Mode: Palpation, Toco Contraction Frequency (min): none Resting Tone Palpated: Relaxed  Interpretation (Fetal Testing) Nonstress Test Interpretation: Reactive Overall Impression: Reassuring for gestational age Comments: Dr. Grace Bushy reviewed tracing

## 2021-10-18 ENCOUNTER — Other Ambulatory Visit: Payer: Self-pay | Admitting: Obstetrics and Gynecology

## 2021-10-18 DIAGNOSIS — O365990 Maternal care for other known or suspected poor fetal growth, unspecified trimester, not applicable or unspecified: Secondary | ICD-10-CM

## 2021-10-19 ENCOUNTER — Ambulatory Visit: Payer: Medicaid Other | Attending: Obstetrics and Gynecology

## 2021-10-19 ENCOUNTER — Ambulatory Visit: Payer: Medicaid Other | Admitting: *Deleted

## 2021-10-19 ENCOUNTER — Encounter: Payer: Self-pay | Admitting: *Deleted

## 2021-10-19 VITALS — BP 119/58 | HR 71

## 2021-10-19 DIAGNOSIS — O99513 Diseases of the respiratory system complicating pregnancy, third trimester: Secondary | ICD-10-CM

## 2021-10-19 DIAGNOSIS — O99210 Obesity complicating pregnancy, unspecified trimester: Secondary | ICD-10-CM

## 2021-10-19 DIAGNOSIS — O365930 Maternal care for other known or suspected poor fetal growth, third trimester, not applicable or unspecified: Secondary | ICD-10-CM | POA: Insufficient documentation

## 2021-10-19 DIAGNOSIS — O365931 Maternal care for other known or suspected poor fetal growth, third trimester, fetus 1: Secondary | ICD-10-CM | POA: Insufficient documentation

## 2021-10-19 DIAGNOSIS — E669 Obesity, unspecified: Secondary | ICD-10-CM | POA: Diagnosis not present

## 2021-10-19 DIAGNOSIS — O35EXX0 Maternal care for other (suspected) fetal abnormality and damage, fetal genitourinary anomalies, not applicable or unspecified: Secondary | ICD-10-CM | POA: Diagnosis not present

## 2021-10-19 DIAGNOSIS — O99213 Obesity complicating pregnancy, third trimester: Secondary | ICD-10-CM

## 2021-10-19 DIAGNOSIS — Z3A34 34 weeks gestation of pregnancy: Secondary | ICD-10-CM

## 2021-10-19 DIAGNOSIS — J45909 Unspecified asthma, uncomplicated: Secondary | ICD-10-CM

## 2021-10-19 NOTE — Procedures (Signed)
MASSA PE 09-20-1995 [redacted]w[redacted]d  Fetus A Non-Stress Test Interpretation for 10/19/21  Indication: IUGR  Fetal Heart Rate A Mode: External Baseline Rate (A): 135 bpm Variability: Moderate Accelerations: 15 x 15 Decelerations: None Multiple birth?: No  Uterine Activity Mode: Toco Contraction Frequency (min): none Resting Tone Palpated: Relaxed  Interpretation (Fetal Testing) Nonstress Test Interpretation: Reactive Overall Impression: Reassuring for gestational age Comments: tracing reviewed by Dr. Parke Poisson

## 2021-10-21 ENCOUNTER — Ambulatory Visit (INDEPENDENT_AMBULATORY_CARE_PROVIDER_SITE_OTHER): Payer: Medicaid Other | Admitting: Family Medicine

## 2021-10-21 ENCOUNTER — Other Ambulatory Visit: Payer: Self-pay

## 2021-10-21 VITALS — BP 114/69 | HR 88 | Wt 224.0 lb

## 2021-10-21 DIAGNOSIS — O365930 Maternal care for other known or suspected poor fetal growth, third trimester, not applicable or unspecified: Secondary | ICD-10-CM

## 2021-10-21 DIAGNOSIS — Z3A34 34 weeks gestation of pregnancy: Secondary | ICD-10-CM

## 2021-10-21 DIAGNOSIS — Z3403 Encounter for supervision of normal first pregnancy, third trimester: Secondary | ICD-10-CM

## 2021-10-26 ENCOUNTER — Ambulatory Visit: Payer: Medicaid Other | Admitting: *Deleted

## 2021-10-26 ENCOUNTER — Encounter (HOSPITAL_COMMUNITY): Payer: Self-pay | Admitting: Family Medicine

## 2021-10-26 ENCOUNTER — Inpatient Hospital Stay (HOSPITAL_COMMUNITY)
Admission: AD | Admit: 2021-10-26 | Discharge: 2021-10-26 | Disposition: A | Discharge status: Home / Self Care | Payer: Medicaid Other | Attending: Family Medicine | Admitting: Family Medicine

## 2021-10-26 ENCOUNTER — Ambulatory Visit: Payer: Medicaid Other

## 2021-10-26 ENCOUNTER — Ambulatory Visit: Payer: Medicaid Other | Attending: Obstetrics

## 2021-10-26 ENCOUNTER — Encounter: Payer: Self-pay | Admitting: *Deleted

## 2021-10-26 VITALS — BP 87/46 | HR 71

## 2021-10-26 DIAGNOSIS — O365930 Maternal care for other known or suspected poor fetal growth, third trimester, not applicable or unspecified: Secondary | ICD-10-CM | POA: Diagnosis present

## 2021-10-26 DIAGNOSIS — O0993 Supervision of high risk pregnancy, unspecified, third trimester: Secondary | ICD-10-CM | POA: Diagnosis not present

## 2021-10-26 DIAGNOSIS — Z3403 Encounter for supervision of normal first pregnancy, third trimester: Secondary | ICD-10-CM

## 2021-10-26 DIAGNOSIS — O99210 Obesity complicating pregnancy, unspecified trimester: Secondary | ICD-10-CM

## 2021-10-26 DIAGNOSIS — E669 Obesity, unspecified: Secondary | ICD-10-CM | POA: Diagnosis not present

## 2021-10-26 DIAGNOSIS — Z3A35 35 weeks gestation of pregnancy: Secondary | ICD-10-CM

## 2021-10-26 DIAGNOSIS — O99513 Diseases of the respiratory system complicating pregnancy, third trimester: Secondary | ICD-10-CM

## 2021-10-26 DIAGNOSIS — O35EXX0 Maternal care for other (suspected) fetal abnormality and damage, fetal genitourinary anomalies, not applicable or unspecified: Secondary | ICD-10-CM

## 2021-10-26 DIAGNOSIS — O99213 Obesity complicating pregnancy, third trimester: Secondary | ICD-10-CM | POA: Diagnosis not present

## 2021-10-26 DIAGNOSIS — O4103X0 Oligohydramnios, third trimester, not applicable or unspecified: Secondary | ICD-10-CM | POA: Diagnosis not present

## 2021-10-26 DIAGNOSIS — R197 Diarrhea, unspecified: Secondary | ICD-10-CM | POA: Diagnosis not present

## 2021-10-26 DIAGNOSIS — O212 Late vomiting of pregnancy: Secondary | ICD-10-CM | POA: Diagnosis present

## 2021-10-26 DIAGNOSIS — O26893 Other specified pregnancy related conditions, third trimester: Secondary | ICD-10-CM | POA: Diagnosis not present

## 2021-10-26 DIAGNOSIS — R112 Nausea with vomiting, unspecified: Secondary | ICD-10-CM | POA: Diagnosis not present

## 2021-10-26 DIAGNOSIS — J45909 Unspecified asthma, uncomplicated: Secondary | ICD-10-CM

## 2021-10-26 LAB — COMPREHENSIVE METABOLIC PANEL
ALT: 16 U/L (ref 0–44)
AST: 19 U/L (ref 15–41)
Albumin: 3.1 g/dL — ABNORMAL LOW (ref 3.5–5.0)
Alkaline Phosphatase: 109 U/L (ref 38–126)
Anion gap: 10 (ref 5–15)
BUN: 9 mg/dL (ref 6–20)
CO2: 20 mmol/L — ABNORMAL LOW (ref 22–32)
Calcium: 9.2 mg/dL (ref 8.9–10.3)
Chloride: 105 mmol/L (ref 98–111)
Creatinine, Ser: 0.69 mg/dL (ref 0.44–1.00)
GFR, Estimated: >60 mL/min (ref >60)
Glucose, Bld: 82 mg/dL (ref 70–99)
Potassium: 4.1 mmol/L (ref 3.5–5.1)
Sodium: 135 mmol/L (ref 135–145)
Total Bilirubin: 0.3 mg/dL (ref 0.3–1.2)
Total Protein: 7.3 g/dL (ref 6.5–8.1)

## 2021-10-26 LAB — URINALYSIS, ROUTINE W REFLEX MICROSCOPIC
Bilirubin Urine: NEGATIVE
Glucose, UA: NEGATIVE mg/dL
Hgb urine dipstick: NEGATIVE
Ketones, ur: NEGATIVE mg/dL
Nitrite: NEGATIVE
Protein, ur: NEGATIVE mg/dL
Specific Gravity, Urine: 1.017 (ref 1.005–1.030)
pH: 7 (ref 5.0–8.0)

## 2021-10-26 MED ORDER — SODIUM CHLORIDE 0.9 % IV SOLN
8.0000 mg | Freq: Once | INTRAVENOUS | Status: AC
  Administered 2021-10-26: 8 mg via INTRAVENOUS
  Filled 2021-10-26: qty 4

## 2021-10-26 MED ORDER — LACTATED RINGERS IV BOLUS
1000.0000 mL | Freq: Once | INTRAVENOUS | Status: AC
  Administered 2021-10-26: 1000 mL via INTRAVENOUS

## 2021-10-27 ENCOUNTER — Inpatient Hospital Stay (HOSPITAL_COMMUNITY)
Admission: AD | Admit: 2021-10-27 | Discharge: 2021-10-27 | Disposition: A | Discharge status: Home / Self Care | Payer: Medicaid Other | Attending: Obstetrics and Gynecology | Admitting: Obstetrics and Gynecology

## 2021-10-27 ENCOUNTER — Encounter (HOSPITAL_COMMUNITY): Payer: Self-pay | Admitting: Obstetrics and Gynecology

## 2021-10-27 ENCOUNTER — Other Ambulatory Visit: Payer: Self-pay

## 2021-10-27 DIAGNOSIS — O26893 Other specified pregnancy related conditions, third trimester: Secondary | ICD-10-CM | POA: Diagnosis not present

## 2021-10-27 DIAGNOSIS — O212 Late vomiting of pregnancy: Secondary | ICD-10-CM | POA: Diagnosis present

## 2021-10-27 DIAGNOSIS — R112 Nausea with vomiting, unspecified: Secondary | ICD-10-CM | POA: Diagnosis not present

## 2021-10-27 DIAGNOSIS — E860 Dehydration: Secondary | ICD-10-CM | POA: Diagnosis not present

## 2021-10-27 DIAGNOSIS — Z3A35 35 weeks gestation of pregnancy: Secondary | ICD-10-CM | POA: Insufficient documentation

## 2021-10-27 LAB — URINALYSIS, ROUTINE W REFLEX MICROSCOPIC
Bacteria, UA: NONE SEEN
Bilirubin Urine: NEGATIVE
Glucose, UA: NEGATIVE mg/dL
Hgb urine dipstick: NEGATIVE
Ketones, ur: NEGATIVE mg/dL
Leukocytes,Ua: NEGATIVE
Nitrite: NEGATIVE
Protein, ur: 30 mg/dL — AB
Specific Gravity, Urine: 1.027 (ref 1.005–1.030)
pH: 7 (ref 5.0–8.0)

## 2021-10-27 MED ORDER — HYOSCYAMINE SULFATE SL 0.125 MG SL SUBL: 1.0000 | SUBLINGUAL_TABLET | SUBLINGUAL | 0 refills | Status: DC | PRN

## 2021-10-27 MED ORDER — ONDANSETRON HCL 40 MG/20ML IJ SOLN
8.0000 mg | Freq: Once | INTRAMUSCULAR | Status: AC
  Administered 2021-10-27: 8 mg via INTRAVENOUS
  Filled 2021-10-27: qty 4

## 2021-10-27 MED ORDER — HYOSCYAMINE SULFATE 0.125 MG SL SUBL
0.1250 mg | SUBLINGUAL_TABLET | Freq: Once | SUBLINGUAL | Status: AC
Start: 2021-10-27 — End: 2021-10-27
  Administered 2021-10-27: 0.125 mg via SUBLINGUAL
  Filled 2021-10-27: qty 1

## 2021-10-27 MED ORDER — DEXTROSE IN LACTATED RINGERS 5 % IV SOLN
Freq: Once | INTRAVENOUS | Status: AC
  Filled 2021-10-27: qty 10

## 2021-10-27 MED ORDER — ONDANSETRON HCL 8 MG PO TABS: 8.0000 mg | ORAL_TABLET | Freq: Three times a day (TID) | ORAL | 0 refills | Status: DC | PRN

## 2021-10-27 NOTE — MAU Note (Signed)
Lori Lam is a 26 y.o. at [redacted]w[redacted]d here in MAU reporting: was here in MAU yesterday for N/V was sent home and told to come back if she couldn't keep anything down. States she has been up all night with emesis x3; unable to keep anything down. Reports around 0000 she started having sharp pains all across her abdomen that comes and goes and is mainly in her upper abdomen; pt rates pain 10 on 0-10 pain score. Denies VB or LOF. +FM.   Onset of complaint: 0000 Pain score: 10 Vitals:   10/27/21 0700  BP: (!) 107/58  Pulse: 72  Resp: 18  Temp: 98.2 F (36.8 C)  SpO2: 100%    FHT:139 - monitors applied inside room  Lab orders placed from triage: u/a

## 2021-10-27 NOTE — MAU Provider Note (Cosign Needed Addendum)
Chief Complaint:  Emesis and Abdominal Pain   Event Date/Time   First Provider Initiated Contact with Patient 10/27/21 0717     HPI: Lori Lam is a 26 y.o. G4P2012 at 35w4dwho presents to maternity admissions reporting vomiting 3 times last night with some intermittent abdominal cramping.  Seen here yesterday for same.  Did not take any meds last night "because I had some yesterday"  did not know they only lasted 6hrs..States is worried baby has not gotten enough vitamins this week.  She reports good fetal movement, denies LOF, vaginal bleeding, vaginal itching/burning, urinary symptoms, h/a, dizziness, diarrhea, constipation or fever/chills.   Emesis  This is a recurrent problem. The current episode started yesterday. The problem occurs 2 to 4 times per day. There has been no fever. Associated symptoms include abdominal pain. Pertinent negatives include no chills, diarrhea, dizziness or fever. She has tried nothing for the symptoms.  Abdominal Pain This is a recurrent problem. The current episode started yesterday. The problem occurs intermittently. The quality of the pain is sharp. Associated symptoms include vomiting. Pertinent negatives include no diarrhea, dysuria or fever. Nothing aggravates the pain. The pain is relieved by Nothing. She has tried nothing for the symptoms.   RN Note: Lori Lam is a 26 y.o. at [redacted]w[redacted]d here in MAU reporting: was here in MAU yesterday for N/V was sent home and told to come back if she couldn't keep anything down. States she has been up all night with emesis x3; unable to keep anything down. Reports around 0000 she started having sharp pains all across her abdomen that comes and goes and is mainly in her upper abdomen; pt rates pain 10 on 0-10 pain score. Denies VB or LOF. +FM.   Past Medical History: Past Medical History:  Diagnosis Date   Anemia    Anxiety    Asthma    Bacterial vaginitis    Chlamydia contact, treated    GERD  (gastroesophageal reflux disease)     Past obstetric history: OB History  Gravida Para Term Preterm AB Living  4 2 2 0 1 2  SAB IAB Ectopic Multiple Live Births  0 1 0 0 2    # Outcome Date GA Lbr Len/2nd Weight Sex Delivery Anes PTL Lv  4 Current           3 Term 09/25/16 [redacted]w[redacted]d 17:05 / 00:17 3495 g M Vag-Spont EPI  LIV  2 Term 12/27/12 [redacted]w[redacted]d 15:49 / 00:37 3073 g M Vag-Spont EPI  LIV  1 IAB             Past Surgical History: Past Surgical History:  Procedure Laterality Date   HERNIA REPAIR      Family History: Family History  Problem Relation Age of Onset   Hypertension Mother    Hypertension Father    Hypertension Maternal Grandmother    Hypertension Maternal Grandfather    Hypertension Paternal Grandmother    Hypertension Other    Diabetes Other     Social History: Social History   Tobacco Use   Smoking status: Former    Packs/day: 0.25    Types: Cigarettes    Quit date: 03/2021    Years since quitting: 0.6   Smokeless tobacco: Never   Tobacco comments:    2 cigs/day  Vaping Use   Vaping Use: Never used  Substance Use Topics   Alcohol use: No    Comment: socially, not since confirmed pregnancy   Drug use: No      Allergies: No Known Allergies  Meds:  Medications Prior to Admission  Medication Sig Dispense Refill Last Dose   DICLEGIS 10-10 MG TBEC Take 2 tablets by mouth at bedtime. If symptoms persist, add one tablet in the morning and one in the afternoon 100 tablet 5 Past Week   fluticasone-salmeterol (ADVAIR DISKUS) 250-50 MCG/ACT AEPB Inhale 1 puff into the lungs in the morning and at bedtime. 1 each 6 10/26/2021   loratadine (CLARITIN) 10 MG tablet Take 1 tablet (10 mg total) by mouth daily. 30 tablet 6 Past Week   pantoprazole (PROTONIX) 40 MG tablet Take 1 tablet (40 mg total) by mouth daily. 30 tablet 4 10/26/2021   Prenatal Vit-Fe Fumarate-FA (PREPLUS) 27-1 MG TABS Take 1 tablet by mouth daily. 30 tablet 13 Past Week   promethazine (PHENERGAN)  25 MG tablet Take 1 tablet (25 mg total) by mouth every 6 (six) hours as needed for nausea or vomiting. 30 tablet 1 Past Week   Albuterol Sulfate (PROAIR RESPICLICK) 108 (90 Base) MCG/ACT AEPB Inhale 2 puffs into the lungs every 4 (four) hours as needed. (Patient not taking: Reported on 09/23/2021) 1 each 12    Blood Pressure Monitoring (BLOOD PRESSURE KIT) DEVI 1 kit by Does not apply route once a week. 1 each 0    Elastic Bandages & Supports (COMFORT FIT MATERNITY SUPP LG) MISC Wear daily when ambulating (Patient not taking: Reported on 08/02/2021) 1 each 0     I have reviewed patient's Past Medical Hx, Surgical Hx, Family Hx, Social Hx, medications and allergies.   ROS:  Review of Systems  Constitutional:  Negative for chills and fever.  Gastrointestinal:  Positive for abdominal pain and vomiting. Negative for diarrhea.  Genitourinary:  Negative for dysuria.  Neurological:  Negative for dizziness.   Other systems negative  Physical Exam  Patient Vitals for the past 24 hrs:  BP Temp Temp src Pulse Resp SpO2 Height Weight  10/27/21 0708 (!) 108/57 -- -- 70 -- 100 % -- --  10/27/21 0700 (!) 107/58 98.2 F (36.8 C) Oral 72 18 100 % 5' 4" (1.626 m) 101.5 kg   Constitutional: Well-developed, well-nourished female in no acute distress.  Cardiovascular: normal rate and rhythm Respiratory: normal effort, clear to auscultation bilaterally GI: Abd soft, non-tender, gravid appropriate for gestational age.   No rebound or guarding. MS: Extremities nontender, no edema, normal ROM Neurologic: Alert and oriented x 4.  GU: Neg CVAT.    FHT:  Baseline 130s , moderate variability, accelerations present, no decelerations Contractions: Irregular     Labs (from Yesterday): Results for orders placed or performed during the hospital encounter of 10/26/21 (from the past 24 hour(s))  Urinalysis, Routine w reflex microscopic Urine, Clean Catch     Status: Abnormal   Collection Time: 10/26/21  9:13 AM   Result Value Ref Range   Color, Urine YELLOW YELLOW   APPearance HAZY (A) CLEAR   Specific Gravity, Urine 1.017 1.005 - 1.030   pH 7.0 5.0 - 8.0   Glucose, UA NEGATIVE NEGATIVE mg/dL   Hgb urine dipstick NEGATIVE NEGATIVE   Bilirubin Urine NEGATIVE NEGATIVE   Ketones, ur NEGATIVE NEGATIVE mg/dL   Protein, ur NEGATIVE NEGATIVE mg/dL   Nitrite NEGATIVE NEGATIVE   Leukocytes,Ua TRACE (A) NEGATIVE   WBC, UA 0-5 0 - 5 WBC/hpf   Bacteria, UA RARE (A) NONE SEEN   Squamous Epithelial / LPF 0-5 0 - 5   Mucus PRESENT   Comprehensive metabolic panel       Status: Abnormal   Collection Time: 10/26/21 10:00 AM  Result Value Ref Range   Sodium 135 135 - 145 mmol/L   Potassium 4.1 3.5 - 5.1 mmol/L   Chloride 105 98 - 111 mmol/L   CO2 20 (L) 22 - 32 mmol/L   Glucose, Bld 82 70 - 99 mg/dL   BUN 9 6 - 20 mg/dL   Creatinine, Ser 0.69 0.44 - 1.00 mg/dL   Calcium 9.2 8.9 - 10.3 mg/dL   Total Protein 7.3 6.5 - 8.1 g/dL   Albumin 3.1 (L) 3.5 - 5.0 g/dL   AST 19 15 - 41 U/L   ALT 16 0 - 44 U/L   Alkaline Phosphatase 109 38 - 126 U/L   Total Bilirubin 0.3 0.3 - 1.2 mg/dL   GFR, Estimated >60 >60 mL/min   Anion gap 10 5 - 15   B/Positive/-- (01/17 1645)  Imaging:   MAU Course/MDM: I have reviewed the triage vital signs and the nursing notes.   Pertinent labs & imaging results that were available during my care of the patient were reviewed by me and considered in my medical decision making (see chart for details).      I have reviewed her medical records including past results, notes and treatments.   I have ordered IV fluids, MVI and Zofran . Will give some Levsin for intestinal cramping NST reviewed .    Assessment: Single IUP at [redacted]w[redacted]d Nausea and vomiting Mild dehydration  Plan: Care turned over to oncoming provider   Marie Williams CNM, MSN Certified Nurse-Midwife 10/27/2021 7:17 AM  Reassessment (1000) -patient resting in bed, she has tolerated sprite and reports cessation of  her nausea.  -NST; 130 bpm, mod var, present acel, no decels, no contractions -patient has not had any vomiting for several hours and has been sleeping in MAU  1. Nausea and vomiting, unspecified vomiting type   2. [redacted] weeks gestation of pregnancy   -patient stable for discharge with RX for levsin and zofran; strict return precautions reviewed. Pat -all questions answered , patient stable for discharge.   

## 2021-11-01 ENCOUNTER — Ambulatory Visit: Payer: Medicaid Other | Admitting: *Deleted

## 2021-11-01 ENCOUNTER — Other Ambulatory Visit: Payer: Self-pay | Admitting: Obstetrics

## 2021-11-01 ENCOUNTER — Ambulatory Visit (INDEPENDENT_AMBULATORY_CARE_PROVIDER_SITE_OTHER): Payer: Medicaid Other | Admitting: Family Medicine

## 2021-11-01 ENCOUNTER — Other Ambulatory Visit (HOSPITAL_COMMUNITY)
Admission: RE | Admit: 2021-11-01 | Discharge: 2021-11-01 | Disposition: A | Discharge status: Home / Self Care | Payer: Medicaid Other | Source: Ambulatory Visit | Attending: Family Medicine | Admitting: Family Medicine

## 2021-11-01 ENCOUNTER — Encounter: Payer: Self-pay | Admitting: *Deleted

## 2021-11-01 ENCOUNTER — Ambulatory Visit: Payer: Medicaid Other | Attending: Obstetrics and Gynecology

## 2021-11-01 VITALS — BP 113/72 | HR 75 | Wt 222.7 lb

## 2021-11-01 VITALS — BP 116/62 | HR 76

## 2021-11-01 DIAGNOSIS — Z3480 Encounter for supervision of other normal pregnancy, unspecified trimester: Secondary | ICD-10-CM

## 2021-11-01 DIAGNOSIS — Z3A36 36 weeks gestation of pregnancy: Secondary | ICD-10-CM

## 2021-11-01 DIAGNOSIS — O35EXX0 Maternal care for other (suspected) fetal abnormality and damage, fetal genitourinary anomalies, not applicable or unspecified: Secondary | ICD-10-CM | POA: Diagnosis present

## 2021-11-01 DIAGNOSIS — O99213 Obesity complicating pregnancy, third trimester: Secondary | ICD-10-CM | POA: Diagnosis not present

## 2021-11-01 DIAGNOSIS — E669 Obesity, unspecified: Secondary | ICD-10-CM

## 2021-11-01 DIAGNOSIS — O365930 Maternal care for other known or suspected poor fetal growth, third trimester, not applicable or unspecified: Secondary | ICD-10-CM

## 2021-11-01 DIAGNOSIS — O99513 Diseases of the respiratory system complicating pregnancy, third trimester: Secondary | ICD-10-CM

## 2021-11-01 DIAGNOSIS — J45909 Unspecified asthma, uncomplicated: Secondary | ICD-10-CM

## 2021-11-01 NOTE — Procedures (Signed)
Lori Lam 06/30/1995 [redacted]w[redacted]d  Fetus A Non-Stress Test Interpretation for 11/01/21  Indication: IUGR  Fetal Heart Rate A Mode: External Baseline Rate (A): 135 bpm Variability: Moderate Accelerations: 15 x 15 Decelerations: None Multiple birth?: No  Uterine Activity Mode: Palpation, Toco Contraction Frequency (min): UI Contraction Quality: Mild Resting Tone Palpated: Relaxed Resting Time: Adequate  Interpretation (Fetal Testing) Nonstress Test Interpretation: Reactive Comments: Dr. Parke Poisson reviewed tracing.

## 2021-11-03 LAB — CERVICOVAGINAL ANCILLARY ONLY
Chlamydia: NEGATIVE
Comment: NEGATIVE
Comment: NORMAL
Neisseria Gonorrhea: NEGATIVE

## 2021-11-03 NOTE — Progress Notes (Signed)
   PRENATAL VISIT NOTE  Subjective:  Lori Lam is a 26 y.o. [phone] at [redacted]w[redacted]d being seen today for ongoing prenatal care.  She is currently monitored for the following issues for this high-risk pregnancy and has H/O chlamydia infection; Asthma; Depression; Marijuana use; Mild concussion; Supervision of normal pregnancy; Maternal obesity affecting pregnancy, antepartum; and IUGR (intrauterine growth restriction) affecting care of mother on their problem list.  Patient reports no complaints.  Contractions: Irritability. Vag. Bleeding: None.  Movement: Present. Denies leaking of fluid.   The following portions of the patient's history were reviewed and updated as appropriate: allergies, current medications, past family history, past medical history, past social history, past surgical history and problem list.   Objective:   Vitals:   11/01/21 1340  BP: 113/72  Pulse: 75  Weight: 222 lb 11.2 oz (101 kg)    Fetal Status: Fetal Heart Rate (bpm): 138 Fundal Height: 34 cm Movement: Present  Presentation: Vertex  General:  Alert, oriented and cooperative. Patient is in no acute distress.  Skin: Skin is warm and dry. No rash noted.   Cardiovascular: Normal heart rate noted  Respiratory: Normal respiratory effort, no problems with respiration noted  Abdomen: Soft, gravid, appropriate for gestational age.  Pain/Pressure: Present     Pelvic: Cervical exam performed in the presence of a chaperone Dilation: 1 Effacement (%): Thick Station: -3, -2  Extremities: Normal range of motion.  Edema: Mild pitting, slight indentation  Mental Status: Normal mood and affect. Normal behavior. Normal judgment and thought content.   Assessment and Plan:  Pregnancy: [id number] at [redacted]w[redacted]d 1. Supervision of other normal pregnancy, antepartum Cultures today - Cervicovaginal ancillary only( [place]) - Culture, beta strep (group b only)  2. Poor fetal growth affecting management of mother in third trimester,  single or unspecified fetus Last growth at 6%--for IOL at 37 weeks Orders placed  Preterm labor symptoms and general obstetric precautions including but not limited to vaginal bleeding, contractions, leaking of fluid and fetal movement were reviewed in detail with the patient. Please refer to After Visit Summary for other counseling recommendations.   Return in 1 week (on 11/08/2021).  Future Appointments  Date Time Provider Department Center  11/08/2021 12:00 AM [facility] SCHED ROOM [facility] None  11/08/2021  4:15 PM [place] Bing, MD [facility]  11/15/2021  2:35 PM Warden Fillers, MD [facility]  11/22/2021  2:15 PM Warden Fillers, MD [facility]  11/29/2021  3:55 PM Milas Hock, MD [facility]    Reva Bores, MD

## 2021-11-04 ENCOUNTER — Other Ambulatory Visit: Payer: Self-pay | Admitting: Advanced Practice Midwife

## 2021-11-04 ENCOUNTER — Other Ambulatory Visit: Payer: Self-pay | Admitting: Student

## 2021-11-05 LAB — CULTURE, BETA STREP (GROUP B ONLY): Strep Gp B Culture: NEGATIVE

## 2021-11-08 ENCOUNTER — Inpatient Hospital Stay (HOSPITAL_COMMUNITY)
Admission: RE | Admit: 2021-11-08 | Discharge: 2021-11-10 | DRG: 807 | Disposition: A | Discharge status: Home / Self Care | Payer: Medicaid Other | Attending: Obstetrics and Gynecology | Admitting: Obstetrics and Gynecology

## 2021-11-08 ENCOUNTER — Inpatient Hospital Stay (HOSPITAL_COMMUNITY): Payer: Medicaid Other

## 2021-11-08 ENCOUNTER — Inpatient Hospital Stay (HOSPITAL_COMMUNITY)
Admission: AD | Admit: 2021-11-08 | Payer: Medicaid Other | Source: Home / Self Care | Admitting: Obstetrics and Gynecology

## 2021-11-08 ENCOUNTER — Telehealth: Payer: Medicaid Other | Admitting: Obstetrics and Gynecology

## 2021-11-08 ENCOUNTER — Inpatient Hospital Stay (HOSPITAL_COMMUNITY): Payer: Medicaid Other | Admitting: Anesthesiology

## 2021-11-08 ENCOUNTER — Other Ambulatory Visit: Payer: Self-pay

## 2021-11-08 ENCOUNTER — Encounter (HOSPITAL_COMMUNITY): Payer: Self-pay | Admitting: Family Medicine

## 2021-11-08 DIAGNOSIS — O365930 Maternal care for other known or suspected poor fetal growth, third trimester, not applicable or unspecified: Principal | ICD-10-CM | POA: Diagnosis present

## 2021-11-08 DIAGNOSIS — O365990 Maternal care for other known or suspected poor fetal growth, unspecified trimester, not applicable or unspecified: Secondary | ICD-10-CM | POA: Diagnosis present

## 2021-11-08 DIAGNOSIS — Z3403 Encounter for supervision of normal first pregnancy, third trimester: Principal | ICD-10-CM

## 2021-11-08 DIAGNOSIS — J45909 Unspecified asthma, uncomplicated: Secondary | ICD-10-CM | POA: Diagnosis present

## 2021-11-08 DIAGNOSIS — O99210 Obesity complicating pregnancy, unspecified trimester: Secondary | ICD-10-CM

## 2021-11-08 DIAGNOSIS — O99214 Obesity complicating childbirth: Secondary | ICD-10-CM | POA: Diagnosis present

## 2021-11-08 DIAGNOSIS — O9952 Diseases of the respiratory system complicating childbirth: Secondary | ICD-10-CM | POA: Diagnosis present

## 2021-11-08 DIAGNOSIS — Z3490 Encounter for supervision of normal pregnancy, unspecified, unspecified trimester: Secondary | ICD-10-CM

## 2021-11-08 DIAGNOSIS — Z87891 Personal history of nicotine dependence: Secondary | ICD-10-CM

## 2021-11-08 DIAGNOSIS — Z3A37 37 weeks gestation of pregnancy: Secondary | ICD-10-CM

## 2021-11-08 LAB — CBC
HCT: 35.1 % — ABNORMAL LOW (ref 36.0–46.0)
Hemoglobin: 11.5 g/dL — ABNORMAL LOW (ref 12.0–15.0)
MCH: 29.3 pg (ref 26.0–34.0)
MCHC: 32.8 g/dL (ref 30.0–36.0)
MCV: 89.3 fL (ref 80.0–100.0)
Platelets: 275 10*3/uL (ref 150–400)
RBC: 3.93 MIL/uL (ref 3.87–5.11)
RDW: 13.4 % (ref 11.5–15.5)
WBC: 11.7 10*3/uL — ABNORMAL HIGH (ref 4.0–10.5)
nRBC: 0 % (ref 0.0–0.2)

## 2021-11-08 LAB — TYPE AND SCREEN
ABO/RH(D): B POS
Antibody Screen: NEGATIVE

## 2021-11-08 LAB — RPR: RPR Ser Ql: NONREACTIVE

## 2021-11-08 MED ORDER — PRENATAL MULTIVITAMIN CH: 1.0000 | ORAL_TABLET | Freq: Every day | ORAL | Status: DC

## 2021-11-08 MED ORDER — ALBUTEROL SULFATE (2.5 MG/3ML) 0.083% IN NEBU: 3.0000 mL | INHALATION_SOLUTION | Freq: Four times a day (QID) | RESPIRATORY_TRACT | Status: DC | PRN

## 2021-11-08 MED ORDER — PHENYLEPHRINE 80 MCG/ML (10ML) SYRINGE FOR IV PUSH (FOR BLOOD PRESSURE SUPPORT)
80.0000 ug | PREFILLED_SYRINGE | INTRAVENOUS | Status: DC | PRN
  Administered 2021-11-08: 80 ug via INTRAVENOUS
  Filled 2021-11-08: qty 10

## 2021-11-08 MED ORDER — ONDANSETRON HCL 4 MG/2ML IJ SOLN: 4.0000 mg | INTRAMUSCULAR | Status: DC | PRN

## 2021-11-08 MED ORDER — MEASLES, MUMPS & RUBELLA VAC IJ SOLR: 0.5000 mL | Freq: Once | INTRAMUSCULAR | Status: DC

## 2021-11-08 MED ORDER — TERBUTALINE SULFATE 1 MG/ML IJ SOLN: 0.2500 mg | Freq: Once | INTRAMUSCULAR | Status: DC | PRN

## 2021-11-08 MED ORDER — FLEET ENEMA 7-19 GM/118ML RE ENEM: 1.0000 | ENEMA | RECTAL | Status: DC | PRN

## 2021-11-08 MED ORDER — MOMETASONE FURO-FORMOTEROL FUM 200-5 MCG/ACT IN AERO
2.0000 | INHALATION_SPRAY | Freq: Two times a day (BID) | RESPIRATORY_TRACT | Status: DC
  Filled 2021-11-08: qty 8.8

## 2021-11-08 MED ORDER — FENTANYL CITRATE (PF) 100 MCG/2ML IJ SOLN
100.0000 ug | INTRAMUSCULAR | Status: DC | PRN
  Administered 2021-11-08 (×2): 100 ug via INTRAVENOUS
  Filled 2021-11-08: qty 2

## 2021-11-08 MED ORDER — FENTANYL CITRATE (PF) 100 MCG/2ML IJ SOLN
INTRAMUSCULAR | Status: AC
  Filled 2021-11-08: qty 2

## 2021-11-08 MED ORDER — OXYCODONE-ACETAMINOPHEN 5-325 MG PO TABS: 1.0000 | ORAL_TABLET | ORAL | Status: DC | PRN

## 2021-11-08 MED ORDER — MISOPROSTOL 25 MCG QUARTER TABLET: 25.0000 ug | ORAL_TABLET | ORAL | Status: DC | PRN

## 2021-11-08 MED ORDER — FERROUS SULFATE 325 (65 FE) MG PO TABS
325.0000 mg | ORAL_TABLET | ORAL | Status: DC
  Administered 2021-11-09: 325 mg via ORAL
  Filled 2021-11-08 (×2): qty 1

## 2021-11-08 MED ORDER — DIPHENHYDRAMINE HCL 50 MG/ML IJ SOLN: 12.5000 mg | INTRAMUSCULAR | Status: DC | PRN

## 2021-11-08 MED ORDER — TETANUS-DIPHTH-ACELL PERTUSSIS 5-2.5-18.5 LF-MCG/0.5 IM SUSY: 0.5000 mL | PREFILLED_SYRINGE | Freq: Once | INTRAMUSCULAR | Status: DC

## 2021-11-08 MED ORDER — BENZOCAINE-MENTHOL 20-0.5 % EX AERO
1.0000 | INHALATION_SPRAY | CUTANEOUS | Status: DC | PRN
  Administered 2021-11-09: 1 via TOPICAL
  Filled 2021-11-08: qty 56

## 2021-11-08 MED ORDER — ACETAMINOPHEN 325 MG PO TABS: 650.0000 mg | ORAL_TABLET | ORAL | Status: DC | PRN

## 2021-11-08 MED ORDER — SOD CITRATE-CITRIC ACID 500-334 MG/5ML PO SOLN: 30.0000 mL | ORAL | Status: DC | PRN

## 2021-11-08 MED ORDER — LIDOCAINE HCL (PF) 1 % IJ SOLN
INTRAMUSCULAR | Status: DC | PRN
  Administered 2021-11-08: 10 mL via EPIDURAL

## 2021-11-08 MED ORDER — FENTANYL-BUPIVACAINE-NACL 0.5-0.125-0.9 MG/250ML-% EP SOLN
12.0000 mL/h | EPIDURAL | Status: DC | PRN
  Administered 2021-11-08: 12 mL/h via EPIDURAL
  Filled 2021-11-08: qty 250

## 2021-11-08 MED ORDER — SIMETHICONE 80 MG PO CHEW: 80.0000 mg | CHEWABLE_TABLET | ORAL | Status: DC | PRN

## 2021-11-08 MED ORDER — ONDANSETRON HCL 4 MG PO TABS: 4.0000 mg | ORAL_TABLET | ORAL | Status: DC | PRN

## 2021-11-08 MED ORDER — METHYLERGONOVINE MALEATE 0.2 MG/ML IJ SOLN: 0.2000 mg | INTRAMUSCULAR | Status: DC | PRN

## 2021-11-08 MED ORDER — METHYLERGONOVINE MALEATE 0.2 MG PO TABS: 0.2000 mg | ORAL_TABLET | ORAL | Status: DC | PRN

## 2021-11-08 MED ORDER — BISACODYL 10 MG RE SUPP: 10.0000 mg | Freq: Every day | RECTAL | Status: DC | PRN

## 2021-11-08 MED ORDER — DIBUCAINE (PERIANAL) 1 % EX OINT: 1.0000 | TOPICAL_OINTMENT | CUTANEOUS | Status: DC | PRN

## 2021-11-08 MED ORDER — ONDANSETRON HCL 4 MG/2ML IJ SOLN: 4.0000 mg | Freq: Four times a day (QID) | INTRAMUSCULAR | Status: DC | PRN

## 2021-11-08 MED ORDER — LACTATED RINGERS IV SOLN
500.0000 mL | INTRAVENOUS | Status: DC | PRN
  Administered 2021-11-08: 1000 mL via INTRAVENOUS

## 2021-11-08 MED ORDER — EPHEDRINE 5 MG/ML INJ
10.0000 mg | INTRAVENOUS | Status: DC | PRN
Start: 2021-11-08 — End: 2021-11-08

## 2021-11-08 MED ORDER — OXYTOCIN-SODIUM CHLORIDE 30-0.9 UT/500ML-% IV SOLN
1.0000 m[IU]/min | INTRAVENOUS | Status: DC
  Administered 2021-11-08: 2 m[IU]/min via INTRAVENOUS

## 2021-11-08 MED ORDER — EPHEDRINE 5 MG/ML INJ
10.0000 mg | INTRAVENOUS | Status: DC | PRN
  Filled 2021-11-08: qty 5

## 2021-11-08 MED ORDER — LIDOCAINE HCL (PF) 1 % IJ SOLN: 30.0000 mL | INTRAMUSCULAR | Status: DC | PRN

## 2021-11-08 MED ORDER — OXYCODONE-ACETAMINOPHEN 5-325 MG PO TABS: 2.0000 | ORAL_TABLET | ORAL | Status: DC | PRN

## 2021-11-08 MED ORDER — IBUPROFEN 600 MG PO TABS
600.0000 mg | ORAL_TABLET | Freq: Four times a day (QID) | ORAL | Status: DC
  Administered 2021-11-08 – 2021-11-10 (×7): 600 mg via ORAL
  Filled 2021-11-08 (×7): qty 1

## 2021-11-08 MED ORDER — MEDROXYPROGESTERONE ACETATE 150 MG/ML IM SUSP: 150.0000 mg | INTRAMUSCULAR | Status: DC | PRN

## 2021-11-08 MED ORDER — LACTATED RINGERS IV SOLN: INTRAVENOUS | Status: DC

## 2021-11-08 MED ORDER — DIPHENHYDRAMINE HCL 25 MG PO CAPS: 25.0000 mg | ORAL_CAPSULE | Freq: Four times a day (QID) | ORAL | Status: DC | PRN

## 2021-11-08 MED ORDER — PHENYLEPHRINE 80 MCG/ML (10ML) SYRINGE FOR IV PUSH (FOR BLOOD PRESSURE SUPPORT)
80.0000 ug | PREFILLED_SYRINGE | INTRAVENOUS | Status: DC | PRN
Start: 2021-11-08 — End: 2021-11-08
  Administered 2021-11-08: 80 ug via INTRAVENOUS

## 2021-11-08 MED ORDER — MISOPROSTOL 50MCG HALF TABLET
50.0000 ug | ORAL_TABLET | ORAL | Status: DC | PRN
Start: 2021-11-08 — End: 2021-11-08
  Administered 2021-11-08: 50 ug via BUCCAL
  Filled 2021-11-08 (×2): qty 1

## 2021-11-08 MED ORDER — OXYTOCIN BOLUS FROM INFUSION
333.0000 mL | Freq: Once | INTRAVENOUS | Status: AC
Start: 2021-11-08 — End: 2021-11-08
  Administered 2021-11-08: 333 mL via INTRAVENOUS

## 2021-11-08 MED ORDER — ACETAMINOPHEN 325 MG PO TABS
650.0000 mg | ORAL_TABLET | ORAL | Status: DC | PRN
  Administered 2021-11-08 – 2021-11-10 (×6): 650 mg via ORAL
  Filled 2021-11-08 (×6): qty 2

## 2021-11-08 MED ORDER — FLEET ENEMA 7-19 GM/118ML RE ENEM: 1.0000 | ENEMA | Freq: Every day | RECTAL | Status: DC | PRN

## 2021-11-08 MED ORDER — PRENATAL MULTIVITAMIN CH
1.0000 | ORAL_TABLET | Freq: Every day | ORAL | Status: DC
  Administered 2021-11-09 – 2021-11-10 (×2): 1 via ORAL
  Filled 2021-11-08 (×2): qty 1

## 2021-11-08 MED ORDER — COCONUT OIL OIL: 1.0000 | TOPICAL_OIL | Status: DC | PRN

## 2021-11-08 MED ORDER — LACTATED RINGERS IV SOLN
500.0000 mL | Freq: Once | INTRAVENOUS | Status: AC
  Administered 2021-11-08: 500 mL via INTRAVENOUS

## 2021-11-08 MED ORDER — OXYTOCIN-SODIUM CHLORIDE 30-0.9 UT/500ML-% IV SOLN
2.5000 [IU]/h | INTRAVENOUS | Status: DC
  Administered 2021-11-08: 2.5 [IU]/h via INTRAVENOUS
  Filled 2021-11-08: qty 500

## 2021-11-08 MED ORDER — SENNOSIDES-DOCUSATE SODIUM 8.6-50 MG PO TABS
2.0000 | ORAL_TABLET | ORAL | Status: DC
  Administered 2021-11-09 – 2021-11-10 (×2): 2 via ORAL
  Filled 2021-11-08 (×3): qty 2

## 2021-11-08 MED ORDER — WITCH HAZEL-GLYCERIN EX PADS
1.0000 | MEDICATED_PAD | CUTANEOUS | Status: DC | PRN
  Administered 2021-11-09: 1 via TOPICAL

## 2021-11-08 NOTE — Plan of Care (Signed)
  Problem: Education: Goal: Knowledge of General Education information will improve Description: Including pain rating scale, medication(s)/side effects and non-pharmacologic comfort measures Outcome: Progressing   Problem: Health Behavior/Discharge Planning: Goal: Ability to manage health-related needs will improve Outcome: Progressing   Problem: Clinical Measurements: Goal: Ability to maintain clinical measurements within normal limits will improve Outcome: Progressing Goal: Will remain free from infection Outcome: Progressing Goal: Diagnostic test results will improve Outcome: Progressing Goal: Respiratory complications will improve Outcome: Progressing Goal: Cardiovascular complication will be avoided Outcome: Progressing   Problem: Activity: Goal: Risk for activity intolerance will decrease Outcome: Progressing   Problem: Nutrition: Goal: Adequate nutrition will be maintained Outcome: Progressing   Problem: Coping: Goal: Level of anxiety will decrease Outcome: Progressing   Problem: Elimination: Goal: Will not experience complications related to bowel motility Outcome: Progressing Goal: Will not experience complications related to urinary retention Outcome: Progressing   Problem: Pain Managment: Goal: General experience of comfort will improve Outcome: Progressing   Problem: Safety: Goal: Ability to remain free from injury will improve Outcome: Progressing   Problem: Skin Integrity: Goal: Risk for impaired skin integrity will decrease Outcome: Progressing   Problem: Education: Goal: Knowledge of Childbirth will improve Outcome: Progressing Goal: Ability to make informed decisions regarding treatment and plan of care will improve Outcome: Progressing Goal: Ability to state and carry out methods to decrease the pain will improve Outcome: Progressing Goal: Individualized Educational Video(s) Outcome: Progressing   Problem: Coping: Goal: Ability to  verbalize concerns and feelings about labor and delivery will improve Outcome: Progressing   Problem: Life Cycle: Goal: Ability to make normal progression through stages of labor will improve Outcome: Progressing Goal: Ability to effectively push during vaginal delivery will improve Outcome: Progressing   Problem: Role Relationship: Goal: Will demonstrate positive interactions with the child Outcome: Progressing   Problem: Safety: Goal: Risk of complications during labor and delivery will decrease Outcome: Progressing   Problem: Pain Management: Goal: Relief or control of pain from uterine contractions will improve Outcome: Progressing   

## 2021-11-08 NOTE — Progress Notes (Signed)
Labor Progress Note [name] is a 26 y.o. [phone] at [redacted]w[redacted]d presented for IOL for fetal growth restriction S: Dr. Annia Friendly and myself present at bedside for cervical check and AROM. Patient reports improvement in epidural, no current pain. Support person at the bedside. No acute concerns.  O:  BP 111/69   Pulse 72   Temp 98.2 F (36.8 C) (Oral)   Resp 18   Ht 5\' 4"  (1.626 m)   Wt 101.7 kg   LMP 02/20/2021 (Exact Date)   SpO2 100%   BMI 38.48 kg/m  EFM: 130/moderate variability/+accels, no decels  CVE: Dilation: 4 Effacement (%): 60 Station: -2 Presentation: Vertex Exam by:: Dr. [email or web address]   A&P: 26 y.o. 34 [redacted]w[redacted]d IOL for FGR (6th%ile) #Labor: Progressing well. Minimal volume AROM. Continue to titrate pitocin, currently at 6.  #Pain: Epidural  #FWB: Cat I #GBS negative  [redacted]w[redacted]d, MD 1:49 PM

## 2021-11-08 NOTE — Plan of Care (Signed)
  Problem: Education: Goal: Knowledge of Childbirth will improve 11/08/2021 2207 by Thera Flake, RN Outcome: Completed/Met 11/08/2021 1938 by Thera Flake, RN Outcome: Progressing Goal: Ability to make informed decisions regarding treatment and plan of care will improve 11/08/2021 2207 by Thera Flake, RN Outcome: Completed/Met 11/08/2021 1938 by Thera Flake, RN Outcome: Progressing Goal: Ability to state and carry out methods to decrease the pain will improve 11/08/2021 2207 by Thera Flake, RN Outcome: Completed/Met 11/08/2021 1938 by Thera Flake, RN Outcome: Progressing Goal: Individualized Educational Video(s) 11/08/2021 2207 by Thera Flake, RN Outcome: Completed/Met 11/08/2021 1938 by Thera Flake, RN Outcome: Progressing   Problem: Coping: Goal: Ability to verbalize concerns and feelings about labor and delivery will improve 11/08/2021 2207 by Thera Flake, RN Outcome: Completed/Met 11/08/2021 1938 by Thera Flake, RN Outcome: Progressing   Problem: Life Cycle: Goal: Ability to make normal progression through stages of labor will improve 11/08/2021 2207 by Thera Flake, RN Outcome: Completed/Met 11/08/2021 1938 by Thera Flake, RN Outcome: Progressing Goal: Ability to effectively push during vaginal delivery will improve 11/08/2021 2207 by Thera Flake, RN Outcome: Completed/Met 11/08/2021 1938 by Thera Flake, RN Outcome: Progressing   Problem: Role Relationship: Goal: Will demonstrate positive interactions with the child 11/08/2021 2207 by Thera Flake, RN Outcome: Completed/Met 11/08/2021 1938 by Thera Flake, RN Outcome: Progressing   Problem: Safety: Goal: Risk of complications during labor and delivery will decrease 11/08/2021 2207 by Thera Flake, RN Outcome: Completed/Met 11/08/2021 1938 by Thera Flake, RN Outcome: Progressing   Problem: Pain Management: Goal: Relief or control of pain  from uterine contractions will improve 11/08/2021 2207 by Thera Flake, RN Outcome: Completed/Met 11/08/2021 1938 by Thera Flake, RN Outcome: Progressing

## 2021-11-08 NOTE — Progress Notes (Signed)
Labor Progress Note CAILYN HOUDEK is a 26 y.o. [phone] at [redacted]w[redacted]d presented for IOL for FGR (6%ile) S:  Doing well. Feeling contractions  O:  BP (!) 119/54   Pulse 64   Temp 98.5 F (36.9 C) (Oral)   Resp 16   Ht 5\' 4"  (1.626 m)   Wt 101.7 kg   LMP 02/20/2021 (Exact Date)   BMI 38.48 kg/m  EFM: 120/moderate variability/+accels/no decels  CVE: Dilation: 1.5 Effacement (%): 50 Station: -3 Presentation: Vertex Exam by:: Dr. [email or web address]   A&P: 26 y.o. 34 [redacted]w[redacted]d  IOL for FGR (6%ile) #Labor: S/p Cytotec x1. Cervix 1.5 to 2 cm. FB recommended and patient initially hesitant. We discussed doing another cytotec and revisiting FB at next check if still warranted. Discussed the possibility of getting IV pain medications with balloon placement as patient's balloon during last pregnancy was very uncomfortable. At that time patient states she is ready for the balloon with IV pain medicines. Cooks catheter placed without difficulty and tolerated well by fetus and infant and inflated with 60cc of fluid #Pain: Plans epidural, PRN IV fentanyl for now #FWB: Cat I #GBS negative  [redacted]w[redacted]d, MD, MPH OB Fellow, Faculty Practice

## 2021-11-08 NOTE — Lactation Note (Signed)
This note was copied from a baby's chart. Lactation Consultation Note Baby looks very small. Mom stated baby is tiny. Baby did latch in cross cradle position suckling at intervals. Holding breast in mouth maintaining latch but sucking occasionally. Discussed w/mom since baby is so small would like to get mom pumping when she gets on the floor. Mom in agreement. Discussed baby needing to be supplemented w/her BM first and may need more supplement. Mom is fine w/giving formula if needed. Mom didn't BF her 2nd child now 33 yrs old but did BF her 1st child now almost 12 yrs old. Will f/u mom on MBU.  Patient Name: Girl Tamika Shropshire [id number] Date: 11/08/2021 Reason for consult: L&D Initial assessment;Infant < 6lbs;Early term 37-38.6wks Age:67 hours  Maternal Data Does the patient have breastfeeding experience prior to this delivery?: Yes How long did the patient breastfeed?: 1st child now about to be 19 yrs old for 5 months. 2nd child she didn't BF  Feeding    LATCH Score Latch: Repeated attempts needed to sustain latch, nipple held in mouth throughout feeding, stimulation needed to elicit sucking reflex.  Audible Swallowing: None  Type of Nipple: Everted at rest and after stimulation  Comfort (Breast/Nipple): Soft / non-tender  Hold (Positioning): Assistance needed to correctly position infant at breast and maintain latch.  LATCH Score: 6   Lactation Tools Discussed/Used    Interventions Interventions: Adjust position;Assisted with latch;Skin to skin;Position options  Discharge    Consult Status Consult Status: Follow-up from L&D Date: 11/09/21 Follow-up type: In-patient    Charyl Dancer 11/08/2021, 9:20 PM

## 2021-11-08 NOTE — Progress Notes (Signed)
Labor Progress Note Lori Lam is a 26 y.o. [phone] at [redacted]w[redacted]d presented for IOL for FGR (6th%ile) S: Doing well, epidural medicine not completely saturating left side of body. Additional support person at bedside. No acute concerns.  O:  BP (!) 128/59   Pulse 67   Temp 98.2 F (36.8 C) (Oral)   Resp 16   Ht 5\' 4"  (1.626 m)   Wt 101.7 kg   LMP 02/20/2021 (Exact Date)   SpO2 100%   BMI 38.48 kg/m  EFM: 135/moderate/+accels, no decels  CVE: Dilation: 5 Effacement (%): 60 Station: -2 Presentation: Vertex Exam by:: [email or web address] RN   A&P: 26 y.o. 34 [redacted]w[redacted]d IOL for FGR (6th%ile) #Labor: Progressing well. S/p repeat AROM with moderate amount of clear fluid. Continue pitocin for labor augmentation.  #Pain: Epidural #FWB: Cat 1 #GBS negative  [redacted]w[redacted]d, MD 6:32 PM

## 2021-11-08 NOTE — Progress Notes (Cosign Needed)
Labor Progress Note Lori Lam is a 26 y.o. [phone] at [redacted]w[redacted]d presented for IOL for fetal growth restriction S: Introduced myself to patient. She is doing well, still able to feel her left leg and has full ROM independently despite having epidural and having decreased motion and sensation on the right. Denies painful CTXs at this time. FB now out, pitocin started at 0900.   O:  BP (!) 106/51   Pulse 70   Temp 98.2 F (36.8 C) (Oral)   Resp 16   Ht 5\' 4"  (1.626 m)   Wt 101.7 kg   LMP 02/20/2021 (Exact Date)   SpO2 100%   BMI 38.48 kg/m  EFM: 125 bpm/moderate variability/+accels, no decels  CVE: Dilation: 3 Effacement (%): 70 Station: -3 Presentation: Vertex (by leopolds) Exam by:: [email or web address] RN   A&P: 26 y.o. 34 [redacted]w[redacted]d here for IOL for FGR (6th%ile) #Labor: Progressing well. S/p cytotec x2, FB now out. Pitocin currently at 4. Epidural placed and functional, having nonpainful CTXs. Discussed AROM at next check pending progression. Patient initially hesitant, expresses concern regarding risk for prolapse given how small fetus is estimated at; provided reassurance. Continue pitocin, monitor CTX pattern and fetal tolerance.  #Pain: Epidural #FWB: Cat I #GBS negative   [redacted]w[redacted]d, MD 11:06 AM

## 2021-11-08 NOTE — Anesthesia Procedure Notes (Signed)
Epidural Patient location during procedure: OB Start time: 11/08/2021 7:40 AM End time: 11/08/2021 7:45 AM  Staffing Anesthesiologist: Leilani Able, MD Performed: anesthesiologist   Preanesthetic Checklist Completed: patient identified, IV checked, site marked, risks and benefits discussed, surgical consent, monitors and equipment checked, pre-op evaluation and timeout performed  Epidural Patient position: sitting Prep: DuraPrep and site prepped and draped Patient monitoring: continuous pulse ox and blood pressure Approach: midline Injection technique: LOR air  Needle:  Needle type: Tuohy  Needle gauge: 17 G Needle length: 9 cm and 9 Needle insertion depth: 6 cm Catheter type: closed end flexible Catheter size: 19 Gauge Catheter at skin depth: 11 cm Test dose: negative and Other  Assessment Events: blood not aspirated, injection not painful, no injection resistance, no paresthesia and negative IV test

## 2021-11-08 NOTE — Anesthesia Preprocedure Evaluation (Signed)
Anesthesia Evaluation  Patient identified by MRN, date of birth, ID band Patient awake    Reviewed: Allergy & Precautions, H&P , Patient's Chart, lab work & pertinent test results  Airway Mallampati: II       Dental no notable dental hx.    Pulmonary neg pulmonary ROS, former smoker,    Pulmonary exam normal breath sounds clear to auscultation       Cardiovascular negative cardio ROS Normal cardiovascular exam     Neuro/Psych negative neurological ROS  negative psych ROS   GI/Hepatic Neg liver ROS, GERD  Medicated,  Endo/Other  negative endocrine ROS  Renal/GU negative Renal ROS  negative genitourinary   Musculoskeletal   Abdominal (+) + obese,   Peds  Hematology  (+) Blood dyscrasia, anemia ,   Anesthesia Other Findings   Reproductive/Obstetrics (+) Pregnancy                             Anesthesia Physical  Anesthesia Plan  ASA: II  Anesthesia Plan: Epidural   Post-op Pain Management:    Induction:   PONV Risk Score and Plan:   Airway Management Planned:   Additional Equipment:   Intra-op Plan:   Post-operative Plan:   Informed Consent: I have reviewed the patients History and Physical, chart, labs and discussed the procedure including the risks, benefits and alternatives for the proposed anesthesia with the patient or authorized representative who has indicated his/her understanding and acceptance.       Plan Discussed with:   Anesthesia Plan Comments:         Anesthesia Quick Evaluation

## 2021-11-08 NOTE — Discharge Summary (Signed)
Postpartum Discharge Summary  Date of Service updated***     Patient Name: Lori Lam DOB: 1995-09-17 MRN: [id number]  Date of admission: 11/08/2021 Delivery date:11/08/2021  Delivering provider: Christin Fudge  Date of discharge: 11/08/2021  Admitting diagnosis: Fetal growth restriction antepartum [O36.5990] Intrauterine pregnancy: [redacted]w[redacted]d    Secondary diagnosis:  Principal Problem:   Fetal growth restriction antepartum  Additional problems: asthma    Discharge diagnosis: Term Pregnancy Delivered                                              Post partum procedures:{Postpartum procedures:23558} Augmentation: AROM, Pitocin, Cytotec, and IP Foley Complications: None  Hospital course: Induction of Labor With Vaginal Delivery   26y.o. yo [id number] 339w2das admitted to the hospital 11/08/2021 for induction of labor.  Indication for induction:  FGR .  Patient had an uncomplicated labor course as follows: Membrane Rupture Time/Date: 1:07 PM ,11/08/2021   Delivery Method:Vaginal, Spontaneous  Episiotomy: None  Lacerations:  None  Details of delivery can be found in separate delivery note.  Patient had a routine postpartum course. Patient is discharged home 11/08/21.  Newborn Data: Birth date:11/08/2021  Birth time:8:14 PM  Gender:Female  Living status:Living  Apgars:8 ,9  Weight:   Magnesium Sulfate received: No BMZ received: No Rhophylac:N/A MMR:N/A T-DaP:Given prenatally Flu: No Transfusion:{Transfusion received:30440034}  Physical exam  Vitals:   11/08/21 1931 11/08/21 2001 11/08/21 2023 11/08/21 2031  BP: 120/60 120/80 123/61 134/67  Pulse: 69 73 76 79  Resp:      Temp:      TempSrc:      SpO2:      Weight:      Height:       General: {Exam; general:21111117} Lochia: {Desc; appropriate/inappropriate:30686::"appropriate"} Uterine Fundus: {Desc; firm/soft:30687} Incision: {Exam; incision:21111123} DVT Evaluation: {Exam;  dvt:2111122} Labs: Lab Results  Component Value Date   WBC 11.7 (H) 11/08/2021   HGB 11.5 (L) 11/08/2021   HCT 35.1 (L) 11/08/2021   MCV 89.3 11/08/2021   PLT 275 11/08/2021      Latest Ref Rng & Units 10/26/2021   10:00 AM  CMP  Glucose 70 - 99 mg/dL 82   BUN 6 - 20 mg/dL 9   Creatinine 0.44 - 1.00 mg/dL 0.69   Sodium 135 - 145 mmol/L 135   Potassium 3.5 - 5.1 mmol/L 4.1   Chloride 98 - 111 mmol/L 105   CO2 22 - 32 mmol/L 20   Calcium 8.9 - 10.3 mg/dL 9.2   Total Protein 6.5 - 8.1 g/dL 7.3   Total Bilirubin 0.3 - 1.2 mg/dL 0.3   Alkaline Phos 38 - 126 U/L 109   AST 15 - 41 U/L 19   ALT 0 - 44 U/L 16    Edinburgh Score:     No data to display           After visit meds:  Allergies as of 11/08/2021   No Known Allergies   Med Rec must be completed prior to [facility]        Discharge home in stable condition Infant Feeding: {Baby feeding:23562} Infant Disposition:{CHL IP OB HOME WITH MOOEHOZY:24825}ischarge instruction: per After Visit Summary and Postpartum booklet. Activity: Advance as tolerated. Pelvic rest for 6 weeks.  Diet: {OB diOIBB:04888916}uture Appointments:No future appointments. Follow up Visit:  Please schedule this patient for a In person postpartum visit in 4 weeks with the following provider: Any provider. Additional Postpartum F/U:  Low risk pregnancy complicated by:  FGR Delivery mode:  Vaginal, Spontaneous  Anticipated Birth Control:  Unsure   11/08/2021 Christin Fudge, CNM

## 2021-11-08 NOTE — H&P (Addendum)
OBSTETRIC ADMISSION HISTORY AND PHYSICAL  Lori Lam is a 26 y.o. female [phone] with IUP at 68w2dby LMP presenting for IOL for IUGR (6%ile), normal dopplers at last [place] She reports +FMs, No LOF, no VB, no blurry vision, headaches or peripheral edema, and RUQ pain.  She plans on breast feeding. She is undecided about her birth control. [facility] Dating: By LMP --->  Estimated Date of Delivery: 11/27/21  Sono:   '@[redacted]w[redacted]d' , CWD, normal anatomy, cephalic presentation, 19678L 6% EFW   Prenatal History/Complications:  IUGR at 6%ile, previously with elevated dopplers - now resolved Asthma - PRN albuterol use   Past Medical History: Past Medical History:  Diagnosis Date   Anemia    Anxiety    Asthma    Bacterial vaginitis    Chlamydia contact, treated    GERD (gastroesophageal reflux disease)     Past Surgical History: Past Surgical History:  Procedure Laterality Date   HERNIA REPAIR      Obstetrical History: OB History     Gravida  4   Para  2   Term  2   Preterm  0   AB  1   Living  2      SAB  0   IAB  1   Ectopic  0   Multiple  0   Live Births  2           Social History Social History   Socioeconomic History   Marital status: Single    Spouse name: Not on file   Number of children: Not on file   Years of education: Not on file   Highest education level: Not on file  Occupational History   Not on file  Tobacco Use   Smoking status: Former    Packs/day: 0.25    Types: Cigarettes    Quit date: 03/2021    Years since quitting: 0.6   Smokeless tobacco: Never   Tobacco comments:    2 cigs/day  Vaping Use   Vaping Use: Never used  Substance and Sexual Activity   Alcohol use: No    Comment: socially, not since confirmed pregnancy   Drug use: No   Sexual activity: Yes    Partners: Male    Birth control/protection: None  Other Topics Concern   Not on file  Social History Narrative   Not on  file   Social Determinants of Health   Financial Resource Strain: Not on file  Food Insecurity: No Food Insecurity (09/23/2021)   Hunger Vital Sign    Worried About Running Out of Food in the Last Year: Never true    Ran Out of Food in the Last Year: Never true  Transportation Needs: No Transportation Needs (09/23/2021)   PRAPARE - THydrologist(Medical): No    Lack of Transportation (Non-Medical): No  Physical Activity: Not on file  Stress: Not on file  Social Connections: Not on file    Family History: Family History  Problem Relation Age of Onset   Hypertension Mother    Hypertension Father    Hypertension Maternal Grandmother    Hypertension Maternal Grandfather    Hypertension Paternal Grandmother    Hypertension Other    Diabetes Other     Allergies: No Known Allergies   Medications Prior to Admission  Medication Sig Dispense Refill Last Dose   ondansetron (ZOFRAN) 8 MG tablet Take 1 tablet (8  mg total) by mouth every 8 (eight) hours as needed for nausea or vomiting. 20 tablet 0 11/07/2021   Albuterol Sulfate (PROAIR RESPICLICK) 536 (90 Base) MCG/ACT AEPB Inhale 2 puffs into the lungs every 4 (four) hours as needed. (Patient not taking: Reported on 09/23/2021) 1 each 12    Blood Pressure Monitoring (BLOOD PRESSURE KIT) DEVI 1 kit by Does not apply route once a week. 1 each 0    DICLEGIS 10-10 MG TBEC Take 2 tablets by mouth at bedtime. If symptoms persist, add one tablet in the morning and one in the afternoon 100 tablet 5    Elastic Bandages & Supports (COMFORT FIT MATERNITY SUPP LG) MISC Wear daily when ambulating (Patient not taking: Reported on 08/02/2021) 1 each 0    fluticasone-salmeterol (ADVAIR DISKUS) 250-50 MCG/ACT AEPB Inhale 1 puff into the lungs in the morning and at bedtime. 1 each 6    Hyoscyamine Sulfate SL (LEVSIN/SL) 0.125 MG SUBL Place 1 tablet under the tongue every 4 (four) hours as needed. 60 tablet 0    loratadine (CLARITIN)  10 MG tablet Take 1 tablet (10 mg total) by mouth daily. 30 tablet 6    pantoprazole (PROTONIX) 40 MG tablet Take 1 tablet (40 mg total) by mouth daily. 30 tablet 4    Prenatal Vit-Fe Fumarate-FA (PREPLUS) 27-1 MG TABS Take 1 tablet by mouth daily. 30 tablet 13    promethazine (PHENERGAN) 25 MG tablet Take 1 tablet (25 mg total) by mouth every 6 (six) hours as needed for nausea or vomiting. 30 tablet 1      Review of Systems   All systems reviewed and negative except as stated in HPI  Blood pressure 120/63, pulse 80, temperature 98.5 F (36.9 C), temperature source Oral, resp. rate 16, height '5\' 4"'  (1.626 m), weight 101.7 kg, last menstrual period 02/20/2021. General appearance: alert, cooperative, appears stated age, and no distress Lungs: clear to auscultation bilaterally Heart: regular rate and rhythm Abdomen: soft, non-tender; bowel sounds normal Extremities: Homans sign is negative, no sign of DVT Presentation: cephalic Fetal monitoringBaseline: 125 bpm, Variability: Good {> 6 bpm), Accelerations: Reactive, and Decelerations: Absent Uterine activity: Irregular activity Dilation: 1.5 Effacement (%): 50 Station: -3 Exam by:: Myrtice Lauth, RN   Prenatal labs: ABO, Rh: --/--/B POS (07/10 0045) Antibody: NEG (07/10 0045) Rubella: 5.24 (01/17 1645) RPR: Non Reactive (05/08 1034)  HBsAg: Negative (01/17 1645)  HIV: Non Reactive (05/08 1034)  GBS: Negative/-- (07/03 1603)  2 hr Glucola: Normal Genetic screening:  Normal Anatomy US: Normal  Prenatal Transfer Tool  Maternal Diabetes: No Genetic Screening: Normal Maternal Ultrasounds/Referrals: Normal Fetal Ultrasounds or other Referrals:  Referred to Materal Fetal Medicine  Maternal Substance Abuse:  No Significant Maternal Medications:  None Significant Maternal Lab Results: Group B Strep negative  Results for orders placed or performed during the hospital encounter of 11/08/21 (from the past 24 hour(s))  CBC   Collection  Time: 11/08/21 12:45 AM  Result Value Ref Range   WBC 11.7 (H) 4.0 - 10.5 K/uL   RBC 3.93 3.87 - 5.11 MIL/uL   Hemoglobin 11.5 (L) 12.0 - 15.0 g/dL   HCT 35.1 (L) 36.0 - 46.0 %   MCV 89.3 80.0 - 100.0 fL   MCH 29.3 26.0 - 34.0 pg   MCHC 32.8 30.0 - 36.0 g/dL   RDW 13.4 11.5 - 15.5 %   Platelets 275 150 - 400 K/uL   nRBC 0.0 0.0 - 0.2 %  Type and screen  Collection Time: 11/08/21 12:45 AM  Result Value Ref Range   ABO/RH(D) B POS    Antibody Screen NEG    Sample Expiration      11/11/2021,2359 Performed at [facility], [place] [address] [place], [place] [address]     Patient Active Problem List   Diagnosis Date Noted   Fetal growth restriction antepartum 11/08/2021   IUGR (intrauterine growth restriction) affecting care of mother 09/06/2021   Maternal obesity affecting pregnancy, antepartum 08/16/2021   Supervision of normal pregnancy 04/27/2021   Mild concussion 12/21/2017   H/O chlamydia infection 12/28/2012   Asthma 12/28/2012   Depression 12/28/2012   Marijuana use 12/28/2012    Assessment/Plan:  Lori Lam is a 26 y.o. [id number] at 66w2dhere for IOL for IUGR (6%ile) #Labor:Patient here for IOL for IUGR. Patient cervical exam 1.5/50/-3. Cat I tracing and discussed options for ripening with patient. Patient would like to avoid FB and is opting for Cytotec at this time.  Will reassess in 3-4 hours and assess for a FB vs. AROM, #Pain: Plans epidural #FWB: Category I #ID:  GBS negative #MOF: Breast #MOC: Undecided  #IUGR 6%ile,  prev elev dopplers. Cat I tracing at this time. Discussed with patient regarding possibility of fetal intolerance but reassured that at this time tracing is reassuring.   BHolley Bouche MLasarafor WRome [facility] 11/08/2021, 3:58 AM  GME ATTESTATION:  I saw and evaluated the patient. I agree with the findings and the plan of care as documented in the resident's note and have made all necessary  edits  ARenard Matter MD, MPH OB Fellow, Faculty Practice

## 2021-11-09 MED ORDER — OXYCODONE HCL 5 MG PO TABS
5.0000 mg | ORAL_TABLET | Freq: Four times a day (QID) | ORAL | Status: DC | PRN
  Administered 2021-11-09 (×2): 5 mg via ORAL
  Administered 2021-11-09: 10 mg via ORAL
  Administered 2021-11-10: 5 mg via ORAL
  Administered 2021-11-10: 10 mg via ORAL
  Filled 2021-11-09: qty 1
  Filled 2021-11-09: qty 2
  Filled 2021-11-09: qty 1
  Filled 2021-11-09: qty 2
  Filled 2021-11-09: qty 1

## 2021-11-09 NOTE — Lactation Note (Signed)
This note was copied from a baby's chart. Lactation Consultation Note Mom cramping really bad. Mom latched baby for 2 minutes then stopped. Mom got significant other to feed baby formula. LC discussed mom pumping d/t less than 6 lbs and to get BM to give to baby. At first mom stated she didn't want to pump then she agreed to pump but later d/t cramping so bad at this time. Mom shown how to use DEBP & how to disassemble, clean, & reassemble parts. Mom knows to pump q3h for 15-20 min. Mom encouraged to feed baby 8-12 times/24 hours and with feeding cues.   Mom demonstrated hand expression w/colostrum noted. Praised mom. Baby taking formula well. Pace feeding encouraged. Reported to RN about mom wanting pain medication. Mom will need to be seen again today.  Patient Name: Lori Lam [id number] Date: 11/09/2021 Reason for consult: Initial assessment;Early term 37-38.6wks;Infant < 6lbs Age:24 hours  Maternal Data Has patient been taught Hand Expression?: Yes Does the patient have breastfeeding experience prior to this delivery?: Yes  Feeding Nipple Type: Nfant Slow Flow (purple)  LATCH Score Latch: Too sleepy or reluctant, no latch achieved, no sucking elicited.  Audible Swallowing: None  Type of Nipple: Everted at rest and after stimulation  Comfort (Breast/Nipple): Soft / non-tender  Hold (Positioning): Assistance needed to correctly position infant at breast and maintain latch. (LC advised body alignment)  LATCH Score: 5   Lactation Tools Discussed/Used    Interventions Interventions: Breast feeding basics reviewed;Adjust position;Position options;DEBP;LC Services brochure;LPT handout/interventions  Discharge    Consult Status Consult Status: Follow-up Date: 11/09/21 Follow-up type: In-patient    Charyl Dancer 11/09/2021, 2:51 AM

## 2021-11-09 NOTE — Progress Notes (Signed)
Post Partum Day 1 Subjective: no complaints, up ad lib, voiding and tolerating PO, small lochia, plans to breastfeed, plans to bottle feed,  declines contraception  Objective: Blood pressure 112/65, pulse 76, temperature 98.1 F (36.7 C), temperature source Oral, resp. rate 18, height 5\' 4"  (1.626 m), weight 101.7 kg, last menstrual period 02/20/2021, SpO2 99 %, unknown if currently breastfeeding.  Physical Exam:  General: alert, cooperative and no distress Lochia:normal flow Chest: CTAB Heart: RRR no m/r/g Abdomen: +BS, soft, nontender,  Uterine Fundus: firm DVT Evaluation: No evidence of DVT seen on physical exam. Extremities: trace edema  Recent Labs    11/08/21 0045  HGB 11.5*  HCT 35.1*    Assessment/Plan: Plan for discharge tomorrow, Breastfeeding, and Lactation consult   LOS: 1 day   01/09/22 11/09/2021, 7:25 AM

## 2021-11-09 NOTE — Progress Notes (Signed)
Patient delivered

## 2021-11-09 NOTE — Lactation Note (Signed)
This note was copied from a baby's chart. Lactation Consultation Note  Patient Name: Girl Addilynne Olheiser OIZTI'W Date: 11/09/2021 Reason for consult: Follow-up assessment;Early term 37-38.6wks;Infant < 6lbs Age:28 hours   P3 mother whose infant is now 62 hours old.  This is an early term infant at 37+2 weeks.  Mother's current feeding preference is to exclusively breast feed, however, she is supplementing now with formula due to baby's gestational age and weight.    RN requested assistance.  Baby "Ariel" due to eat; offered to assist and mother receptive.  Taught hand expression; no drops obtained at this time.  Assisted to latch in the cross cradle hold.  Mother's nipples are very large; stressed the importance of a wide mouth prior to latching.  Baby was able to latch and did not show an interest in feeding.  Demonstrated breast compressions and gentle stimulation.  Suggested mother supplement now with formula.  Demonstrated paced bottle feeding using the purple Nfant nipple.  With encouragement and cheek support, "Arilel" was able to consume 10 mls.  She remained calm and paced nicely with this feeding.  Demonstrated burping.  Asked mother to pump immediately after feeding.  Reviewed pump parts, set up and cleaning.  Partner held baby STS while mother pumped.  Mother requested a bigger flange size; provided a #30 for greater comfort.  Encouraged to call for any latching or feeding difficulties.  Reviewed the LPTI policy and volume amounts.  Mother appreciative.  RN updated.     Maternal Data Has patient been taught Hand Expression?: Yes Does the patient have breastfeeding experience prior to this delivery?: Yes How long did the patient breastfeed?: 9 months with her first child  Feeding Mother's Current Feeding Choice: Breast Milk Nipple Type: Nfant Slow Flow (purple)  LATCH Score Latch: Repeated attempts needed to sustain latch, nipple held in mouth throughout feeding, stimulation  needed to elicit sucking reflex.  Audible Swallowing: None  Type of Nipple: Everted at rest and after stimulation (Large nipples)  Comfort (Breast/Nipple): Soft / non-tender  Hold (Positioning): Assistance needed to correctly position infant at breast and maintain latch.  LATCH Score: 6   Lactation Tools Discussed/Used Tools: Pump;Flanges Flange Size: 30 Breast pump type: Double-Electric Breast Pump;Manual Pump Education: Setup, frequency, and cleaning;Milk Storage (Reviewed) Reason for Pumping: Breast stimulation for supplementation for early term < 6 lbs Pumping frequency: Every three hours  Interventions Interventions: Breast feeding basics reviewed;Assisted with latch;Skin to skin;Breast massage;Breast compression;Hand pump;Hand express;Position options;Support pillows;Adjust position;DEBP;Education  Discharge Pump: Hands Free  Consult Status Consult Status: Follow-up Date: 11/10/21 Follow-up type: In-patient    Braheem Tomasik R Naureen Benton 11/09/2021, 9:24 AM

## 2021-11-09 NOTE — Anesthesia Postprocedure Evaluation (Signed)
Anesthesia Post Note  Patient: [name]  Procedure(s) Performed: AN AD HOC LABOR EPIDURAL     Patient location during evaluation: Mother Baby Anesthesia Type: Epidural Level of consciousness: awake and alert Pain management: pain level controlled Vital Signs Assessment: post-procedure vital signs reviewed and stable Respiratory status: spontaneous breathing, nonlabored ventilation and respiratory function stable Cardiovascular status: stable Postop Assessment: no headache, no backache and epidural receding Anesthetic complications: no   No notable events documented.  Last Vitals:  Vitals:   11/09/21 0300 11/09/21 0600  BP: 130/62 112/65  Pulse: 76   Resp: 18 18  Temp: 36.6 C 36.7 C  SpO2: 100% 99%    Last Pain:  Vitals:   11/09/21 0600  TempSrc: Oral  PainSc: 8    Pain Goal:                   Salome Arnt

## 2021-11-09 NOTE — Social Work (Signed)
CSW received consult for hx of THC and anxiety. CSW met with MOB to offer support and complete assessment.   CSW received consult for hx of marijuana use.  Referral was screened out due to the following:  CSW received consult for hx of marijuana use.  Referral was screened out due to the following: ~MOB had no documented substance use after initial prenatal visit/+UPT. ~MOB had no positive drug screens after initial prenatal visit/+UPT.  CSW will monitor CDS results and make report to Child Protective Services if warranted.   CSW met with MOB at bedside to discuss hx of anxiety. CSW introduced role. CSW observed MOB in bed, the infant asleep in the bassinet and "friend" asleep on the couch. MOB gave CSW permission to share information with her friend present at bedside. CSW inquired how MOB has felt since giving birth. MOB reported that she has been feeling "fine and tired." MOB shared that she had an IOL at 37 weeks due to fetal growth restriction. MOB expressed concerns about infant's small weight and size. MOB reported that the that crib that purchase the infant is too big, so she purchased a bassinet to accommodate the baby size. MOB acknowledged that she has a history of anxiety. MOB reported that she cannot recall the year she was diagnosed but that it was "over five years ago." MOB reported that her anxiousness if normal and manageable.  CSW discussed PPD symptoms and provided resources. MOB shared that she had PPD after the birth of her older child and felt fine after the birth of her older child. MOB identified her mom, dad and family as supports.   CSW provided education regarding the baby blues period vs. perinatal mood disorders, discussed treatment and gave resources for mental health follow up if concerns arise.  CSW recommended MOB complete a  self-evaluation during the postpartum time period using the New Mom Checklist from Postpartum Progress and encouraged MOB to contact a medical  professional if symptoms are noted at any time. CSW assessed MOB for safety. MOB denied thoughts of harm to self and others.   MOB reported she has all essential items for the infant including a bassinet where the infant will sleep. CSW provided review of Sudden Infant Death Syndrome (SIDS) precautions. MOB has chosen [facility] for the infant's follow up care. CSW assessed MOB for additional needs. MOB reported no further need.     Kathrin Greathouse, MSW, LCSW Women[facility] and [place] Worker  [phone] 11/09/2021  2:12 PM

## 2021-11-10 ENCOUNTER — Ambulatory Visit: Payer: Self-pay

## 2021-11-10 LAB — GLUCOSE, CAPILLARY: Glucose-Capillary: 75 mg/dL (ref 70–99)

## 2021-11-10 MED ORDER — OXYCODONE HCL 5 MG PO TABS: 5.0000 mg | ORAL_TABLET | Freq: Four times a day (QID) | ORAL | 0 refills | Status: DC | PRN

## 2021-11-10 MED ORDER — IBUPROFEN 600 MG PO TABS: 600.0000 mg | ORAL_TABLET | Freq: Four times a day (QID) | ORAL | 0 refills | Status: DC

## 2021-11-10 MED ORDER — FERROUS SULFATE 325 (65 FE) MG PO TABS: 325.0000 mg | ORAL_TABLET | ORAL | 0 refills | Status: DC

## 2021-11-10 MED ORDER — SENNOSIDES-DOCUSATE SODIUM 8.6-50 MG PO TABS: 2.0000 | ORAL_TABLET | Freq: Every evening | ORAL | 0 refills | Status: AC | PRN

## 2021-11-10 MED ORDER — ACETAMINOPHEN 325 MG PO TABS: 650.0000 mg | ORAL_TABLET | ORAL | 0 refills | Status: DC | PRN

## 2021-11-10 NOTE — Lactation Note (Signed)
This note was copied from a baby's chart. Lactation Consultation Note  Patient Name: Lori Lam [id number] Date: 11/10/2021 Reason for consult: Follow-up assessment;Difficult latch;Early term 37-38.6wks;Infant < 6lbs;Infant weight loss;Breastfeeding assistance (1.83% WL) Age:26 years  P3, Term, Infant Female, 1.83% WL  LC entered the room and baby was asleep on mom's bed. Per mom, baby has been breastfeeding for 10-15 min on each breast. Mom says that baby feeds well, but does fall asleep at the breast.   Mom states that the initial latch is painful, but gets better as baby feeds.   Mom states that her nipples are large and are harder for baby to get into her mouth. LC showed mom how to use reverse pressure to soften the tissue around the nipple to assist with getting the baby on the breast.   Mom says that she has not been pumping due to soreness.   Mom has nipple cream with lanolin. LC encouraged mom to use her nipple cream after feeding in order to help to reduce the soreness. Mom is aware to avoid putting the lanolin inside the flanges when pumping.   LC encouraged mom to pump after feedings to help with increasing her milk volume and for stimulation since baby gets sleepy at the breast.   LC spoke with mom about how to slowly cut down on pumping once her milk volume increases and the pediatrician is happy with baby's weight gain.   Current Feeding Plan:  Breastfeed baby according to feeding cues 8+ times in 24 hours.  Put baby to the breast prior to supplementing.  Supplement according to LPTI guidelines.  Pump for 15 min after feedings.  Call RN/LC for breastfeeding assistance.   Maternal Data    Feeding    LATCH Score                    Lactation Tools Discussed/Used    Interventions Interventions: Breast feeding basics reviewed;Education  Discharge    Consult Status Consult Status: Follow-up Date: 11/11/21 Follow-up type:  In-patient    Orvil Feil Kadance Mccuistion 11/10/2021, 6:08 PM

## 2021-11-10 NOTE — Progress Notes (Signed)
Pt requesting different pain medication. States her pain is 9-10/10 cramping and she has "given up on the pain medicines." Encouraged pt to try alternating tylenol and motrin and using oxycodone if needed. Pt still requesting different pain medication. Joellyn Haff CNM called. No new orders, recommends pt try using tylenol, motrin, and oxycodone. Pt informed and agreeable to try plan.

## 2021-11-11 ENCOUNTER — Ambulatory Visit: Payer: Self-pay

## 2021-11-11 NOTE — Lactation Note (Signed)
This note was copied from a baby's chart. Lactation Consultation Note  Patient Name: Lori Lam [id number] Date: 11/11/2021 Reason for consult: Follow-up assessment;Mother's request;Early term 37-38.6wks;Infant < 6lbs;Breastfeeding assistance Age:28 hours  Mom stated latch painful at first. We talked about ways to ease discomfort in midst of the latch. Mom discontinued use of lanolin. LC recommended coconut oil as a substitute.  Mom currently using 30 flanges with our DEBP. Mom hands free electric pump at home. Mom check to see if they have they have appropriate flange for her personal pump.  Mom working with [facility] for her breastfeeding support. She will check with WIC to get a hospital grade pump and use the manual until she get can bigger flange size for her personal.   We reviewed LPTI guidelines. Mom feeding cues 8-12x 24hr period, supplement with EBM (getting 30 ml per pumping session) and then post pump with manual after each feeding for 10 min each breast.  Mom aware her volume of pumped milk is sufficient and will check with Pediatrician to see if extra calorie required, if not just offer her pumped milk  All questions answered at the end of the visit  Maternal Data Has patient been taught Hand Expression?: Yes  Feeding Mother's Current Feeding Choice: Breast Milk and Formula  LATCH Score                    Lactation Tools Discussed/Used Tools: Pump;Flanges Flange Size: 30 Breast pump type: Double-Electric Breast Pump Pump Education: Setup, frequency, and cleaning;Milk Storage Reason for Pumping: increase stimulation Pumping frequency: post pump after each feeding for 15 mins  Interventions Interventions: Breast feeding basics reviewed;Hand express;Expressed milk;DEBP;Education;Pace feeding;LC Services brochure;Infant Driven Feeding Algorithm education;LPT handout/interventions  Discharge Discharge Education: Engorgement and breast care;Warning signs for  feeding baby Pump: Hands Free;Personal;DEBP  Consult Status Consult Status: Complete Date: 11/11/21    Zakarie Sturdivant  Nicholson-Springer 11/11/2021, 12:15 PM

## 2021-11-15 ENCOUNTER — Encounter: Payer: Medicaid Other | Admitting: Obstetrics and Gynecology

## 2021-11-16 ENCOUNTER — Telehealth (HOSPITAL_COMMUNITY): Payer: Self-pay | Admitting: *Deleted

## 2021-11-16 NOTE — Telephone Encounter (Signed)
Mom reports feeling good. No concerns about herself at this time. EPDS=0 [facility] score not found) Mom reports baby is doing well. Feeding, peeing, and pooping without difficulty. Safe sleep reviewed. Mom reports no concerns about baby at present.  Duffy Rhody, RN 11-16-2021 at 2;10pm

## 2021-11-22 ENCOUNTER — Encounter: Payer: Medicaid Other | Admitting: Obstetrics and Gynecology

## 2021-11-23 ENCOUNTER — Telehealth: Payer: Self-pay | Admitting: General Practice

## 2021-11-23 DIAGNOSIS — B3789 Other sites of candidiasis: Secondary | ICD-10-CM

## 2021-11-23 MED ORDER — NYSTATIN 100000 UNIT/GM EX CREA: 1.0000 | TOPICAL_CREAM | CUTANEOUS | 0 refills | Status: DC | PRN

## 2021-11-23 NOTE — Telephone Encounter (Signed)
Patient called into front office stating she thinks she has an infection in her nipple. Patient states she was using a cream on her nipples before she delivered and some in the hospital and was then told it causes yeast so she stopped using it. Patient reports nipple/areolar pain. She states she has had the pain for 3 days and the pain feels sharp like a knife and rates it a 8-9. She states the pain is worse in her left nipple. Patient states her right nipple is red but the left one looks like there is a film over it. Patient states she has stopped pumping because it hurts so bad. Patient reports pumping 2-3 times a day before this and breastfeeding 1-2 times a day. She reports she is mostly formula feeding because they told her the baby needed higher calories. Discussed Rx for nystatin sent to pharmacy as well as instructions. Also discussed I would send a mychart message with additional information for her to read/review. Discussed with patient importance of coming in for a lactation appt and reviewed appt information. Scheduled appt for tomorrow 7/26 @ 815. Patient verbalized understanding and asked about changing baby's formula because the baby has been upset/gassy today. Told patient no, she will ultimately need to talk to her pediatrician about that but the lactation consultant can talk to her more about that tomorrow. Patient verbalized understanding.

## 2021-11-29 ENCOUNTER — Encounter: Payer: Medicaid Other | Admitting: Obstetrics and Gynecology

## 2021-12-08 ENCOUNTER — Other Ambulatory Visit: Payer: Self-pay

## 2021-12-08 ENCOUNTER — Encounter: Payer: Self-pay | Admitting: Obstetrics and Gynecology

## 2021-12-08 ENCOUNTER — Ambulatory Visit (INDEPENDENT_AMBULATORY_CARE_PROVIDER_SITE_OTHER): Payer: Medicaid Other | Admitting: Obstetrics and Gynecology

## 2021-12-08 VITALS — BP 118/82 | HR 83 | Wt 222.0 lb

## 2021-12-08 DIAGNOSIS — Z30017 Encounter for initial prescription of implantable subdermal contraceptive: Secondary | ICD-10-CM | POA: Diagnosis not present

## 2021-12-08 HISTORY — PX: INSERTION OF IMPLANON ROD: OBO 1005

## 2021-12-08 LAB — POCT PREGNANCY, URINE: Preg Test, Ur: NEGATIVE

## 2021-12-08 MED ORDER — ETONOGESTREL 68 MG ~~LOC~~ IMPL
68.0000 mg | DRUG_IMPLANT | Freq: Once | SUBCUTANEOUS | Status: AC
  Administered 2021-12-08: 68 mg via SUBCUTANEOUS

## 2021-12-08 NOTE — Procedures (Signed)
Nexplanon Insertion Procedure Note Prior to the procedure being performed, the patient (or guardian) was asked to state their full name, date of birth, type of procedure being performed and the exact location of the operative site. This information was then checked against the documentation in the patient's chart. Prior to the procedure being performed, a "time out" was performed by the physician that confirmed the correct patient, procedure and site.  After informed consent was obtained, the patient's non-dominant left arm was chosen for insertion at the prior insertion sites. A site was marked 8 cm proximal to the medial epicondyle in the sulcus between the biceps and triceps on the inner surface. The area was cleaned with alcohol then local anesthesia was infiltrated with 3 ml of 1% lidocaine epinephrine along the planned insertion track. The area was prepped with betadine. Using sterile technique the Nexplanon device was inserted per manufacturer's guidelines in the subdermal connective tissue using the standard insertion technique without difficulty. Pressure was applied and the insertion site was hemostatic. The presence of the Nexplanon was confirmed immediately after insertion by palpation by both me and the patient and by checking the tip of needle for the absence of the insert.  A pressure dressing was applied.  Lot #: [id number] Exp: [id number]  The patient tolerated the procedure well.  Cornelia Copa MD Attending Center for [employer] [facility])

## 2021-12-08 NOTE — Progress Notes (Signed)
    Post Partum Visit Note  Lori Lam is a 26 y.o. [id number] s/p 7/10 SVD/intact perineum at 37wks with pregnancy c/b FGR. Anesthesia: epidural. Postpartum course has been uncomplicated. Baby recently just got out of the hospital b/c was readmitted for meningitis.[name] is feeding by both breast and bottle - Similac Neosure. Bleeding no bleeding. Bowel function is normal. Bladder function is normal. Patient is sexually active (two days ago with condom). Contraception method is condoms. Postpartum depression screening: negative.    Edinburgh Postnatal Depression Scale - 12/08/21 1332       Edinburgh Postnatal Depression Scale:  In the Past 7 Days   I have been able to laugh and see the funny side of things. 0    I have looked forward with enjoyment to things. 0    I have blamed myself unnecessarily when things went wrong. 0    I have been anxious or worried for no good reason. 0    I have felt scared or panicky for no good reason. 0    Things have been getting on top of me. 0    I have been so unhappy that I have had difficulty sleeping. 0    I have felt sad or miserable. 0    I have been so unhappy that I have been crying. 0    The thought of harming myself has occurred to me. 0    Edinburgh Postnatal Depression Scale Total 0             Review of Systems Pertinent items noted in HPI and remainder of comprehensive ROS otherwise negative.  Objective:  BP 118/82   Pulse 83   Wt 222 lb (100.7 kg)   LMP 02/20/2021 (Exact Date)   Breastfeeding Yes   BMI 38.11 kg/m    General: NAD  Labs: UPT neg Assessment:   Normal PP visit  Plan:  *PP: routine care. Pt has used nexplanon before and would like it again; she's had two prior ones She states she thinks it may have caused recurrent vaginitis and d/c in the past, but I told her that would be an unusual side effect, but something we can keep an eye on. Pap UTD 2023  Pt told to consider it effective in 5 days  RTC  PRN  [place] Bing, MD Center for [facility], [facility]

## 2021-12-08 NOTE — Addendum Note (Signed)
Addended by: Guy Begin on: 12/08/2021 02:35 PM   Modules accepted: Orders

## 2022-01-23 IMAGING — DX DG KNEE COMPLETE 4+V*R*
5 series · 5 of 5 positions shown · non-contrast
Comparison: None.

CLINICAL DATA: Right-sided knee pain

EXAM:
RIGHT KNEE - COMPLETE 4+ VIEW

[knee ap]
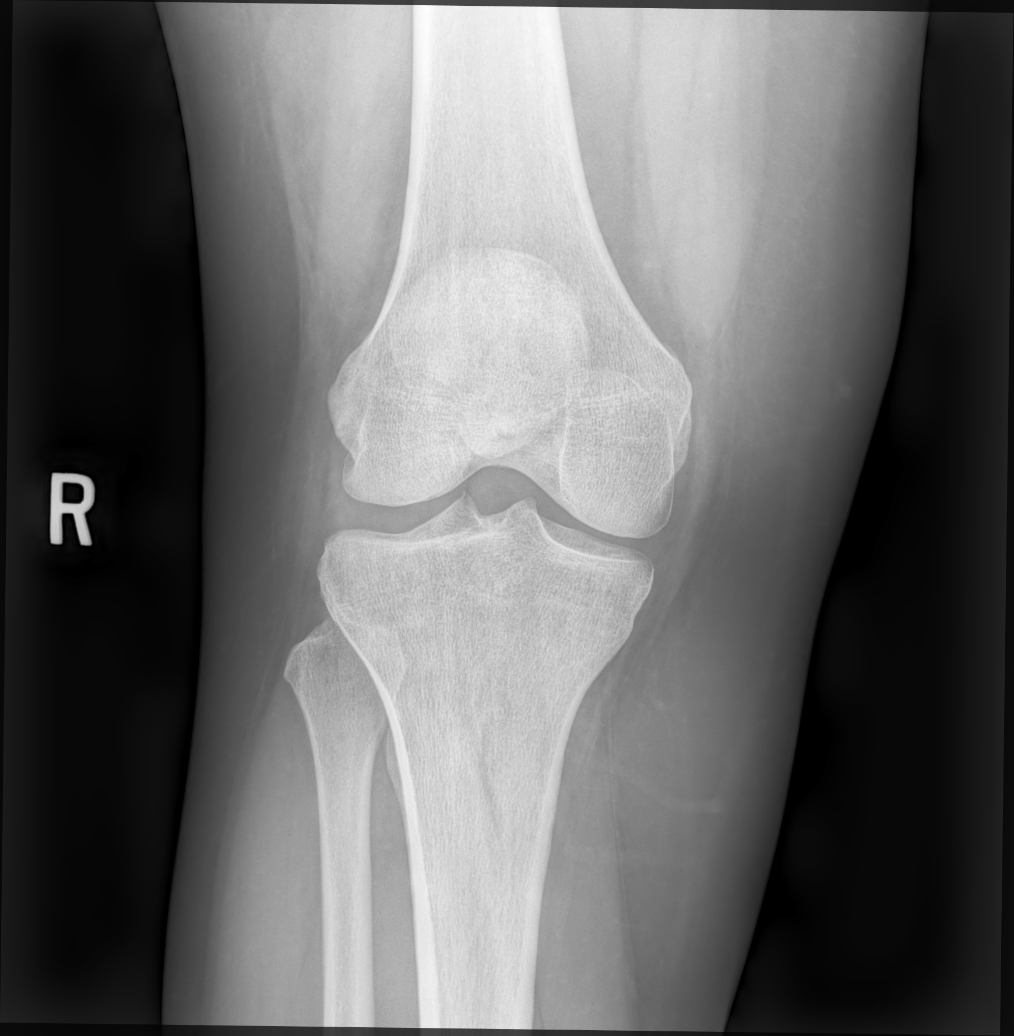

[knee mlo]
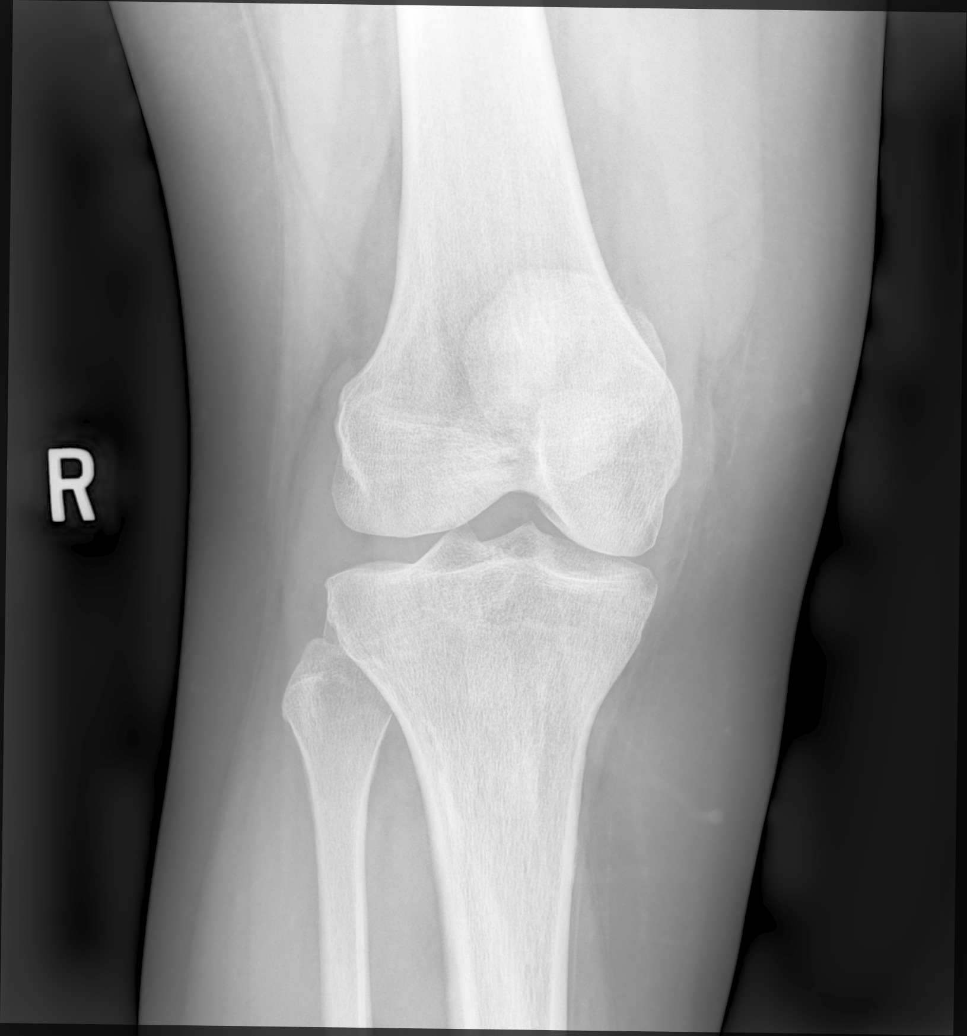

[knee lmo]
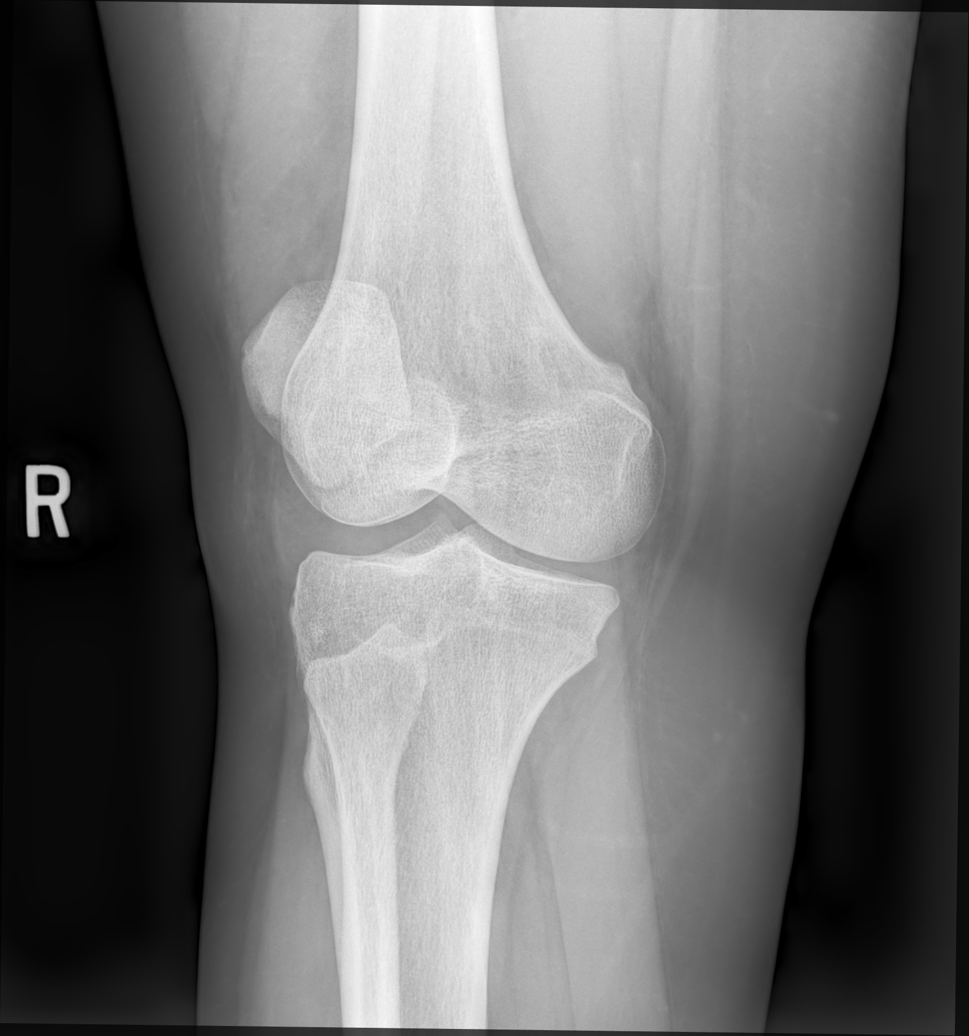

[knee lat]
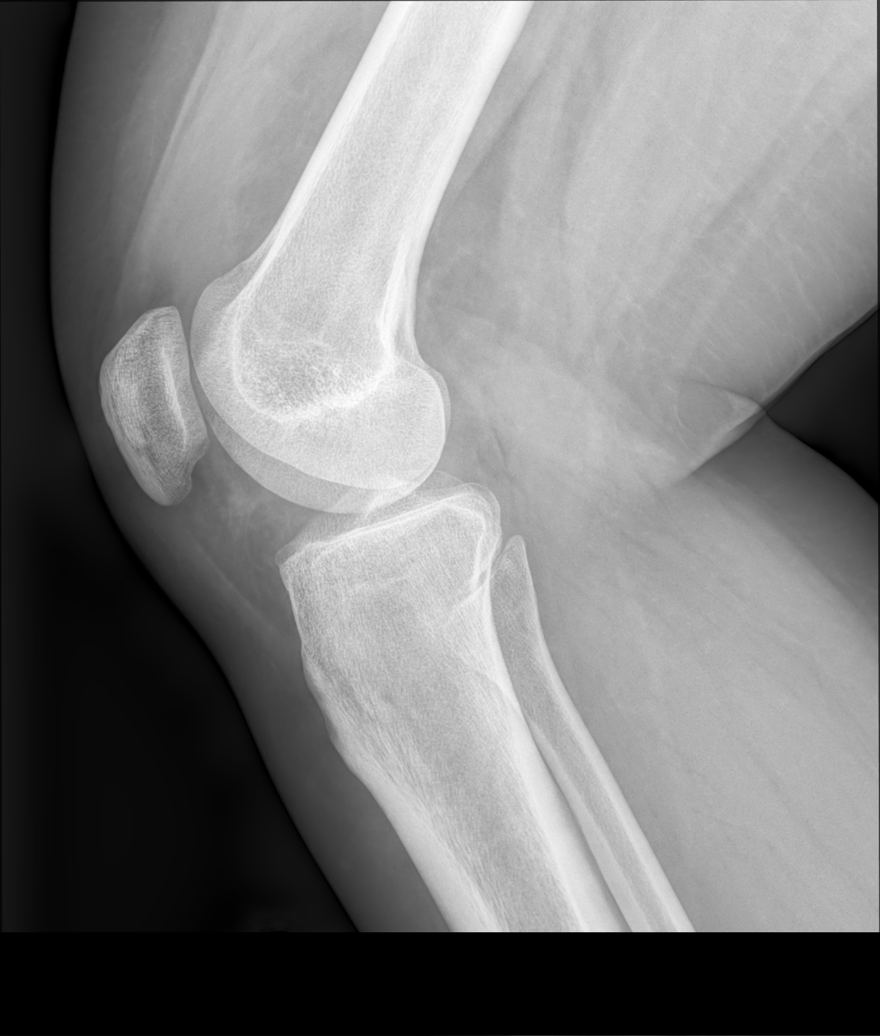

[patella tangential]
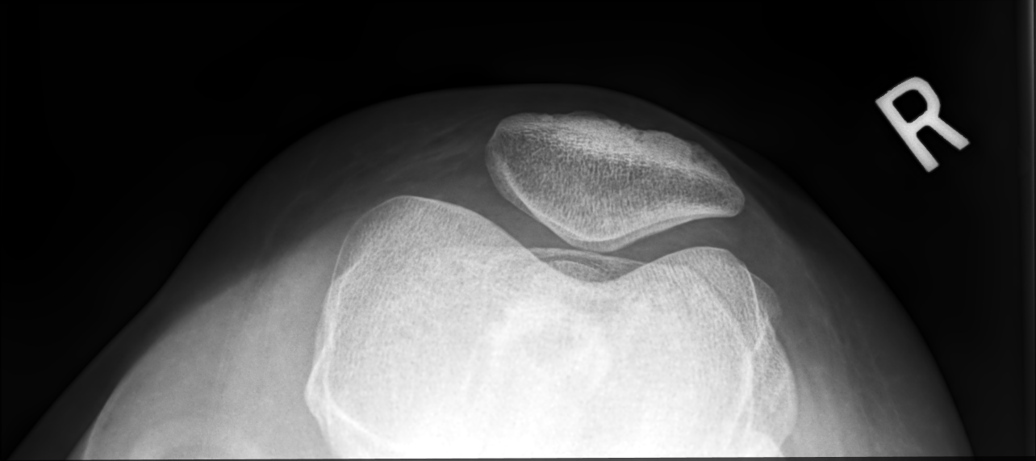

[5 of 5 positions shown; findings below may reference images not displayed]

FINDINGS: No fracture or malalignment. Joint spaces are patent. Moderate knee
effusion.
IMPRESSION: Moderate knee effusion

## 2022-04-11 ENCOUNTER — Encounter: Payer: Self-pay | Admitting: *Deleted

## 2022-08-17 IMAGING — US US MFM OB DETAIL+14 WK
1 series · 13 of 28 positions shown · non-contrast
Comparison: none

[Series 1: us mfm ob detail+14 wk · 146 acquisitions, 13 frames shown]
[im 6/146]
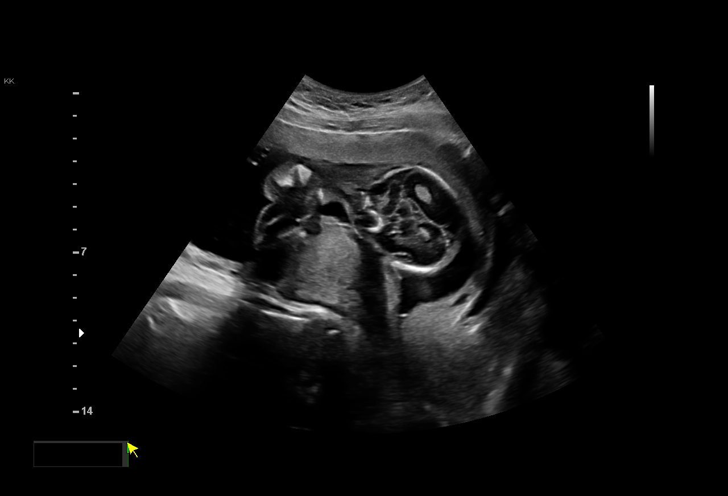
[im 17/146]
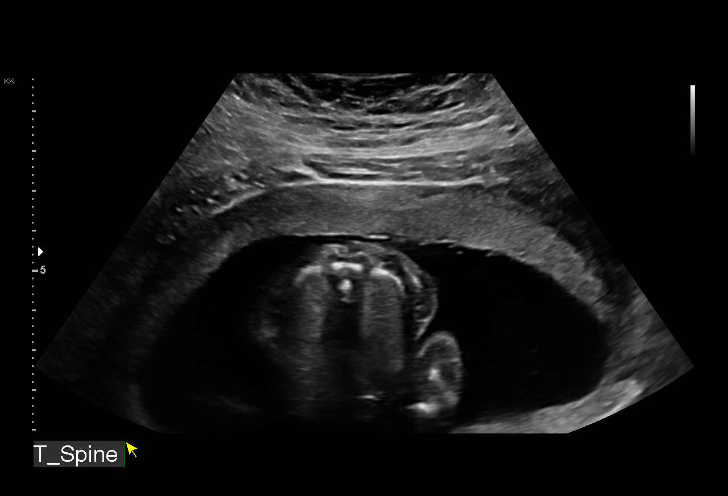
[im 27/146]
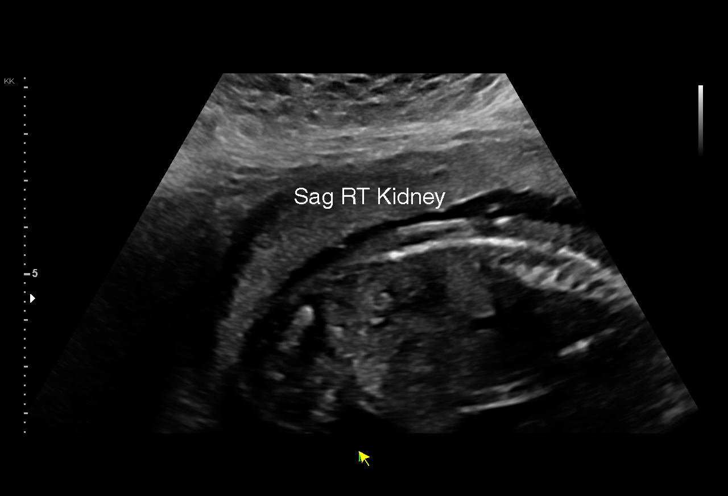
[im 38/146]
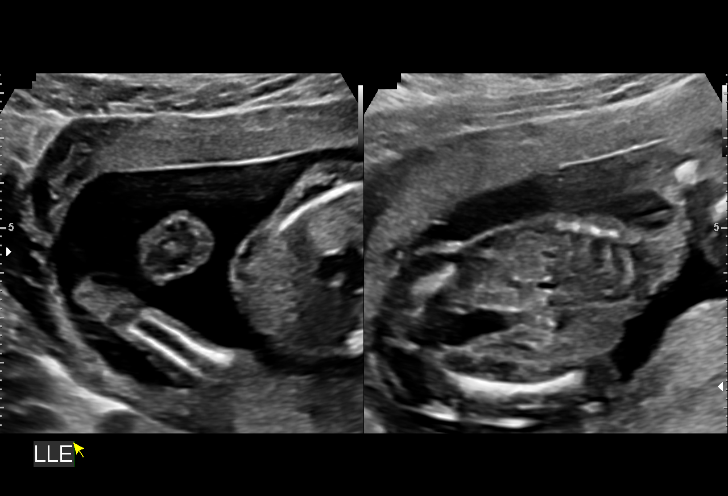
[im 49/146]
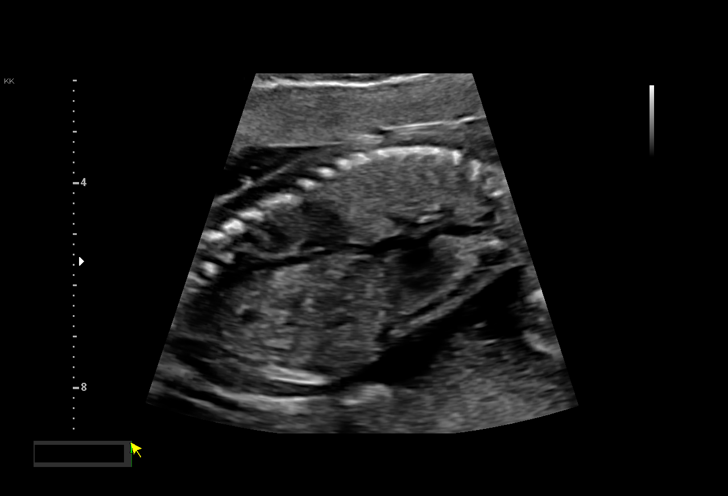
[im 60/146]
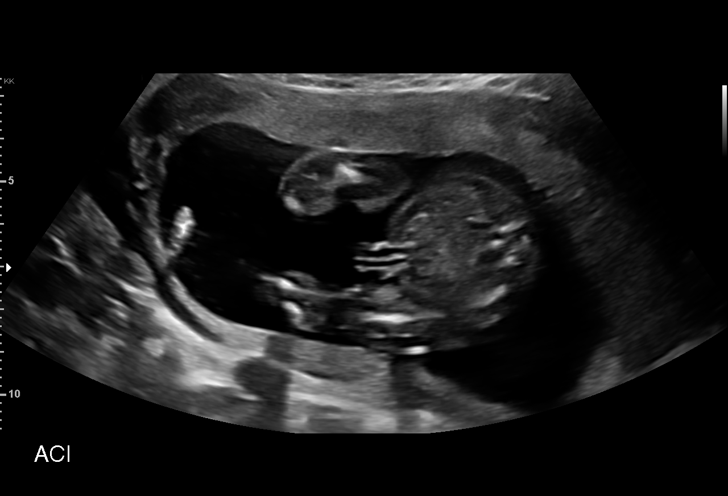
[im 76/146]
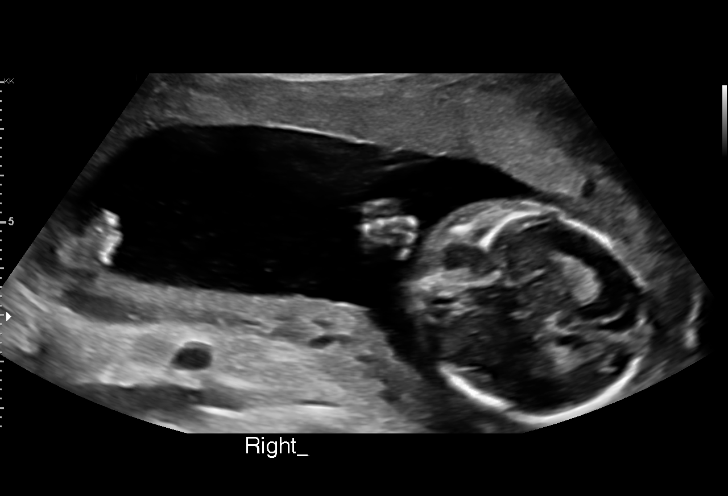
[im 86/146]
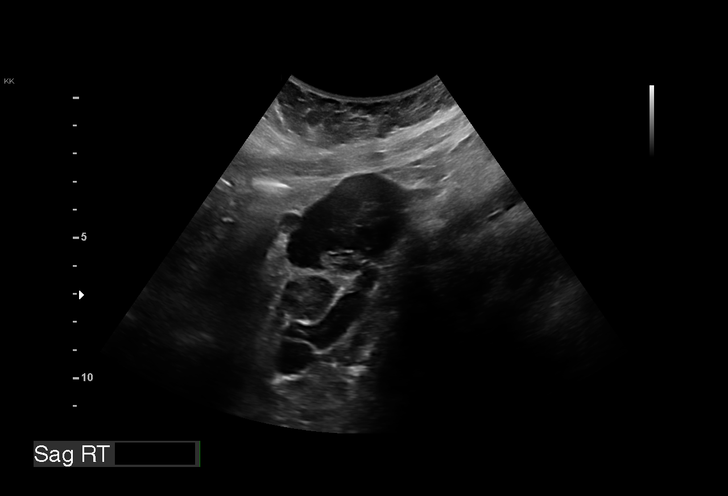
[im 97/146]
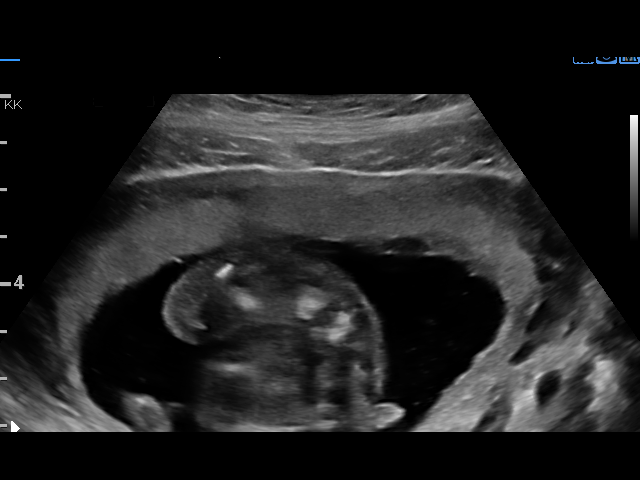
[im 108/146]
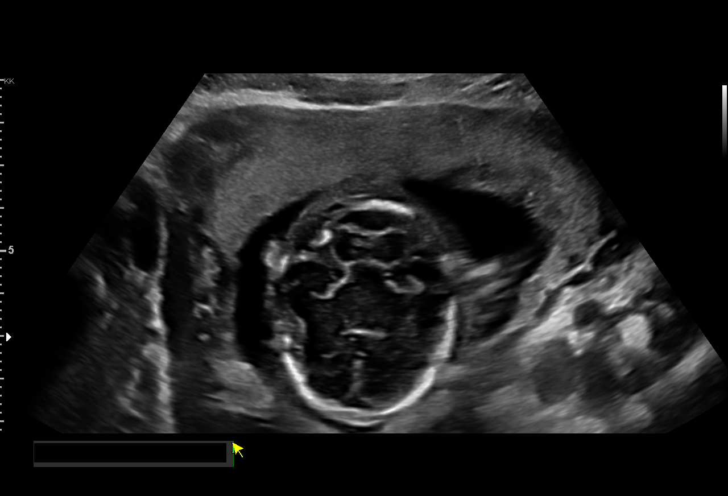
[im 119/146]
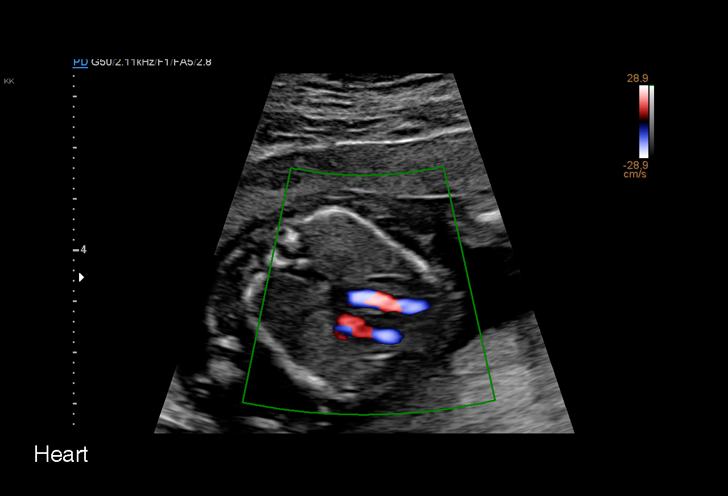
[im 129/146]
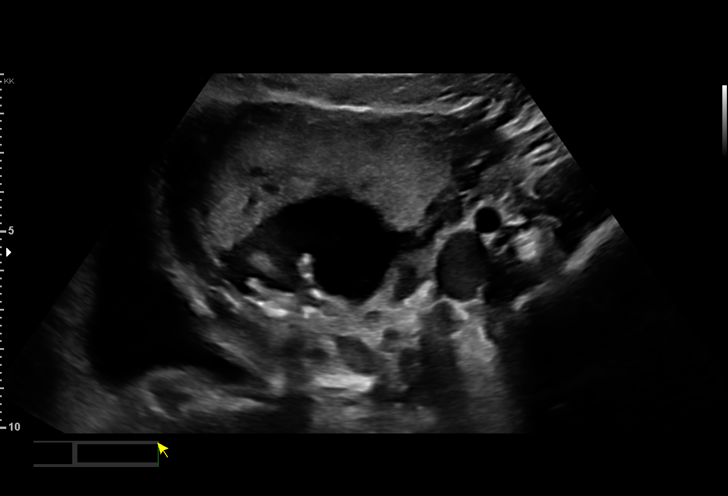
[im 140/146]
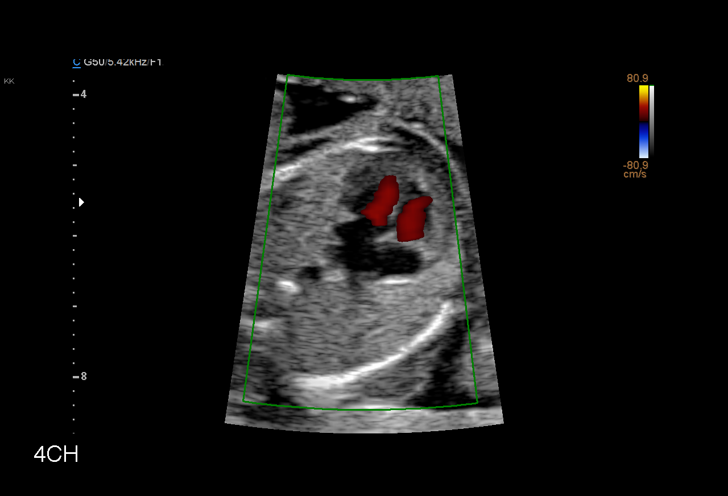

[13 of 28 positions shown; findings below may reference images not displayed]

Name:       SARANYA POIRIER              Visit Date: 07/05/2021 [REDACTED]

Indications

 Obesity complicating pregnancy, second
 trimester (BMI 33)
 Encounter for antenatal screening for
 malformations
 Asthma                                         RTT.2T [id number]
 Low Risk NIPS(Negative AFP); neg Horizon
 screening
 19 weeks gestation of pregnancy
Fetal Evaluation

 Num Of Fetuses:         1
 Fetal Heart Rate(bpm):  157
 Cardiac Activity:       Observed
 Presentation:           Cephalic
 Placenta:               Anterior
 P. Cord Insertion:      Visualized

 Amniotic Fluid
 AFI FV:      Within normal limits

                             Largest Pocket(cm)

Biometry
 BPD:      43.5  mm     G. Age:  19w 1d         51  %    CI:        78.59   %    70 - 86
                                                         FL/HC:      17.8   %    16.1 -
 HC:      155.2  mm     G. Age:  18w 3d         14  %    HC/AC:      1.09        1.09 -
 AC:      142.4  mm     G. Age:  19w 4d         61  %    FL/BPD:     63.4   %
 FL:       27.6  mm     G. Age:  18w 3d         19  %    FL/AC:      19.4   %    20 - 24
 HUM:      27.6  mm     G. Age:  18w 6d         42  %
 CER:      18.9  mm     G. Age:  18w 4d         16  %
 NFT:       3.5  mm

 LV:        6.6  mm
 CM:        4.5  mm

 Est. FW:     269  gm      0 lb 9 oz     37  %
OB History

 Gravidity:    4         Term:   2        Prem:   0        SAB:   0
 TOP:          1       Ectopic:  0        Living: 2
Gestational Age

 LMP:           19w 2d        Date:  [DATE]                 EDD:   02/20/21
 U/S Today:     18w 6d                                        EDD:   11/27/21
 Best:          19w 1d     Det. By:  U/S C R L  (11/30/21)    EDD:   04/27/21
Anatomy

 Cranium:               Appears normal         Aortic Arch:            Appears normal
 Cavum:                 Appears normal         Ductal Arch:            Appears normal
 Ventricles:            Appears normal         Diaphragm:              Appears normal
 Choroid Plexus:        Appears normal         Stomach:                Appears normal, left
                                                                       sided
 Cerebellum:            Appears normal         Abdomen:                Appears normal
 Posterior Fossa:       Appears normal         Abdominal Wall:         Appears nml (cord
                                                                       insert, abd wall)
 Nuchal Fold:           Appears normal         Cord Vessels:           Appears normal (3
                                                                       vessel cord)
 Face:                  Orbits appear          Kidneys:                Appear normal
                        normal
 Lips:                  Appears normal         Bladder:                Appears normal
 Thoracic:              Appears normal         Spine:                  Appears normal
 Heart:                 Not well visualized    Upper Extremities:      Appears normal
 RVOT:                  Appears normal         Lower Extremities:      Appears normal
 LVOT:                  Appears normal

 Other:  Fetus appears to be female. Heels, Hands, and VC visualized.
         Technically difficult due to maternal habitus and fetal position.
Cervix Uterus Adnexa

 Cervix
 Length:           3.73  cm.
 Normal appearance by transabdominal scan.

 Right Ovary
 Within normal limits.
 Left Ovary
 Within normal limits.

 Adnexa
 No abnormality visualized.
Impression

 G4 P2. Patient is here for fetal anatomy scan.
 On cell-free fetal DNA screening, the risks of fetal
 aneuploidies are not increased .MSAFP screening showed
 low risk for open-neural tube defects .

 Obstetric history significant for 2 term vaginal deliveries.

 We performed fetal anatomy scan. No makers of
 aneuploidies or fetal structural defects are seen. Fetal
 biometry is consistent with her previously-established dates.
 Amniotic fluid is normal and good fetal activity is seen.
 Patient understands the limitations of ultrasound in detecting
 fetal anomalies.
Recommendations

 -An appointment was made for her to return in 4 weeks for
 completion of fetal anatomy (profile and 4 chamber).
                 Haag, Anselmo

## 2022-10-31 ENCOUNTER — Ambulatory Visit
Admission: EM | Admit: 2022-10-31 | Discharge: 2022-10-31 | Disposition: A | Discharge status: Home / Self Care | Payer: MEDICAID | Attending: Family Medicine | Admitting: Family Medicine

## 2022-10-31 ENCOUNTER — Encounter: Payer: Self-pay | Admitting: *Deleted

## 2022-10-31 ENCOUNTER — Other Ambulatory Visit: Payer: Self-pay

## 2022-10-31 DIAGNOSIS — B3731 Acute candidiasis of vulva and vagina: Secondary | ICD-10-CM | POA: Diagnosis not present

## 2022-10-31 DIAGNOSIS — N760 Acute vaginitis: Secondary | ICD-10-CM | POA: Diagnosis present

## 2022-10-31 MED ORDER — FLUCONAZOLE 150 MG PO TABS: 150.0000 mg | ORAL_TABLET | ORAL | 0 refills | Status: AC

## 2022-10-31 NOTE — ED Provider Notes (Signed)
[name]    CSN: [id number] Arrival date & time: 10/31/22  0810      History   Chief Complaint Chief Complaint  Patient presents with   Vaginal Discharge    HPI Lori Lam is a 27 y.o. female.    Vaginal Discharge  Here for white cottage cheese thick vaginal discharge.  Began about 5 or 6 days ago.  She is having some itching also.  She was begun on amoxicillin after she had some dental work done about 7 days ago.  No dysuria or fever or chills.  Last menstrual cycle was about 2 months ago; she has the Nexplanon  Then she mentions that she is felt sort of tired since she has been on the antibiotics.  She maybe had some cough when she had her dental work done, but not since.  Past Medical History:  Diagnosis Date   Anemia    Anxiety    Asthma    Bacterial vaginitis    Chlamydia contact, treated    GERD (gastroesophageal reflux disease)    H/O chlamydia infection 12/28/2012   Treated again 08/2015 Negative 08/30/16   Mild concussion 12/21/2017    Patient Active Problem List   Diagnosis Date Noted   BMI 38.0-38.9,adult 08/16/2021   Asthma 12/28/2012   Depression 12/28/2012   Marijuana use 12/28/2012    Past Surgical History:  Procedure Laterality Date   HERNIA REPAIR     INSERTION OF IMPLANON ROD  12/08/2021    OB History     Gravida  4   Para  3   Term  3   Preterm  0   AB  1   Living  3      SAB  0   IAB  1   Ectopic  0   Multiple  0   Live Births  3            Home Medications    Prior to Admission medications   Medication Sig Start Date End Date Taking? Authorizing Provider  albuterol (VENTOLIN HFA) 108 (90 Base) MCG/ACT inhaler Inhale 2 puffs into the lungs every 6 (six) hours as needed for wheezing or shortness of breath.   Yes [provider]  fluconazole (DIFLUCAN) 150 MG tablet Take 1 tablet (150 mg total) by mouth every 3 (three) days for 2 doses. 10/31/22 11/04/22 Yes Zenia Resides, MD   fluticasone-salmeterol (ADVAIR DISKUS) 250-50 MCG/ACT AEPB Inhale 1 puff into the lungs in the morning and at bedtime. 09/05/21  Yes Smith, [place], CNM  etonogestrel (NEXPLANON) 68 MG IMPL implant 1 each by Subdermal route once. 12/08/21   [provider]  ferrous sulfate 325 (65 FE) MG tablet Take 1 tablet (325 mg total) by mouth every other day. 11/11/21 01/10/22  Warner Mccreedy, MD  loratadine (CLARITIN) 10 MG tablet Take 1 tablet (10 mg total) by mouth daily. Patient taking differently: Take 10 mg by mouth daily as needed for allergies. 09/06/21   Katrinka Blazing, [place], CNM  fluticasone (FLONASE) 50 MCG/ACT nasal spray Place 1 spray into both nostrils daily. 12/07/19 05/22/20  Georgetta Haber, NP    Family History Family History  Problem Relation Age of Onset   Hypertension Mother    Hypertension Father    Hypertension Maternal Grandmother    Hypertension Maternal Grandfather    Hypertension Paternal Grandmother    Hypertension Other    Diabetes Other     Social History Social History   Tobacco  Use   Smoking status: Former    Packs/day: .25    Types: Cigarettes    Quit date: 03/2021    Years since quitting: 1.6   Smokeless tobacco: Never   Tobacco comments:    2 cigs/day  Vaping Use   Vaping Use: Never used  Substance Use Topics   Alcohol use: No    Comment: socially, not since confirmed pregnancy   Drug use: No     Allergies   Patient has no known allergies.   Review of Systems Review of Systems  Genitourinary:  Positive for vaginal discharge.     Physical Exam Triage Vital Signs ED Triage Vitals  Enc Vitals Group     BP 10/31/22 0824 124/86     Pulse Rate 10/31/22 0824 70     Resp 10/31/22 0824 18     Temp 10/31/22 0824 98.6 F (37 C)     Temp src --      SpO2 10/31/22 0824 96 %     Weight --      Height --      Head Circumference --      Peak Flow --      Pain Score 10/31/22 0821 0     Pain Loc --      Pain Edu? --      Excl. in GC? --    No data  found.  Updated Vital Signs BP 124/86   Pulse 70   Temp 98.6 F (37 C)   Resp 18   SpO2 96%   Breastfeeding No   Visual Acuity Right Eye Distance:   Left Eye Distance:   Bilateral Distance:    Right Eye Near:   Left Eye Near:    Bilateral Near:     Physical Exam Vitals reviewed.  Constitutional:      General: She is not in acute distress.    Appearance: She is not ill-appearing, toxic-appearing or diaphoretic.  HENT:     Mouth/Throat:     Mouth: Mucous membranes are moist.  Eyes:     Extraocular Movements: Extraocular movements intact.     Conjunctiva/sclera: Conjunctivae normal.     Pupils: Pupils are equal, round, and reactive to light.  Cardiovascular:     Rate and Rhythm: Normal rate and regular rhythm.     Heart sounds: No murmur heard. Pulmonary:     Effort: No respiratory distress.     Breath sounds: No stridor. No wheezing, rhonchi or rales.  Abdominal:     Palpations: Abdomen is soft.  Musculoskeletal:     Cervical back: Neck supple.  Lymphadenopathy:     Cervical: No cervical adenopathy.  Skin:    Coloration: Skin is not pale.  Neurological:     General: No focal deficit present.     Mental Status: She is alert and oriented to person, place, and time.  Psychiatric:        Behavior: Behavior normal.      UC Treatments / Results  Labs (all labs ordered are listed, but only abnormal results are displayed) Labs Reviewed  CERVICOVAGINAL ANCILLARY ONLY    EKG   Radiology No results found.  Procedures Procedures (including critical care time)  Medications Ordered in UC Medications - No data to display  Initial Impression / Assessment and Plan / UC Course  I have reviewed the triage vital signs and the nursing notes.  Pertinent labs & imaging results that were available during my care of the patient were  reviewed by me and considered in my medical decision making (see chart for details).        Vaginal self swab is done today.   Will notify her and treat protocol any positives.  I have gone ahead and sent in fluconazole to treat probable yeast infection.  She had already purchased an over-the-counter treatment and will finish her third day today.  She declined my offer of HIV and syphilis testing  If her malaise or fatigue does not improve after she is off antibiotics I discussed with her to be seen again for consideration of some lab work.  She would not see a new PCP until October Final Clinical Impressions(s) / UC Diagnoses   Final diagnoses:  Acute vaginitis     Discharge Instructions      Take fluconazole 150 mg--1 tablet every 3 days for 2 doses  Staff will notify you if there is anything positive on your swab.       ED Prescriptions     Medication Sig Dispense Auth. Provider   fluconazole (DIFLUCAN) 150 MG tablet Take 1 tablet (150 mg total) by mouth every 3 (three) days for 2 doses. 2 tablet Marlinda Mike Janace Aris, MD      I have reviewed the PDMP during this encounter.   Zenia Resides, MD 10/31/22 [phone]

## 2022-10-31 NOTE — Discharge Instructions (Signed)
Take fluconazole 150 mg--1 tablet every 3 days for 2 doses  Staff will notify you if there is anything positive on your swab.

## 2022-10-31 NOTE — ED Triage Notes (Signed)
Pt reports a thick,white  Vag discharge for for approx one week. Pt is taking Amoxicillin for dental dental work she had done one week ago.

## 2022-11-01 LAB — CERVICOVAGINAL ANCILLARY ONLY
Bacterial Vaginitis (gardnerella): POSITIVE — AB
Candida Glabrata: NEGATIVE
Candida Vaginitis: POSITIVE — AB
Chlamydia: NEGATIVE
Comment: NEGATIVE
Comment: NEGATIVE
Comment: NEGATIVE
Comment: NEGATIVE
Comment: NEGATIVE
Comment: NORMAL
Neisseria Gonorrhea: NEGATIVE
Trichomonas: NEGATIVE

## 2022-11-02 ENCOUNTER — Telehealth (HOSPITAL_COMMUNITY): Payer: Self-pay | Admitting: Emergency Medicine

## 2022-11-02 MED ORDER — METRONIDAZOLE 500 MG PO TABS: 500.0000 mg | ORAL_TABLET | Freq: Two times a day (BID) | ORAL | 0 refills | Status: DC

## 2022-12-06 ENCOUNTER — Encounter: Payer: Self-pay | Admitting: Obstetrics & Gynecology

## 2022-12-06 ENCOUNTER — Ambulatory Visit: Payer: MEDICAID | Admitting: Obstetrics & Gynecology

## 2022-12-06 ENCOUNTER — Other Ambulatory Visit: Payer: Self-pay

## 2022-12-06 VITALS — BP 125/78 | HR 77 | Wt 254.0 lb

## 2022-12-06 DIAGNOSIS — Z3046 Encounter for surveillance of implantable subdermal contraceptive: Secondary | ICD-10-CM

## 2022-12-06 NOTE — Progress Notes (Signed)
Patient wants Nexplanon removed today. Does not want any other form of contraception.

## 2022-12-06 NOTE — Progress Notes (Signed)
GYNECOLOGY OFFICE PROCEDURE NOTE  Lori Lam is a 27 y.o. [phone] here for Nexplanon removal.  Last pap smear was on 05/2021 and was normal.  No other gynecologic concerns.   Nexplanon Removal Patient identified, informed consent performed, consent signed.   Appropriate time out taken. Nexplanon site identified.  Area prepped in usual sterile fashon. One ml of 1% lidocaine was used to anesthetize the area at the distal end of the implant. A small stab incision was made right beside the implant on the distal portion.  The Nexplanon rod was grasped using hemostats and removed without difficulty.  There was minimal blood loss. There were no complications.  3 ml of 1% lidocaine was injected around the incision for post-procedure analgesia.  Steri-strips were applied over the small incision.  A pressure bandage was applied to reduce any bruising.  The patient tolerated the procedure well and was given post procedure instructions.  Patient is planning to use nothing for contraception     Adam Phenix, MD Attending Obstetrician & Gynecologist, [facility] and Center for [facility]  12/06/2022

## 2023-01-03 ENCOUNTER — Other Ambulatory Visit: Payer: Self-pay

## 2023-01-03 ENCOUNTER — Ambulatory Visit: Payer: MEDICAID

## 2023-01-03 ENCOUNTER — Encounter: Payer: Self-pay | Admitting: Emergency Medicine

## 2023-01-03 ENCOUNTER — Ambulatory Visit
Admission: EM | Admit: 2023-01-03 | Discharge: 2023-01-03 | Disposition: A | Discharge status: Home / Self Care | Payer: MEDICAID | Attending: Internal Medicine | Admitting: Internal Medicine

## 2023-01-03 DIAGNOSIS — M25572 Pain in left ankle and joints of left foot: Secondary | ICD-10-CM | POA: Diagnosis not present

## 2023-01-03 MED ORDER — KETOROLAC TROMETHAMINE 30 MG/ML IJ SOLN
30.0000 mg | Freq: Once | INTRAMUSCULAR | Status: AC
  Administered 2023-01-03: 30 mg via INTRAMUSCULAR

## 2023-01-03 NOTE — ED Triage Notes (Signed)
Pt here for left ankle pain after twisting when stepped into a hole last night; swelling noted

## 2023-01-03 NOTE — ED Provider Notes (Signed)
[name]    CSN: [id number] Arrival date & time: 01/03/23  0815      History   Chief Complaint Chief Complaint  Patient presents with   Ankle Pain    HPI Lori Lam is a 27 y.o. female.   Patient presents with left ankle pain after injury that occurred yesterday.  Patient reports that she was walking when she accidentally stepped in a hole twisting her ankle over and then subsequently stepping on her ankle.  Denies any foot pain, numbness, tingling.  She has taken Tylenol for pain with minimal improvement.  Reports pain with bearing weight.   Ankle Pain   Past Medical History:  Diagnosis Date   Anemia    Anxiety    Asthma    Bacterial vaginitis    Chlamydia contact, treated    GERD (gastroesophageal reflux disease)    H/O chlamydia infection 12/28/2012   Treated again 08/2015 Negative 08/30/16   Mild concussion 12/21/2017    Patient Active Problem List   Diagnosis Date Noted   BMI 38.0-38.9,adult 08/16/2021   Asthma 12/28/2012   Depression 12/28/2012   Marijuana use 12/28/2012    Past Surgical History:  Procedure Laterality Date   HERNIA REPAIR     INSERTION OF IMPLANON ROD  12/08/2021    OB History     Gravida  4   Para  3   Term  3   Preterm  0   AB  1   Living  3      SAB  0   IAB  1   Ectopic  0   Multiple  0   Live Births  3            Home Medications    Prior to Admission medications   Medication Sig Start Date End Date Taking? Authorizing Provider  albuterol (VENTOLIN HFA) 108 (90 Base) MCG/ACT inhaler Inhale 2 puffs into the lungs every 6 (six) hours as needed for wheezing or shortness of breath.    [provider]  etonogestrel (NEXPLANON) 68 MG IMPL implant 1 each by Subdermal route once. 12/08/21   [provider]  ferrous sulfate 325 (65 FE) MG tablet Take 1 tablet (325 mg total) by mouth every other day. 11/11/21 01/10/22  Warner Mccreedy, MD  fluticasone-salmeterol (ADVAIR DISKUS) 250-50  MCG/ACT AEPB Inhale 1 puff into the lungs in the morning and at bedtime. 09/05/21   Katrinka Blazing, [place], CNM  loratadine (CLARITIN) 10 MG tablet Take 1 tablet (10 mg total) by mouth daily. Patient taking differently: Take 10 mg by mouth daily as needed for allergies. 09/06/21   Katrinka Blazing, [place], CNM  metroNIDAZOLE (FLAGYL) 500 MG tablet Take 1 tablet (500 mg total) by mouth 2 (two) times daily. 11/02/22   Merrilee Jansky, MD  fluticasone (FLONASE) 50 MCG/ACT nasal spray Place 1 spray into both nostrils daily. 12/07/19 05/22/20  Georgetta Haber, NP    Family History Family History  Problem Relation Age of Onset   Hypertension Mother    Hypertension Father    Hypertension Maternal Grandmother    Hypertension Maternal Grandfather    Hypertension Paternal Grandmother    Hypertension Other    Diabetes Other     Social History Social History   Tobacco Use   Smoking status: Former    Current packs/day: 0.00    Types: Cigarettes    Quit date: 03/2021    Years since quitting: 1.8   Smokeless tobacco: Never  Tobacco comments:    2 cigs/day  Vaping Use   Vaping status: Never Used  Substance Use Topics   Alcohol use: No    Comment: socially, not since confirmed pregnancy   Drug use: No     Allergies   Patient has no known allergies.   Review of Systems Review of Systems Per HPI  Physical Exam Triage Vital Signs ED Triage Vitals  Encounter Vitals Group     BP 01/03/23 0831 (!) 151/82     Systolic BP Percentile --      Diastolic BP Percentile --      Pulse Rate 01/03/23 0831 69     Resp 01/03/23 0831 20     Temp 01/03/23 0831 97.9 F (36.6 C)     Temp Source 01/03/23 0831 Oral     SpO2 01/03/23 0831 96 %     Weight --      Height --      Head Circumference --      Peak Flow --      Pain Score 01/03/23 0840 6     Pain Loc --      Pain Education --      Exclude from Growth Chart --    No data found.  Updated Vital Signs BP (!) 151/82 (BP Location: Left Arm)   Pulse 69    Temp 97.9 F (36.6 C) (Oral)   Resp 20   LMP 11/04/2022 (Approximate)   SpO2 96%   Visual Acuity Right Eye Distance:   Left Eye Distance:   Bilateral Distance:    Right Eye Near:   Left Eye Near:    Bilateral Near:     Physical Exam Constitutional:      General: She is not in acute distress.    Appearance: Normal appearance. She is not toxic-appearing or diaphoretic.  HENT:     Head: Normocephalic and atraumatic.  Eyes:     Extraocular Movements: Extraocular movements intact.     Conjunctiva/sclera: Conjunctivae normal.  Pulmonary:     Effort: Pulmonary effort is normal.  Musculoskeletal:     Comments: Has tenderness to palpation with moderate swelling present to the lateral malleolus of left ankle.  No tenderness to foot.  Limited range of motion of ankle given pain.  Capillary refill and pulses intact.  Patient can wiggle toes.  Neurological:     General: No focal deficit present.     Mental Status: She is alert and oriented to person, place, and time. Mental status is at baseline.  Psychiatric:        Mood and Affect: Mood normal.        Behavior: Behavior normal.        Thought Content: Thought content normal.        Judgment: Judgment normal.      UC Treatments / Results  Labs (all labs ordered are listed, but only abnormal results are displayed) Labs Reviewed - No data to display  EKG   Radiology DG Ankle Complete Left  Result Date: 01/03/2023 CLINICAL DATA:  Left ankle pain after twisting injury.  Swelling. EXAM: LEFT ANKLE COMPLETE - 3+ VIEW COMPARISON:  None Available. FINDINGS: No fracture or dislocation. The ankle mortise is preserved. Intact talar dome and base of the fifth metatarsal. Minimal ankle joint effusion. Soft tissue edema most prominent laterally. IMPRESSION: Soft tissue edema and joint effusion. No fracture or dislocation. Electronically Signed   By: Narda Rutherford M.D.   On: 01/03/2023 09:29  Procedures Procedures (including critical  care time)  Medications Ordered in UC Medications  ketorolac (TORADOL) 30 MG/ML injection 30 mg (30 mg Intramuscular Given 01/03/23 0946)    Initial Impression / Assessment and Plan / UC Course  I have reviewed the triage vital signs and the nursing notes.  Pertinent labs & imaging results that were available during my care of the patient were reviewed by me and considered in my medical decision making (see chart for details).     X-ray does show joint effusion of the ankle but no acute fracture.  Suspect ankle sprain as well.  Will place lace up ankle wrap and patient advised of RICE.  Crutches also supplied for weightbearing as tolerated.  Patient advised to follow-up with orthopedist if pain persists or worsens.  IM Toradol administered in urgent care today to help alleviate pain.  Patient advised no NSAIDs for least 24 hours following injection.  Advised strict return precautions.  Patient verbalized understanding and was agreeable with plan. Final Clinical Impressions(s) / UC Diagnoses   Final diagnoses:  Acute left ankle pain     Discharge Instructions      Ankle x-ray was negative for any fracture.  Suspect ankle sprain.  Ankle brace applied in urgent care.  Do not sleep in this.  Elevate and apply ice.  Follow-up with orthopedist if symptoms persist or worsen.  You were given a shot today in urgent care.  Do not take any ibuprofen, Advil, Aleve for least 24 hours following injection.     ED Prescriptions   None    PDMP not reviewed this encounter.   Gustavus Bryant, [place] 01/03/23 [phone]

## 2023-01-03 NOTE — Discharge Instructions (Addendum)
Ankle x-ray was negative for any fracture.  Suspect ankle sprain.  Ankle brace applied in urgent care.  Do not sleep in this.  Elevate and apply ice.  Follow-up with orthopedist if symptoms persist or worsen.  You were given a shot today in urgent care.  Do not take any ibuprofen, Advil, Aleve for least 24 hours following injection.

## 2023-02-24 ENCOUNTER — Ambulatory Visit: Payer: MEDICAID | Attending: Nurse Practitioner | Admitting: Nurse Practitioner

## 2023-02-24 ENCOUNTER — Other Ambulatory Visit (HOSPITAL_COMMUNITY)
Admission: RE | Admit: 2023-02-24 | Discharge: 2023-02-24 | Disposition: A | Discharge status: Home / Self Care | Payer: MEDICAID | Source: Ambulatory Visit | Attending: Nurse Practitioner | Admitting: Nurse Practitioner

## 2023-02-24 ENCOUNTER — Encounter: Payer: Self-pay | Admitting: Nurse Practitioner

## 2023-02-24 VITALS — BP 131/80 | HR 78 | Ht 64.0 in | Wt 261.6 lb

## 2023-02-24 DIAGNOSIS — B9689 Other specified bacterial agents as the cause of diseases classified elsewhere: Secondary | ICD-10-CM | POA: Insufficient documentation

## 2023-02-24 DIAGNOSIS — N644 Mastodynia: Secondary | ICD-10-CM | POA: Diagnosis not present

## 2023-02-24 DIAGNOSIS — N760 Acute vaginitis: Secondary | ICD-10-CM | POA: Diagnosis present

## 2023-02-24 DIAGNOSIS — J4520 Mild intermittent asthma, uncomplicated: Secondary | ICD-10-CM | POA: Diagnosis not present

## 2023-02-24 DIAGNOSIS — D649 Anemia, unspecified: Secondary | ICD-10-CM | POA: Diagnosis not present

## 2023-02-24 DIAGNOSIS — Z7689 Persons encountering health services in other specified circumstances: Secondary | ICD-10-CM

## 2023-02-24 MED ORDER — FERROUS SULFATE 325 (65 FE) MG PO TABS: 325.0000 mg | ORAL_TABLET | ORAL | 1 refills | Status: DC

## 2023-02-24 MED ORDER — ALBUTEROL SULFATE HFA 108 (90 BASE) MCG/ACT IN AERS: 2.0000 | INHALATION_SPRAY | Freq: Four times a day (QID) | RESPIRATORY_TRACT | 1 refills | Status: AC | PRN

## 2023-02-24 NOTE — Progress Notes (Signed)
Assessment & Plan:  Annajo was seen today for new patient (initial visit).  Diagnoses and all orders for this visit:  Encounter to establish care  Anemia, unspecified type -     ferrous sulfate 325 (65 FE) MG tablet; Take 1 tablet (325 mg total) by mouth every other day. -     CBC with Differential -     Basic metabolic panel  Mild intermittent asthma without complication -     albuterol (VENTOLIN HFA) 108 (90 Base) MCG/ACT inhaler; Inhale 2 puffs into the lungs every 6 (six) hours as needed for wheezing or shortness of breath.  Bacterial vaginitis -     Cervicovaginal ancillary only  Breast pain, left Likely normal related states her menstrual cycle did just recently end and breast pain was occurring during that time   Patient has been counseled on age-appropriate routine health concerns for screening and prevention. These are reviewed and up-to-date. Referrals have been placed accordingly. Immunizations are up-to-date or declined.    Subjective:   Chief Complaint  Patient presents with   New Patient (Initial Visit)    Lori Lam 27 y.o. female presents to office today to establish care.  She has a past medical history of Anemia, Anxiety, Asthma, Bacterial vaginitis, Chlamydia contact, treated, GERD, H/O chlamydia infection (12/28/2012), and Mild concussion (12/21/2017).    She notes previous episodes of elevated blood pressure readings and references her most recent emergency room visit for ankle pain last month.  Other than that her blood pressure readings based on chart review have been normal.  She does not have a blood pressure cuff and does not monitor her blood pressure at home.     She endorses intermittent left breast pain in the 12-1 o'clock area. Currently does not endorse any breast pain and physical exam is negative for any breast masses today.   Blood pressure is well controlled.  BP Readings from Last 3 Encounters:  02/24/23 131/80  01/03/23 (!)  151/82  12/06/22 125/78    Has re occurring BV. Will test today. May need long term therapy.  Current symptoms include increased malodorous discharge.  She has a monogamous relationship with her spouse.  Review of Systems  Constitutional:  Negative for fever, malaise/fatigue and weight loss.  HENT: Negative.  Negative for nosebleeds.   Eyes: Negative.  Negative for blurred vision, double vision and photophobia.  Respiratory: Negative.  Negative for cough and shortness of breath.   Cardiovascular: Negative.  Negative for chest pain, palpitations and leg swelling.  Gastrointestinal: Negative.  Negative for heartburn, nausea and vomiting.  Musculoskeletal: Negative.  Negative for myalgias.  Neurological: Negative.  Negative for dizziness, focal weakness, seizures and headaches.  Psychiatric/Behavioral: Negative.  Negative for suicidal ideas.     Past Medical History:  Diagnosis Date   Anemia    Anxiety    Asthma    Bacterial vaginitis    Chlamydia contact, treated    GERD (gastroesophageal reflux disease)    H/O chlamydia infection 12/28/2012   Treated again 08/2015 Negative 08/30/16   Mild concussion 12/21/2017    Past Surgical History:  Procedure Laterality Date   HERNIA REPAIR     INSERTION OF IMPLANON ROD  12/08/2021    Family History  Problem Relation Age of Onset   Hypertension Mother    Hypertension Father    Hypertension Maternal Grandmother    Hypertension Maternal Grandfather    Hypertension Paternal Grandmother    Hypertension Other    Diabetes  Other     Social History Reviewed with no changes to be made today.   Outpatient Medications Prior to Visit  Medication Sig Dispense Refill   etonogestrel (NEXPLANON) 68 MG IMPL implant 1 each by Subdermal route once.     fluticasone (FLONASE) 50 MCG/ACT nasal spray Place 1 spray into both nostrils daily. 16 g 0   fluticasone-salmeterol (ADVAIR DISKUS) 250-50 MCG/ACT AEPB Inhale 1 puff into the lungs in the morning and at  bedtime. 1 each 6   loratadine (CLARITIN) 10 MG tablet Take 1 tablet (10 mg total) by mouth daily. 30 tablet 6   metroNIDAZOLE (FLAGYL) 500 MG tablet Take 1 tablet (500 mg total) by mouth 2 (two) times daily. 14 tablet 0   albuterol (VENTOLIN HFA) 108 (90 Base) MCG/ACT inhaler Inhale 2 puffs into the lungs every 6 (six) hours as needed for wheezing or shortness of breath.     ferrous sulfate 325 (65 FE) MG tablet Take 1 tablet (325 mg total) by mouth every other day. 30 tablet 0   No facility-administered medications prior to visit.    No Known Allergies     Objective:    BP 131/80 (BP Location: Left Arm, Patient Position: Sitting, Cuff Size: Large)   Pulse 78   Ht 5\' 4"  (1.626 m)   Wt 261 lb 9.6 oz (118.7 kg)   LMP 02/17/2023   SpO2 100%   Breastfeeding No   BMI 44.90 kg/m  Wt Readings from Last 3 Encounters:  02/24/23 261 lb 9.6 oz (118.7 kg)  12/06/22 254 lb (115.2 kg)  12/08/21 222 lb (100.7 kg)    Physical Exam Vitals and nursing note reviewed.  Constitutional:      Appearance: She is well-developed.  HENT:     Head: Normocephalic and atraumatic.  Cardiovascular:     Rate and Rhythm: Normal rate and regular rhythm.     Heart sounds: Normal heart sounds. No murmur heard.    No friction rub. No gallop.  Pulmonary:     Effort: Pulmonary effort is normal. No tachypnea or respiratory distress.     Breath sounds: Normal breath sounds. No decreased breath sounds, wheezing, rhonchi or rales.  Chest:     Chest wall: No mass or tenderness.  Breasts:    Right: Normal.     Left: Normal.  Abdominal:     General: Bowel sounds are normal.     Palpations: Abdomen is soft.  Musculoskeletal:        General: Normal range of motion.     Cervical back: Normal range of motion.  Lymphadenopathy:     Upper Body:     Right upper body: No supraclavicular, axillary or pectoral adenopathy.     Left upper body: No supraclavicular, axillary or pectoral adenopathy.  Skin:    General:  Skin is warm and dry.  Neurological:     Mental Status: She is alert and oriented to person, place, and time.     Coordination: Coordination normal.  Psychiatric:        Behavior: Behavior normal. Behavior is cooperative.        Thought Content: Thought content normal.        Judgment: Judgment normal.          Patient has been counseled extensively about nutrition and exercise as well as the importance of adherence with medications and regular follow-up. The patient was given clear instructions to go to ER or return to medical center if symptoms don't improve,  worsen or new problems develop. The patient verbalized understanding.   Follow-up: Return in about 3 months (around 05/27/2023) for physical.   Claiborne Rigg, FNP-BC [facility] [place], [place] [phone]   02/24/2023, 1:42 PM

## 2023-02-24 NOTE — Progress Notes (Signed)
Burning sensation on top of left breast.

## 2023-02-25 LAB — BASIC METABOLIC PANEL
BUN/Creatinine Ratio: 16 (ref 9–23)
BUN: 12 mg/dL (ref 6–20)
CO2: 23 mmol/L (ref 20–29)
Calcium: 8.9 mg/dL (ref 8.7–10.2)
Chloride: 104 mmol/L (ref 96–106)
Creatinine, Ser: 0.73 mg/dL (ref 0.57–1.00)
Glucose: 80 mg/dL (ref 70–99)
Potassium: 3.8 mmol/L (ref 3.5–5.2)
Sodium: 140 mmol/L (ref 134–144)
eGFR: 114 mL/min/{1.73_m2} (ref >59)

## 2023-02-25 LAB — CBC WITH DIFFERENTIAL/PLATELET
Basophils Absolute: 0 10*3/uL (ref 0.0–0.2)
Basos: 1 %
EOS (ABSOLUTE): 0.2 10*3/uL (ref 0.0–0.4)
Eos: 2 %
Hematocrit: 36.2 % (ref 34.0–46.6)
Hemoglobin: 11.7 g/dL (ref 11.1–15.9)
Immature Grans (Abs): 0 10*3/uL (ref 0.0–0.1)
Immature Granulocytes: 0 %
Lymphocytes Absolute: 2.1 10*3/uL (ref 0.7–3.1)
Lymphs: 29 %
MCH: 28.3 pg (ref 26.6–33.0)
MCHC: 32.3 g/dL (ref 31.5–35.7)
MCV: 88 fL (ref 79–97)
Monocytes Absolute: 0.7 10*3/uL (ref 0.1–0.9)
Monocytes: 9 %
Neutrophils Absolute: 4.1 10*3/uL (ref 1.4–7.0)
Neutrophils: 59 %
Platelets: 297 10*3/uL (ref 150–450)
RBC: 4.13 x10E6/uL (ref 3.77–5.28)
RDW: 12.5 % (ref 11.7–15.4)
WBC: 7.1 10*3/uL (ref 3.4–10.8)

## 2023-02-26 ENCOUNTER — Other Ambulatory Visit: Payer: Self-pay | Admitting: Nurse Practitioner

## 2023-02-26 MED ORDER — METRONIDAZOLE 500 MG PO TABS: 500.0000 mg | ORAL_TABLET | Freq: Two times a day (BID) | ORAL | 0 refills | Status: DC

## 2023-02-27 LAB — CERVICOVAGINAL ANCILLARY ONLY
Bacterial Vaginitis (gardnerella): POSITIVE — AB
Candida Glabrata: NEGATIVE
Candida Vaginitis: NEGATIVE
Chlamydia: NEGATIVE
Comment: NEGATIVE
Comment: NEGATIVE
Comment: NEGATIVE
Comment: NEGATIVE
Comment: NEGATIVE
Comment: NORMAL
Neisseria Gonorrhea: NEGATIVE
Trichomonas: NEGATIVE

## 2023-05-03 NOTE — L&D Delivery Note (Signed)
    Lori Lam is a 28 y.o. [id number] s/p VD at [redacted]w[redacted]d. She was admitted for IOL s/t BMI.   ROM: 7h 36m with clear fluid GBS Status: Negative/-- (09/03 1721)   Labor Progress: Patient arrived and induction started with AROM and initiation of pitocin . She received epidural and was noted to be 4cm after placement.  She progressed to complete and delivered as a below.   Delivery Date/Time: Thursday Sept 25, 2025 at 1713 Delivery: Called to room and patient was complete and encouraged to push. Head delivered OA position. After delivery of head and confirmation of no nuchal cord, SO-Elijah, encouraged to assist with delivery of shoulder and body which were delivered easily.  Infant with good tone and spontaneous cry and held by SO while provider gave tactile stimulation. Infant  placed on mother's abdomen where nurse continued tactile stimulation. After 2 minute delay, the umbilical cord was clamped, cut by-Elijah and blood collected by provider. Pitocin  started and placenta delivered spontaneously via Keren and was noted to be intact with 3VC upon inspection.  Cord sample obtained. Vaginal inspection revealed no lacerations.  Fundus firm, at the umbilicus, and bleeding small.  Reviewed immediate postpartum care including fundal checks, pain medication, and transfer to [facility]. Mother hemodynamically stable and infant skin to skin prior to provider exit.    Placenta: Disposal Complications:  -Labor/Delivery: None -Prenatal: Obesity, H/O IUGR, Asthma Lacerations: None EBL: Analgesia: Epidural  Postpartum Planning -Feeding: Breast/Bottle -Circumcision: N/A -Anticipated BCM: Unsure -PP Message Sent -Discharge Summary Shared  Infant: Marlon Sara  APGARs 8, 9  3600g, 7lbs 15oz, 18.5in  Harlene LITTIE Duncans, CNM  01/25/2024 5:51 PM  Delivery Report: Review the Delivery Report for details.

## 2023-05-26 ENCOUNTER — Ambulatory Visit: Payer: MEDICAID

## 2023-05-26 DIAGNOSIS — Z3201 Encounter for pregnancy test, result positive: Secondary | ICD-10-CM | POA: Diagnosis not present

## 2023-05-26 DIAGNOSIS — Z3200 Encounter for pregnancy test, result unknown: Secondary | ICD-10-CM

## 2023-05-26 DIAGNOSIS — N912 Amenorrhea, unspecified: Secondary | ICD-10-CM

## 2023-05-26 LAB — POCT PREGNANCY, URINE: Preg Test, Ur: POSITIVE — AB

## 2023-05-26 NOTE — Progress Notes (Signed)
Possible Pregnancy  Patient dropped off urine today for pregnancy confirmation. UPT in office today is positive. Pt reports first positive home UPT on 05/21/23. Reviewed dating with patient:   LMP: 04/24/23 EDD: 01/29/24 4w 4d today  OB history reviewed. Reviewed medications and allergies with patient. Recommended pt begin prenatal vitamin and schedule prenatal care. Patient reports taking prenatal vitamins and would like to start prenatal care at our office. Patient denies any vaginal bleeding and/or pain. Advised patient if she starts having any vaginal bleeding and/or pain to go to the MAU. Patient verbalized understanding. Dating and viability [place] offered. Patient aware that she will not get any pictures at the appointment. [place] scheduled for 2/17 at 8:30 AM at [facility]. Patient states she will view appointment via Mychart. Informed patient that our front office will reach out to schedule patient for new OB intake and first prenatal visit. Patient verbalized understanding and denies any other needs at this time.     Quintella Reichert, RN 05/26/2023  11:11 AM

## 2023-06-02 ENCOUNTER — Ambulatory Visit: Payer: MEDICAID | Attending: Nurse Practitioner | Admitting: Nurse Practitioner

## 2023-06-02 ENCOUNTER — Encounter: Payer: Self-pay | Admitting: Nurse Practitioner

## 2023-06-02 VITALS — BP 121/80 | HR 69 | Resp 20 | Ht 64.0 in | Wt 274.0 lb

## 2023-06-02 DIAGNOSIS — L308 Other specified dermatitis: Secondary | ICD-10-CM | POA: Diagnosis not present

## 2023-06-02 DIAGNOSIS — Z0000 Encounter for general adult medical examination without abnormal findings: Secondary | ICD-10-CM

## 2023-06-02 MED ORDER — TRIAMCINOLONE ACETONIDE 0.025 % EX OINT: 1.0000 | TOPICAL_OINTMENT | Freq: Two times a day (BID) | CUTANEOUS | 0 refills | Status: DC

## 2023-06-02 NOTE — Progress Notes (Signed)
Assessment & Plan:  [name] was seen today for medical management of chronic issues.  Diagnoses and all orders for this visit:  Encounter for annual physical exam  Other eczema -     triamcinolone (KENALOG) 0.025 % ointment; Apply 1 Application topically 2 (two) times daily. Apply to areas of rough skin sparingly. Do not use more than 2 times per week    Patient has been counseled on age-appropriate routine health concerns for screening and prevention. These are reviewed and up-to-date. Referrals have been placed accordingly. Immunizations are up-to-date or declined.    Subjective:   Chief Complaint  Patient presents with   Medical Management of Chronic Issues    Lori Lam 28 y.o. female presents to office today for annual physical exam. She is currently [redacted] weeks pregnant  Concerned about rough patchy areas and small papillae on stomach and around breasts.    Review of Systems  Constitutional:  Negative for fever, malaise/fatigue and weight loss.  HENT: Negative.  Negative for nosebleeds.   Eyes: Negative.  Negative for blurred vision, double vision and photophobia.  Respiratory: Negative.  Negative for cough and shortness of breath.   Cardiovascular: Negative.  Negative for chest pain, palpitations and leg swelling.  Gastrointestinal: Negative.  Negative for heartburn, nausea and vomiting.  Genitourinary: Negative.   Musculoskeletal: Negative.  Negative for myalgias.  Skin: Negative.        SEE HPI  Neurological: Negative.  Negative for dizziness, focal weakness, seizures and headaches.  Endo/Heme/Allergies: Negative.   Psychiatric/Behavioral: Negative.  Negative for suicidal ideas.     Past Medical History:  Diagnosis Date   Anemia    Anxiety    Asthma    Bacterial vaginitis    Chlamydia contact, treated    GERD (gastroesophageal reflux disease)    H/O chlamydia infection 12/28/2012   Treated again 08/2015 Negative 08/30/16   Mild concussion 12/21/2017     Past Surgical History:  Procedure Laterality Date   HERNIA REPAIR     INSERTION OF IMPLANON ROD  12/08/2021    Family History  Problem Relation Age of Onset   Hypertension Mother    Hypertension Father    Hypertension Maternal Grandmother    Hypertension Maternal Grandfather    Hypertension Paternal Grandmother    Hypertension Other    Diabetes Other     Social History Reviewed with no changes to be made today.   Outpatient Medications Prior to Visit  Medication Sig Dispense Refill   albuterol (VENTOLIN HFA) 108 (90 Base) MCG/ACT inhaler Inhale 2 puffs into the lungs every 6 (six) hours as needed for wheezing or shortness of breath. 18 g 1   ferrous sulfate 325 (65 FE) MG tablet Take 1 tablet (325 mg total) by mouth every other day. (Patient not taking: Reported on 05/26/2023) 90 tablet 1   fluticasone (FLONASE) 50 MCG/ACT nasal spray Place 1 spray into both nostrils daily. 16 g 0   fluticasone-salmeterol (ADVAIR DISKUS) 250-50 MCG/ACT AEPB Inhale 1 puff into the lungs in the morning and at bedtime. (Patient not taking: Reported on 05/26/2023) 1 each 6   loratadine (CLARITIN) 10 MG tablet Take 1 tablet (10 mg total) by mouth daily. (Patient not taking: Reported on 05/26/2023) 30 tablet 6   Prenatal Vit-Fe Fumarate-FA (MULTIVITAMIN-PRENATAL) 27-0.8 MG TABS tablet Take 1 tablet by mouth daily at 12 noon.     No facility-administered medications prior to visit.    No Known Allergies     Objective:  BP 121/80 (BP Location: Left Arm, Patient Position: Sitting, Cuff Size: Large)   Pulse 69   Resp 20   Ht 5\' 4"  (1.626 m)   Wt 274 lb (124.3 kg)   LMP 04/24/2023 (Exact Date)   SpO2 100%   BMI 47.03 kg/m  Wt Readings from Last 3 Encounters:  06/02/23 274 lb (124.3 kg)  02/24/23 261 lb 9.6 oz (118.7 kg)  12/06/22 254 lb (115.2 kg)    Physical Exam Constitutional:      Appearance: She is well-developed.  HENT:     Head: Normocephalic and atraumatic.     Right Ear:  Hearing, tympanic membrane, ear canal and external ear normal.     Left Ear: Hearing, tympanic membrane, ear canal and external ear normal.     Nose: Nose normal.     Right Turbinates: Not enlarged.     Left Turbinates: Not enlarged.     Mouth/Throat:     Lips: Pink.     Mouth: Mucous membranes are moist.     Dentition: No dental tenderness, gingival swelling, dental abscesses or gum lesions.     Pharynx: No oropharyngeal exudate.  Eyes:     General: No scleral icterus.       Right eye: No discharge.     Extraocular Movements: Extraocular movements intact.     Conjunctiva/sclera: Conjunctivae normal.     Pupils: Pupils are equal, round, and reactive to light.  Neck:     Thyroid: No thyromegaly.     Trachea: No tracheal deviation.  Cardiovascular:     Rate and Rhythm: Normal rate and regular rhythm.     Heart sounds: Normal heart sounds. No murmur heard.    No friction rub.  Pulmonary:     Effort: Pulmonary effort is normal. No accessory muscle usage or respiratory distress.     Breath sounds: Normal breath sounds. No decreased breath sounds, wheezing, rhonchi or rales.  Abdominal:     General: Bowel sounds are normal. There is no distension.     Palpations: Abdomen is soft. There is no mass.     Tenderness: There is no abdominal tenderness. There is no right CVA tenderness, left CVA tenderness, guarding or rebound.     Hernia: No hernia is present.  Musculoskeletal:        General: No tenderness or deformity. Normal range of motion.     Cervical back: Normal range of motion and neck supple.  Lymphadenopathy:     Cervical: No cervical adenopathy.  Skin:    General: Skin is warm and dry.     Findings: No erythema.  Neurological:     Mental Status: She is alert and oriented to person, place, and time.     Cranial Nerves: No cranial nerve deficit.     Motor: Motor function is intact.     Coordination: Coordination is intact. Coordination normal.     Gait: Gait is intact.      Deep Tendon Reflexes:     Reflex Scores:      Patellar reflexes are 1+ on the right side and 1+ on the left side. Psychiatric:        Attention and Perception: Attention normal.        Mood and Affect: Mood normal.        Speech: Speech normal.        Behavior: Behavior normal.        Thought Content: Thought content normal.  Judgment: Judgment normal.          Patient has been counseled extensively about nutrition and exercise as well as the importance of adherence with medications and regular follow-up. The patient was given clear instructions to go to ER or return to medical center if symptoms don't improve, worsen or new problems develop. The patient verbalized understanding.   Follow-up: Return if symptoms worsen or fail to improve.   Claiborne Rigg, FNP-BC [facility] [place], [place] [phone]   06/02/2023, 12:12 PM

## 2023-06-19 ENCOUNTER — Ambulatory Visit (HOSPITAL_COMMUNITY)
Admission: RE | Admit: 2023-06-19 | Discharge: 2023-06-19 | Disposition: A | Discharge status: Home / Self Care | Payer: MEDICAID | Source: Ambulatory Visit | Attending: Obstetrics and Gynecology | Admitting: Obstetrics and Gynecology

## 2023-06-19 ENCOUNTER — Encounter (HOSPITAL_COMMUNITY): Payer: Self-pay

## 2023-06-19 DIAGNOSIS — Z3200 Encounter for pregnancy test, result unknown: Secondary | ICD-10-CM

## 2023-06-23 ENCOUNTER — Encounter (HOSPITAL_COMMUNITY): Payer: Self-pay | Admitting: *Deleted

## 2023-06-23 ENCOUNTER — Inpatient Hospital Stay (HOSPITAL_COMMUNITY)
Admission: AD | Admit: 2023-06-23 | Discharge: 2023-06-23 | Disposition: A | Discharge status: Home / Self Care | Payer: MEDICAID | Attending: Obstetrics & Gynecology | Admitting: Obstetrics & Gynecology

## 2023-06-23 ENCOUNTER — Inpatient Hospital Stay (HOSPITAL_COMMUNITY): Payer: MEDICAID

## 2023-06-23 DIAGNOSIS — O209 Hemorrhage in early pregnancy, unspecified: Secondary | ICD-10-CM | POA: Diagnosis present

## 2023-06-23 DIAGNOSIS — Z3A08 8 weeks gestation of pregnancy: Secondary | ICD-10-CM | POA: Insufficient documentation

## 2023-06-23 DIAGNOSIS — O219 Vomiting of pregnancy, unspecified: Secondary | ICD-10-CM | POA: Insufficient documentation

## 2023-06-23 DIAGNOSIS — R109 Unspecified abdominal pain: Secondary | ICD-10-CM | POA: Diagnosis present

## 2023-06-23 DIAGNOSIS — R42 Dizziness and giddiness: Secondary | ICD-10-CM | POA: Diagnosis present

## 2023-06-23 HISTORY — DX: Headache, unspecified: R51.9

## 2023-06-23 LAB — CBC
HCT: 33.7 % — ABNORMAL LOW (ref 36.0–46.0)
Hemoglobin: 11 g/dL — ABNORMAL LOW (ref 12.0–15.0)
MCH: 28.3 pg (ref 26.0–34.0)
MCHC: 32.6 g/dL (ref 30.0–36.0)
MCV: 86.6 fL (ref 80.0–100.0)
Platelets: 293 10*3/uL (ref 150–400)
RBC: 3.89 MIL/uL (ref 3.87–5.11)
RDW: 13.2 % (ref 11.5–15.5)
WBC: 9 10*3/uL (ref 4.0–10.5)
nRBC: 0 % (ref 0.0–0.2)

## 2023-06-23 LAB — COMPREHENSIVE METABOLIC PANEL
ALT: 22 U/L (ref 0–44)
AST: 17 U/L (ref 15–41)
Albumin: 3.5 g/dL (ref 3.5–5.0)
Alkaline Phosphatase: 56 U/L (ref 38–126)
Anion gap: 7 (ref 5–15)
BUN: 5 mg/dL — ABNORMAL LOW (ref 6–20)
CO2: 25 mmol/L (ref 22–32)
Calcium: 9.2 mg/dL (ref 8.9–10.3)
Chloride: 103 mmol/L (ref 98–111)
Creatinine, Ser: 0.88 mg/dL (ref 0.44–1.00)
GFR, Estimated: >60 mL/min (ref >60)
Glucose, Bld: 90 mg/dL (ref 70–99)
Potassium: 3.8 mmol/L (ref 3.5–5.1)
Sodium: 135 mmol/L (ref 135–145)
Total Bilirubin: 0.4 mg/dL (ref 0.0–1.2)
Total Protein: 7.2 g/dL (ref 6.5–8.1)

## 2023-06-23 LAB — URINALYSIS, ROUTINE W REFLEX MICROSCOPIC
Bilirubin Urine: NEGATIVE
Glucose, UA: NEGATIVE mg/dL
Hgb urine dipstick: NEGATIVE
Ketones, ur: NEGATIVE mg/dL
Leukocytes,Ua: NEGATIVE
Nitrite: NEGATIVE
Protein, ur: NEGATIVE mg/dL
Specific Gravity, Urine: 1.026 (ref 1.005–1.030)
pH: 6 (ref 5.0–8.0)

## 2023-06-23 LAB — ABO/RH: ABO/RH(D): B POS

## 2023-06-23 LAB — HCG, QUANTITATIVE, PREGNANCY: hCG, Beta Chain, Quant, S: 73386 m[IU]/mL — ABNORMAL HIGH (ref <5)

## 2023-06-23 MED ORDER — ONDANSETRON 4 MG PO TBDP: 4.0000 mg | ORAL_TABLET | Freq: Four times a day (QID) | ORAL | 0 refills | Status: DC | PRN

## 2023-06-23 NOTE — Discharge Instructions (Signed)

## 2023-06-23 NOTE — MAU Note (Signed)
Lori Lam is a 28 y.o. at [redacted]w[redacted]d here in MAU reporting: been having abd pain at the top, and cramping at the bottom. Can't keep water down, so is always dizzy. (She is able to drink and keep down other things, intake discussed). Has not vomited today, hasn't really drank anything, had a few bites of pizza. Hasn't been seen yet, only had preg confirmation.  LMP: 12/23 Onset of complaint: spotting 2 days ago- dark red (after intercourse)- none today, pain upper abd for 2 wks Pain score: upper 6. cramping lower 7 Vitals:   06/23/23 1231  BP: 129/71  Pulse: (!) 59  Resp: 18  Temp: 98.4 F (36.9 C)  SpO2: 100%      Lab orders placed from triage:  urine

## 2023-06-23 NOTE — MAU Provider Note (Signed)
Chief Complaint: Abdominal Pain and Emesis  SUBJECTIVE HPI: Lori Lam is a 28 y.o. [id number] at [redacted]w[redacted]d by LMP who presents to maternity admissions reporting 6-7/10 abdominal pain, dark red spotting over the weekend but not today and nausea. Positive home pregnancy test.  She denies vaginal bleeding, vaginal itching/burning, urinary symptoms, h/a, dizziness, n/v, or fever/chills.     HPI  Past Medical History:  Diagnosis Date   Anemia    Anxiety    Asthma    Bacterial vaginitis    Chlamydia contact, treated    GERD (gastroesophageal reflux disease)    H/O chlamydia infection 12/28/2012   Treated again 08/2015 Negative 08/30/16   Headache    Mild concussion 12/21/2017   Past Surgical History:  Procedure Laterality Date   HERNIA REPAIR     INSERTION OF IMPLANON ROD  12/08/2021   Social History   Socioeconomic History   Marital status: Married    Spouse name: Not on file   Number of children: Not on file   Years of education: Not on file   Highest education level: 12th grade  Occupational History   Not on file  Tobacco Use   Smoking status: Former    Current packs/day: 0.00    Types: Cigarettes    Quit date: 03/2021    Years since quitting: 2.3   Smokeless tobacco: Never   Tobacco comments:    2 cigs/day  Vaping Use   Vaping status: Never Used  Substance and Sexual Activity   Alcohol use: Not Currently    Comment: socially, not since confirmed pregnancy   Drug use: No   Sexual activity: Yes    Partners: Male    Birth control/protection: None  Other Topics Concern   Not on file  Social History Narrative   Not on file   Social Drivers of Health   Financial Resource Strain: Low Risk  (06/01/2023)   Overall Financial Resource Strain (CARDIA)    Difficulty of Paying Living Expenses: Not hard at all  Food Insecurity: No Food Insecurity (06/01/2023)   Hunger Vital Sign    Worried About Running Out of Food in the Last Year: Never true    Ran Out of Food in the  Last Year: Never true  Transportation Needs: No Transportation Needs (06/01/2023)   PRAPARE - Administrator, Civil Service (Medical): No    Lack of Transportation (Non-Medical): No  Physical Activity: Insufficiently Active (06/01/2023)   Exercise Vital Sign    Days of Exercise per Week: 3 days    Minutes of Exercise per Session: 30 min  Stress: No Stress Concern Present (06/01/2023)   [employer] of Occupational Health - Occupational Stress Questionnaire    Feeling of Stress : Not at all  Social Connections: Unknown (06/01/2023)   Social Connection and Isolation Panel [NHANES]    Frequency of Communication with Friends and Family: More than three times a week    Frequency of Social Gatherings with Friends and Family: Twice a week    Attends Religious Services: Patient declined    Database administrator or Organizations: No    Attends Banker Meetings: Never    Marital Status: Patient declined  Catering manager Violence: Not At Risk (02/24/2023)   Humiliation, Afraid, Rape, and Kick questionnaire    Fear of Current or Ex-Partner: No    Emotionally Abused: No    Physically Abused: No    Sexually Abused: No   No current facility-administered medications  on file prior to encounter.   Current Outpatient Medications on File Prior to Encounter  Medication Sig Dispense Refill   albuterol (VENTOLIN HFA) 108 (90 Base) MCG/ACT inhaler Inhale 2 puffs into the lungs every 6 (six) hours as needed for wheezing or shortness of breath. 18 g 1   Prenatal Vit-Fe Fumarate-FA (MULTIVITAMIN-PRENATAL) 27-0.8 MG TABS tablet Take 1 tablet by mouth daily at 12 noon.     triamcinolone (KENALOG) 0.025 % ointment Apply 1 Application topically 2 (two) times daily. Apply to areas of rough skin sparingly. Do not use more than 2 times per week 30 g 0   ferrous sulfate 325 (65 FE) MG tablet Take 1 tablet (325 mg total) by mouth every other day. (Patient not taking: Reported on 05/26/2023)  90 tablet 1   fluticasone (FLONASE) 50 MCG/ACT nasal spray Place 1 spray into both nostrils daily. 16 g 0   fluticasone-salmeterol (ADVAIR DISKUS) 250-50 MCG/ACT AEPB Inhale 1 puff into the lungs in the morning and at bedtime. (Patient not taking: Reported on 05/26/2023) 1 each 6   loratadine (CLARITIN) 10 MG tablet Take 1 tablet (10 mg total) by mouth daily. (Patient not taking: Reported on 05/26/2023) 30 tablet 6   No Known Allergies  I have reviewed patient's Past Medical Hx, Surgical Hx, Family Hx, Social Hx, medications and allergies.   ROS:  Review of Systems Review of Systems  Other systems negative   Physical Exam  Physical Exam Patient Vitals for the past 24 hrs:  BP Temp Temp src Pulse Resp SpO2 Height Weight  06/23/23 1231 129/71 98.4 F (36.9 C) Oral (!) 59 18 100 % 5\' 4"  (1.626 m) 121.6 kg   Constitutional: Well-developed, well-nourished female in no acute distress.  Cardiovascular: normal rate Respiratory: normal effort GI: Abd soft, non-tender. Pos BS x 4 MS: Extremities nontender, no edema, normal ROM Neurologic: Alert and oriented x 4.  GU: Neg CVAT.   LAB RESULTS Results for orders placed or performed during the hospital encounter of 06/23/23 (from the past 24 hours)  ABO/Rh     Status: None   Collection Time: 06/23/23 11:40 AM  Result Value Ref Range   ABO/RH(D)      B POS Performed at [facility], [address], [place], [place] [address]   CBC     Status: Abnormal   Collection Time: 06/23/23 11:42 AM  Result Value Ref Range   WBC 9.0 4.0 - 10.5 K/uL   RBC 3.89 3.87 - 5.11 MIL/uL   Hemoglobin 11.0 (L) 12.0 - 15.0 g/dL   HCT 60.4 (L) 54.0 - 98.1 %   MCV 86.6 80.0 - 100.0 fL   MCH 28.3 26.0 - 34.0 pg   MCHC 32.6 30.0 - 36.0 g/dL   RDW 19.1 47.8 - 29.5 %   Platelets 293 150 - 400 K/uL   nRBC 0.0 0.0 - 0.2 %  Comprehensive metabolic panel     Status: Abnormal   Collection Time: 06/23/23 11:42 AM  Result Value Ref Range   Sodium 135 135 -  145 mmol/L   Potassium 3.8 3.5 - 5.1 mmol/L   Chloride 103 98 - 111 mmol/L   CO2 25 22 - 32 mmol/L   Glucose, Bld 90 70 - 99 mg/dL   BUN 5 (L) 6 - 20 mg/dL   Creatinine, Ser 6.21 0.44 - 1.00 mg/dL   Calcium 9.2 8.9 - 30.8 mg/dL   Total Protein 7.2 6.5 - 8.1 g/dL   Albumin 3.5  3.5 - 5.0 g/dL   AST 17 15 - 41 U/L   ALT 22 0 - 44 U/L   Alkaline Phosphatase 56 38 - 126 U/L   Total Bilirubin 0.4 0.0 - 1.2 mg/dL   GFR, Estimated >78 >29 mL/min   Anion gap 7 5 - 15  hCG, quantitative, pregnancy     Status: Abnormal   Collection Time: 06/23/23 11:42 AM  Result Value Ref Range   hCG, Beta Chain, Quant, S 73,386 (H) <5 mIU/mL  Urinalysis, Routine w reflex microscopic -Urine, Clean Catch     Status: None   Collection Time: 06/23/23 12:51 PM  Result Value Ref Range   Color, Urine YELLOW YELLOW   APPearance CLEAR CLEAR   Specific Gravity, Urine 1.026 1.005 - 1.030   pH 6.0 5.0 - 8.0   Glucose, UA NEGATIVE NEGATIVE mg/dL   Hgb urine dipstick NEGATIVE NEGATIVE   Bilirubin Urine NEGATIVE NEGATIVE   Ketones, ur NEGATIVE NEGATIVE mg/dL   Protein, ur NEGATIVE NEGATIVE mg/dL   Nitrite NEGATIVE NEGATIVE   Leukocytes,Ua NEGATIVE NEGATIVE    --/--/B POS Performed at [facility], [address] [place], [place] [address]  (02/21 1140)  IMAGING US OB LESS THAN 14 WEEKS WITH OB TRANSVAGINAL Result Date: 06/23/2023 CLINICAL DATA:  Pelvic pain and vaginal bleeding in 1st trimester pregnancy. EXAM: OBSTETRIC <14 WK [place] AND TRANSVAGINAL OB US TECHNIQUE: Both transabdominal and transvaginal ultrasound examinations were performed for complete evaluation of the gestation as well as the maternal uterus, adnexal regions, and pelvic cul-de-sac. Transvaginal technique was performed to assess early pregnancy. COMPARISON:  None Available. FINDINGS: Intrauterine gestational sac: Single Yolk sac:  Visualized. Embryo:  Visualized. Cardiac Activity: Visualized. Heart Rate: 158 bpm CRL:  20 mm   8 w   4 d                   [place] EDC: 01/29/2024 Subchorionic hemorrhage:  None visualized. Maternal uterus/adnexae: Both ovaries are normal in appearance. No mass or abnormal free fluid identified. IMPRESSION: Single living IUP with estimated gestational age of [redacted] weeks 4 days, and [place] EDC of 01/29/2024. No maternal uterine or adnexal abnormality identified. Electronically Signed   By: Danae Orleans M.D.   On: 06/23/2023 13:46    MAU Management/MDM: I have reviewed the triage vital signs and the nursing notes.   Pertinent labs & imaging results that were available during my care of the patient were reviewed by me and considered in my medical decision making (see chart for details).      I have reviewed her medical records including past results, notes and treatments. Medical, Surgical, and family history were reviewed.  Medications and recent lab tests were reviewed  Ordered usual first trimester r/o ectopic labs.   Pelvic exam and cultures done Will check baseline Ultrasound to rule out ectopic.  Treatments in MAU included [place] work up.   This bleeding/pain can represent a normal pregnancy with bleeding, spontaneous abortion or even an ectopic which can be life-threatening.  The process as listed above helps to determine which of these is present.  ASSESSMENT 1. [redacted] weeks gestation of pregnancy   2. Nausea/vomiting in pregnancy   SIUP confirmed on [place] today   PLAN Discharge home OB/GYN provider list provided Safe medications in pregnancy list provided  Establish prenatal care with provider of choice  Allergies as of 06/23/2023   No Known Allergies      Medication List     TAKE  these medications    albuterol 108 (90 Base) MCG/ACT inhaler Commonly known as: VENTOLIN HFA Inhale 2 puffs into the lungs every 6 (six) hours as needed for wheezing or shortness of breath.   ferrous sulfate 325 (65 FE) MG tablet Take 1 tablet (325 mg total) by mouth every other day.   fluticasone 50 MCG/ACT nasal  spray Commonly known as: FLONASE Place 1 spray into both nostrils daily.   fluticasone-salmeterol 250-50 MCG/ACT Aepb Commonly known as: Advair Diskus Inhale 1 puff into the lungs in the morning and at bedtime.   loratadine 10 MG tablet Commonly known as: CLARITIN Take 1 tablet (10 mg total) by mouth daily.   multivitamin-prenatal 27-0.8 MG Tabs tablet Take 1 tablet by mouth daily at 12 noon.   ondansetron 4 MG disintegrating tablet Commonly known as: ZOFRAN-ODT Take 1 tablet (4 mg total) by mouth every 6 (six) hours as needed for nausea.   triamcinolone 0.025 % ointment Commonly known as: KENALOG Apply 1 Application topically 2 (two) times daily. Apply to areas of rough skin sparingly. Do not use more than 2 times per week        Pt stable at time of discharge. Encouraged to return here if she develops worsening of symptoms, increase in pain, fever, or other concerning symptoms.   Wyn Forster, MD FMOB Fellow, Faculty practice [facility], [facility]  06/23/2023  6:30 PM

## 2023-07-04 ENCOUNTER — Telehealth (INDEPENDENT_AMBULATORY_CARE_PROVIDER_SITE_OTHER): Payer: MEDICAID

## 2023-07-04 DIAGNOSIS — Z3A10 10 weeks gestation of pregnancy: Secondary | ICD-10-CM | POA: Diagnosis not present

## 2023-07-04 DIAGNOSIS — O0990 Supervision of high risk pregnancy, unspecified, unspecified trimester: Secondary | ICD-10-CM | POA: Insufficient documentation

## 2023-07-04 NOTE — Patient Instructions (Signed)
 Safe Medications in Pregnancy   Acne:  Benzoyl Peroxide  Salicylic Acid   Backache/Headache:  Tylenol: 2 regular strength every 4 hours OR               2 Extra strength every 6 hours   Colds/Coughs/Allergies:  Benadryl (alcohol free) 25 mg every 6 hours as needed  Breath right strips  Claritin  Cepacol throat lozenges  Chloraseptic throat spray  Cold-Eeze- up to three times per day  Cough drops, alcohol free  Flonase (by prescription only)  Guaifenesin  Mucinex  Robitussin DM (plain only, alcohol free)  Saline nasal spray/drops  Sudafed (pseudoephedrine) & Actifed * use only after [redacted] weeks gestation and if you do not have high blood pressure  Tylenol  Vicks Vaporub  Zinc lozenges  Zyrtec   Constipation:  Colace  Ducolax suppositories  Fleet enema  Glycerin suppositories  Metamucil  Milk of magnesia  Miralax  Senokot  Smooth move tea   Diarrhea:  Kaopectate  Imodium A-D   *NO pepto Bismol   Hemorrhoids:  Anusol  Anusol HC  Preparation H  Tucks   Indigestion:  Tums  Maalox  Mylanta  Zantac  Pepcid   Insomnia:  Benadryl (alcohol free) 25mg  every 6 hours as needed  Tylenol PM  Unisom, no Gelcaps   Leg Cramps:  Tums  MagGel   Nausea/Vomiting:  Bonine  Dramamine  Emetrol  Ginger extract  Sea bands  Meclizine  Nausea medication to take during pregnancy:  Unisom (doxylamine succinate 25 mg tablets) Take one tablet daily at bedtime. If symptoms are not adequately controlled, the dose can be increased to a maximum recommended dose of two tablets daily (1/2 tablet in the morning, 1/2 tablet mid-afternoon and one at bedtime).  Vitamin B6 100mg  tablets. Take one tablet twice a day (up to 200 mg per day).   Skin Rashes:  Aveeno products  Benadryl cream or 25mg  every 6 hours as needed  Calamine Lotion  1% cortisone cream   Yeast infection:  Gyne-lotrimin 7  Monistat 7    **If taking multiple medications, please check labels to avoid  duplicating the same active ingredients  **take medication as directed on the label  ** Do not exceed 4000 mg of tylenol in 24 hours  **Do not take medications that contain aspirin or ibuprofen            Considering Waterbirth? Guide for patients at [facility]) Why consider waterbirth? Gentle birth for babies  Less pain medicine used in labor  May allow for passive descent/less pushing  May reduce perineal tears  More mobility and instinctive maternal position changes  Increased maternal relaxation   Is waterbirth safe? What are the risks of infection, drowning or other complications? Infection:  Very low risk (3.7 % for tub vs 4.8% for bed)  7 in 8000 waterbirths with documented infection  Poorly cleaned equipment most common cause  Slightly lower group B strep transmission rate  Drowning  Maternal:  Very low risk  Related to seizures or fainting  Newborn:  Very low risk. No evidence of increased risk of respiratory problems in multiple large studies  Physiological protection from breathing under water  Avoid underwater birth if there are any fetal complications  Once baby's head is out of the water, keep it out.  Birth complication  Some reports of cord trauma, but risk decreased by bringing baby to surface gradually  No evidence of increased risk of shoulder dystocia. Mothers  can usually change positions faster in water than in a bed, possibly aiding the maneuvers to free the shoulder.   There are 2 things you MUST do to have a waterbirth with [facility]: Attend a waterbirth class at [facility] at [facility]   3rd Wednesday of every month from 7-9 pm (virtual during COVID) [employer] at [email or web address] or [email or web address] or by calling [phone] Bring [place] the certificate from the class to your prenatal appointment or send via MyChart Meet with a midwife at 36 weeks* to see if you can still plan a  waterbirth and to sign the consent.   *We also recommend that you schedule as many of your prenatal visits with a midwife as possible.    Helpful information: You may want to bring a bathing suit top to the hospital to wear during labor but this is optional.  All other supplies are provided by the hospital. Please arrive at the hospital with signs of active labor, and do not wait at home until late in labor. It takes 45 min- 1 hour for fetal monitoring, and check in to your room to take place, plus transport and filling of the waterbirth tub.    Things that would prevent you from having a waterbirth: Premature, <37wks  Previous cesarean birth  Presence of thick meconium-stained fluid  Multiple gestation (Twins, triplets, etc.)  Uncontrolled diabetes or gestational diabetes requiring medication  Hypertension diagnosed in pregnancy or preexisting hypertension (gestational hypertension, preeclampsia, or chronic hypertension) Fetal growth restriction (your baby measures less than 10th percentile on ultrasound) Heavy vaginal bleeding  Non-reassuring fetal heart rate  Active infection (MRSA, etc.). Group B Strep is NOT a contraindication for waterbirth.  If your labor has to be induced and induction method requires continuous monitoring of the baby's heart rate  Other risks/issues identified by your obstetrical provider   Please remember that birth is unpredictable. Under certain unforeseeable circumstances your provider may advise against giving birth in the tub. These decisions will be made on a case-by-case basis and with the safety of you and your baby as our highest priority.    Updated 08/04/21      CenteringPregnancy is a model of prenatal care that started 30 years ago and is used in about 600 practices around the [place]. You meet with a group of 8-12 women due around the same time as you. In Centering you will have individual time with the provider and meet as a group. There's much more  time for discussion and learning. You will actually have much more time with your provider in Centering than in traditional prenatal care.? You will come directly into the Centering room and will not wait in the lobby so there is no wasted time. You will have 2-hour visits every 4 weeks then every 2 weeks. You will know your Centering prenatal appointments in advance. In your last month of pregnancy, you may also come in for some individual visits. Additional appointments can be scheduled if you need more care. Studies have shown that CenteringPregnancy improves birth outcomes. We have seen especially big improvements in fewer Black women delivering babies who are too small or born too early. Visit the website [facility]Healthcare for more information. Let your provider or clinic staff know if you want to sign up or email [email or web address] for more information.   CenteringPregnancy Video

## 2023-07-04 NOTE — Progress Notes (Signed)
 New OB Intake  I connected with Lori Lam  on 07/04/23 at 10:15 AM EST by MyChart Video Visit and verified that I am speaking with the correct person using two identifiers. Nurse is located at [facility] and pt is located at home.  I discussed the limitations, risks, security and privacy concerns of performing an evaluation and management service by telephone and the availability of in person appointments. I also discussed with the patient that there may be a patient responsible charge related to this service. The patient expressed understanding and agreed to proceed.  I explained I am completing New OB Intake today. We discussed EDD of 01/29/2024, Date entered prior to episode creation. Pt is [id number]. I reviewed her allergies, medications and Medical/Surgical/OB history.    Patient Active Problem List   Diagnosis Date Noted   BMI 38.0-38.9,adult 08/16/2021   Asthma 12/28/2012   Depression 12/28/2012   Marijuana use 12/28/2012    Concerns addressed today  Delivery Plans Plans to deliver at [facility]. Discussed the nature of our practice with multiple providers including residents and students. Due to the size of the practice, the delivering provider may not be the same as those providing prenatal care.   Patient  may be  interested in water birth. Offered upcoming OB visit with CNM to discuss further.  MyChart/Babyscripts MyChart access verified. I explained pt will have some visits in office and some virtually. Babyscripts instructions given and order placed. Patient verifies receipt of registration text/e-mail. Account successfully created and app downloaded. If patient is a candidate for Optimized scheduling, add to sticky note.   Blood Pressure Cuff/Weight Scale Patient has private insurance; instructed to purchase blood pressure cuff and bring to first prenatal appt. Explained after first prenatal appt pt will check weekly and document in Babyscripts.  Anatomy US Explained  first scheduled [place] will be around 19 weeks. Anatomy US scheduled for 09/12/2022 at 10:00am.  Is patient a CenteringPregnancy candidate?  Declined Declined due to Declined to say   Is patient a Mom+Baby Combined Care candidate?  Not a candidate   If accepted, confirm patient does not intend to move from the area for at least 12 months, then notify Mom+Baby staff  Interested in [place]? If yes, send referral and doula dot phrase.   Is patient a candidate for Babyscripts Optimization? Yes   First visit review I reviewed new OB appt with patient. Explained pt will be seen by Sharen Counter, CNM at first visit. Discussed Avelina Laine genetic screening with patient. Panorama and Horizon.. Routine prenatal labs is needed at new OB visit.   Last Pap Diagnosis  Date Value Ref Range Status  05/18/2021   Final   - Negative for intraepithelial lesion or malignancy (NILM)    B'Lori Lam, CMA 07/04/2023  10:19 AM

## 2023-07-10 ENCOUNTER — Ambulatory Visit (INDEPENDENT_AMBULATORY_CARE_PROVIDER_SITE_OTHER): Payer: MEDICAID | Admitting: Advanced Practice Midwife

## 2023-07-10 ENCOUNTER — Encounter: Payer: Self-pay | Admitting: Advanced Practice Midwife

## 2023-07-10 ENCOUNTER — Other Ambulatory Visit: Payer: Self-pay

## 2023-07-10 ENCOUNTER — Other Ambulatory Visit (HOSPITAL_COMMUNITY)
Admission: RE | Admit: 2023-07-10 | Discharge: 2023-07-10 | Disposition: A | Discharge status: Home / Self Care | Payer: MEDICAID | Source: Ambulatory Visit | Attending: Advanced Practice Midwife | Admitting: Advanced Practice Midwife

## 2023-07-10 VITALS — BP 119/72 | HR 89 | Wt 267.2 lb

## 2023-07-10 DIAGNOSIS — Z3A11 11 weeks gestation of pregnancy: Secondary | ICD-10-CM

## 2023-07-10 DIAGNOSIS — O0990 Supervision of high risk pregnancy, unspecified, unspecified trimester: Secondary | ICD-10-CM | POA: Diagnosis not present

## 2023-07-10 DIAGNOSIS — Z3491 Encounter for supervision of normal pregnancy, unspecified, first trimester: Secondary | ICD-10-CM | POA: Insufficient documentation

## 2023-07-10 DIAGNOSIS — Z6841 Body Mass Index (BMI) 40.0 and over, adult: Secondary | ICD-10-CM | POA: Insufficient documentation

## 2023-07-10 DIAGNOSIS — O99210 Obesity complicating pregnancy, unspecified trimester: Secondary | ICD-10-CM | POA: Insufficient documentation

## 2023-07-10 LAB — POCT URINALYSIS DIP (DEVICE)
Bilirubin Urine: NEGATIVE
Glucose, UA: NEGATIVE mg/dL
Hgb urine dipstick: NEGATIVE
Ketones, ur: NEGATIVE mg/dL
Leukocytes,Ua: NEGATIVE
Nitrite: NEGATIVE
Protein, ur: NEGATIVE mg/dL
Specific Gravity, Urine: 1.025 (ref 1.005–1.030)
Urobilinogen, UA: 0.2 mg/dL (ref 0.0–1.0)
pH: 6.5 (ref 5.0–8.0)

## 2023-07-10 MED ORDER — ASPIRIN 81 MG PO TBEC
81.0000 mg | DELAYED_RELEASE_TABLET | Freq: Every day | ORAL | 12 refills | Status: DC
Start: 2023-07-10 — End: 2024-01-27

## 2023-07-10 NOTE — Progress Notes (Signed)
 Subjective:   Lori Lam is a 28 y.o. G5P3013 at [redacted]w[redacted]d by LMP, c/w 8 week [place] being seen today for her first obstetrical visit.  Her obstetrical history is significant for  vaginal delivery x 3, FGR with last pregnancy  and has Asthma; Depression; BMI 38.0-38.9,adult; and Supervision of high risk pregnancy, antepartum on their problem list.. Patient does intend to breast feed. Pregnancy history fully reviewed.  Patient reports no complaints.  HISTORY: OB History  Gravida Para Term Preterm AB Living  5 3 3  0 1 3  SAB IAB Ectopic Multiple Live Births  0 1 0 0 3    # Outcome Date GA Lbr Len/2nd Weight Sex Type Anes PTL Lv  5 Current           4 Term 11/08/21 103w2d / 00:06 5 lb 6.8 oz (2.46 kg) F Vag-Spont EPI  LIV     Name: TIANI, STANBERY     Apgar1: 8  Apgar5: 9  3 Term 09/25/16 [redacted]w[redacted]d 17:05 / 00:17 7 lb 11.3 oz (3.495 kg) M Vag-Spont EPI  LIV     Name: ARLESIA, KIEL     Apgar1: 9  Apgar5: 9  2 Term 12/27/12 [redacted]w[redacted]d 15:49 / 00:37 6 lb 12.4 oz (3.073 kg) M Vag-Spont EPI  LIV     Name: Trippe,BOY Titania     Apgar1: 9  Apgar5: 9  1 IAB            Past Medical History:  Diagnosis Date   Anemia    Anxiety    Asthma    Bacterial vaginitis    Chlamydia contact, treated    GERD (gastroesophageal reflux disease)    H/O chlamydia infection 12/28/2012   Treated again 08/2015 Negative 08/30/16   Headache    Mild concussion 12/21/2017   Past Surgical History:  Procedure Laterality Date   HERNIA REPAIR     INSERTION OF IMPLANON ROD  12/08/2021   Family History  Problem Relation Age of Onset   Hypertension Mother    Hypertension Father    Hypertension Maternal Grandmother    Hypertension Maternal Grandfather    Hypertension Paternal Grandmother    Hypertension Other    Diabetes Other    Social History   Tobacco Use   Smoking status: Former    Current packs/day: 0.00    Types: Cigarettes    Quit date: 03/2021    Years since quitting: 2.3   Smokeless  tobacco: Never   Tobacco comments:    2 cigs/day  Vaping Use   Vaping status: Never Used  Substance Use Topics   Alcohol use: Not Currently    Comment: socially, not since confirmed pregnancy   Drug use: No   No Known Allergies Current Outpatient Medications on File Prior to Visit  Medication Sig Dispense Refill   fluticasone (FLONASE) 50 MCG/ACT nasal spray Place 1 spray into both nostrils daily. 16 g 0   ondansetron (ZOFRAN-ODT) 4 MG disintegrating tablet Take 1 tablet (4 mg total) by mouth every 6 (six) hours as needed for nausea. 20 tablet 0   Prenatal Vit-Fe Fumarate-FA (MULTIVITAMIN-PRENATAL) 27-0.8 MG TABS tablet Take 1 tablet by mouth daily at 12 noon.     triamcinolone (KENALOG) 0.025 % ointment Apply 1 Application topically 2 (two) times daily. Apply to areas of rough skin sparingly. Do not use more than 2 times per week 30 g 0   albuterol (VENTOLIN HFA) 108 (90 Base) MCG/ACT inhaler Inhale 2 puffs  into the lungs every 6 (six) hours as needed for wheezing or shortness of breath. 18 g 1   fluticasone-salmeterol (ADVAIR DISKUS) 250-50 MCG/ACT AEPB Inhale 1 puff into the lungs in the morning and at bedtime. (Patient not taking: Reported on 05/26/2023) 1 each 6   loratadine (CLARITIN) 10 MG tablet Take 1 tablet (10 mg total) by mouth daily. (Patient not taking: Reported on 05/26/2023) 30 tablet 6   No current facility-administered medications on file prior to visit.     Indications for ASA therapy (per uptodate) One of the following: Previous pregnancy with preeclampsia, especially early onset and with an adverse outcome No Multifetal gestation No Chronic hypertension No Type 1 or 2 diabetes mellitus No Chronic kidney disease No Autoimmune disease (antiphospholipid syndrome, systemic lupus erythematosus) No   Two or more of the following: Nulliparity No Obesity (body mass index >30 kg/m2) Yes Family history of preeclampsia in mother or sister No Age >=35 years  No Sociodemographic characteristics (African American race, low socioeconomic level) Yes Personal risk factors (eg, previous pregnancy with low birth weight or small for gestational age infant, previous adverse pregnancy outcome [eg, stillbirth], interval >10 years between pregnancies) No   Indications for early 1 hour GTT (per uptodate)  BMI >25 (>23 in Asian women) AND one of the following  Gestational diabetes mellitus in a previous pregnancy No Glycated hemoglobin >=5.7 percent (39 mmol/mol), impaired glucose tolerance, or impaired fasting glucose on previous testing No First-degree relative with diabetes No High-risk race/ethnicity (eg, African American, Latino, Native American, Asian American, Pacific Islander) Yes History of cardiovascular disease No Hypertension or on therapy for hypertension No High-density lipoprotein cholesterol level <35 mg/dL (1.61 mmol/L) and/or a triglyceride level >250 mg/dL (0.96 mmol/L) No Polycystic ovary syndrome No Physical inactivity No Other clinical condition associated with insulin resistance (eg, severe obesity, acanthosis nigricans) No Previous birth of an infant weighing >=4000 g No Previous stillbirth of unknown cause No Exam   Vitals:   07/10/23 0929  BP: 119/72  Pulse: 89  Weight: 267 lb 3.2 oz (121.2 kg)   Fetal Heart Rate (bpm):  (obtained via handheld [place])  VS reviewed, nursing note reviewed,  Constitutional: well developed, well nourished, no distress HEENT: normocephalic CV: normal rate Pulm/chest wall: normal effort Abdomen: soft Neuro: alert and oriented x 3 Skin: warm, dry Psych: affect normal    Assessment:   Pregnancy: [id number] Patient Active Problem List   Diagnosis Date Noted   Supervision of high risk pregnancy, antepartum 07/04/2023   BMI 38.0-38.9,adult 08/16/2021   Asthma 12/28/2012   Depression 12/28/2012     Plan:  1. Encounter for supervision of low-risk pregnancy in first trimester (Primary)  -  CBC/D/Plt+RPR+Rh+ABO+RubIgG... - Culture, OB Urine - GC/Chlamydia probe amp ()not at [facility] PRENATAL TEST  2. [redacted] weeks gestation of pregnancy   3. Obesity affecting pregnancy, antepartum, unspecified obesity type --Discussed growth [place] in pregnancy, BMI > 40, weekly BPPs at 32 weeks  - Hemoglobin A1c - aspirin EC 81 MG tablet; Take 1 tablet (81 mg total) by mouth daily. Swallow whole.  Dispense: 30 tablet; Refill: 12     Initial labs drawn. Continue prenatal vitamins. Discussed and offered genetic screening options, including Quad screen/AFP, NIPS testing, and option to decline testing. Benefits/risks/alternatives reviewed. Pt aware that anatomy US is form of genetic screening with lower accuracy in detecting trisomies than blood work.  Pt chooses genetic screening today. First trimester screen, Quad screen, and NIPS: ordered. Ultrasound discussed; fetal  anatomic survey: ordered. Problem list reviewed and updated. The nature of [facility] with multiple MDs and other Advanced Practice Providers was explained to patient; also emphasized that residents, students are part of our team. Routine obstetric precautions reviewed. Return in about 4 weeks (around 08/07/2023) for LOB, with Sharen Counter available, prefers female providers.   Sharen Counter, CNM 07/10/23 10:29 AM

## 2023-07-10 NOTE — Patient Instructions (Signed)
 Should You Take Baby Aspirin During Pregnancy?   For some moms-to-be, taking a low dose of aspirin before week 16 of pregnancy can lower the risk of preeclampsia and other complications. In This Article If you're pregnant, you've probably been told to avoid taking most pain medications. But there's one notable exception to this rule: Some moms-to-be -- including those with certain high-risk pregnancies -- may be advised to start taking a low dose of aspirin every day beginning in their third or fourth month of pregnancy. Research shows that taking low-dose aspirin (also known as "baby aspirin") during pregnancy if your practitioner recommends it can help prevent preeclampsia -- a pregnancy-related high blood pressure disorder that affects up to 8% of pregnancies and is responsible for causing some harmful complications, according to the [employer] Opens a new window.[1]   While preeclampsia is diagnosed after week 20 of pregnancy but before week 32, it's thought that taking baby aspirin early on -- ideally before week 16 -- can help ward off some of the potential complications of the condition.  "Every person should be screened for preeclampsia early in their pregnancy," says Genice Rouge, D.N.P., R.N.C.-O.B., a spokeswoman for the March of [employer] and its Low Dose, Big Benefits campaign [place] a new window. "Patients should request a screening at their first prenatal care visit . and discuss with their provider whether low-dose aspirin is right for them." You should never take baby aspirin or other medications without your doctor's approval, however, so you'll need to talk to your practitioner about whether you're a candidate first. Here's what you should know about taking low-dose aspirin during pregnancy.  What is baby aspirin? Aspirin is a type of medication that helps bring down fever, pain, and swelling, and prevent blood clots.  In its regular-strength form -- i.e., a 325-milligram dose --  it's given to ease symptoms like headaches, muscle aches, and joint pain. In lower doses, it can be given to help prevent preeclampsia during pregnancy.   In the [place], low-dose aspirin usually comes in 81-milligram form (hence its nickname, "baby aspirin"), although amounts ranging from 60 to 150 milligrams are also considered low-dose. What are the benefits of taking baby aspirin during pregnancy? Studies have shown that for some women with high-risk pregnancies, taking baby aspirin (preferably before 16 weeks of pregnancy) can lower the chance of preeclampsia. Preeclampsia is a type of pregnancy-induced high blood pressure, higher than 140/90 mm Hg and accompanied by protein in the urine, that can cause complications like HELLP syndrome and preterm delivery. Baby aspirin may be especially helpful in warding off "preterm preeclampsia," the kind that occurs before 32 weeks of pregnancy, says Jocelyn Lamer, M.D., a board-certified OB/GYN at [facility] in [place], [place], and a member of the What to Expect Medical Review Board. "Studies show that low-dose aspirin does not change the incidence of term preeclampsia, but decreases the incidence of preterm preeclampsia -- especially at less than [redacted] weeks gestation," Dr. Katrinka Blazing notes. "So that is the main goal. And by decreasing the incidence preterm, the complications of preterm delivery are also avoided in the newborn." Babies who are born prematurely can have a higher risk of health problems, such as newborn jaundice and hearing or vision problems. And preeclampsia can be harmful to the mother too. "Preeclampsia is associated with a higher risk of maternal death as well as preterm birth," says Kem Parkinson, M.D. Opens a new window, a maternal fetal medicine specialist at [facility], in [place], [place].  Preeclampsia is thought to develop in part when the placenta abnormally implants in the  body, where it can then cause chronic inflammation that can lead to complications including high blood pressure. It can also harm the kidneys, liver, and other organs in the mother, according to the March of [employer]. "Low-dose aspirin reduces preeclampsia by inhibiting some of the inflammation that occurs at the placental level," says Dr. Jomarie Longs.  Studies show Opens a new windowthat taking low-dose aspirin to lower the risk of preeclampsia (or delay it as much as possible) can also help prevent preeclampsia-related complications, including preterm birth and intrauterine growth restriction (IUGR). [2] "If you never develop preeclampsia, you're less likely to have a preterm birth, and you're less likely to have a baby that's in the NICU," says McGovern. Who should take baby aspirin during pregnancy?  Your doctor may recommend a course of low-dose aspirin if you have a high risk of preeclampsia, which can be predicted if you have at least one of the following risk factors: A history of preeclampsia  A multiples pregnancy Hypertension, kidney disease, or an autoimmune disease such as lupus. Pre-existing type 1 or type 2 diabetes You may also be a candidate for low-dose aspirin if you have a moderate risk of preeclampsia, which includes more than one of the following risk factors: Obesity A family history of preeclampsia Black race (i.e., associated with systemic racism, which has been linked to environmental and medical inequalities) Lower income Age 30 or older Having in vitro fertilization (IVF)  A history of low birth weight babies or babies born small for their gestational age A history of adverse pregnancy outcomes  A history of miscarriage or stillbirth Are there any risks of taking baby aspirin during pregnancy? If you're allergic to aspirin or other salicylate medications, you may not be a candidate for low-dose aspirin. Otherwise, research shows that taking baby aspirin during pregnancy is  safe as long as your doctor or midwife gives you the green light. "There's very little downside to taking it," says Martha Clan, M.D., department chair and maternal fetal medicine specialist at [facility]. [facility] who co-authored the committee opinion on baby aspirin and pregnancy Opens a new windowfor the [employer] of Obstetricians and Gynecologists (ACOG).[3] There's a small concern that taking aspirin can slightly increase the risk of bleeding, but there are a lot of studies that suggest there is no evidence of an increase in bleeding during or after delivery, says Dr. Baldemar Lenis. Even though there aren't many risks of taking low-dose aspirin during pregnancy, you should always get the okay from your doctor before taking that or any other medication during pregnancy.  When should you start taking baby aspirin during pregnancy? For pregnant women who are at risk of preeclampsia, ACOG recommends taking a low dose of aspirin between 12 and 28 weeks. Some studies show that it's best to start taking it before 16 weeks of pregnancy. For example, one research review published in the American Journal of Obstetrics and Gynecology found that when pregnant women started taking aspirin at or before 16 weeks, the medication was more effective at preventing preeclampsia and fetal growth restriction than when it was started after 16 weeks.[4] "We really want people to start taking low-dose aspirin around that 12-to-16-week mark because that's when the placenta is developing at a fast rate and there's a lot of fetal growth," says McGovern. In all likelihood, that means either you or your doctor will bring  up the subject of whether you're a candidate for low-dose aspirin at your first or second prenatal appointment, around week 8 or week 12, depending on when you see your provider. "I usually see people at a 10-week visit," says Dr. Katrinka Blazing. "I use this visit to counsel on the benefits of adding  a baby aspirin to their daily regimen in the next few weeks -- most of my patients meet criteria, so it has become very routine now." And even if you start taking aspirin after 16 weeks, it may be helpful if your practitioner recommends it for you. "When you think about who bears the highest burden of preeclampsia, we know that it's in people of color and people from low-resource settings who may not present to pregnancy care until 20 weeks," says Dr. Jomarie Longs. "Because of that, there may be [at least a little] benefit to starting aspirin, even that late in pregnancy." Starting a low-dose aspirin regimen after 28 weeks, however, may be too late to prevent preeclampsia from developing, says Dr. Baldemar Lenis. When is the best time of day to take low-dose aspirin? There's no best time of day to take baby aspirin, but some experts recommend taking it at the same time as your prenatal vitamins, mainly so you remember it. Regardless, you should take low-dose aspirin with a glass of water. Does baby aspirin have any side effects during pregnancy? There are some possible side effects of low-dose aspirin, including: Gastrointestinal bleeding or stomach ulcers among people who are taking aspirin every day. Trouble breathing among people who have nasal polyps (or soft growths on the lining of the nose or sinuses). Allergic reactions, including anaphylaxis, among people who are allergic to aspirin. "For the majority of people, aspirin is very safe and very tolerable," says Dr. Jomarie Longs. When should you stop taking low-dose aspirin? Your doctor will likely recommend taking low-dose aspirin until you give birth. In some studies conducted outside the [place] researchers stopped giving pregnant women aspirin at 36 weeks because they worried about the possibility of bleeding, but ACOG recommends taking aspirin up until delivery. While preeclampsia can also appear after you give birth, there's no evidence that low-dose  aspirin can help ward off postpartum preeclampsia too.  Aspirin helps tamp down some of the inflammation at the placenta level, says Dr. Jomarie Longs, but once the placenta is no longer in the body, there may not be any benefits to taking the aspirin. If you're taking low-dose aspirin for another condition, it should be safe to breastfeed while on the medication. Small amounts of aspirin can enter breast milk, but research hasn't found Opens a new windowany negative effects in babies.  "There have been numerous research studies done all the way up to children in elementary school on moms who took low-dose aspirin, and there are really no negative side effects to the fetus," says McGovern. You should avoid taking regular-strength aspirin (325 mg), though, because there are possible risks to the baby, including hemolysis (damage to red blood cells) and a theoretical risk of Reye syndrome (a brain disease), says Dr. Katrinka Blazing. Timing is key, so always talk to your doctor before starting -- or stopping -- a low-dose aspirin regimen. Once you get the green light, though, taking baby aspirin correctly means that you can boost your odds of having a healthy pregnancy.   From the What to Expect editorial team and [facility], author of What to Expect When You're Expecting. What to Expect follows strict reporting guidelines and uses only credible  sources, such as peer-reviewed studies, academic research institutions and highly respected health organizations. Learn how we keep our content accurate and up-to-date by reading our medical review and editorial policy.

## 2023-07-11 ENCOUNTER — Encounter: Payer: Self-pay | Admitting: Obstetrics and Gynecology

## 2023-07-11 LAB — CBC/D/PLT+RPR+RH+ABO+RUBIGG...
Antibody Screen: NEGATIVE
Basophils Absolute: 0 10*3/uL (ref 0.0–0.2)
Basos: 1 %
EOS (ABSOLUTE): 0.1 10*3/uL (ref 0.0–0.4)
Eos: 1 %
HCV Ab: NONREACTIVE
HIV Screen 4th Generation wRfx: NONREACTIVE
Hematocrit: 34.4 % (ref 34.0–46.6)
Hemoglobin: 11.5 g/dL (ref 11.1–15.9)
Hepatitis B Surface Ag: NEGATIVE
Immature Grans (Abs): 0 10*3/uL (ref 0.0–0.1)
Immature Granulocytes: 0 %
Lymphocytes Absolute: 1.9 10*3/uL (ref 0.7–3.1)
Lymphs: 23 %
MCH: 29 pg (ref 26.6–33.0)
MCHC: 33.4 g/dL (ref 31.5–35.7)
MCV: 87 fL (ref 79–97)
Monocytes Absolute: 0.8 10*3/uL (ref 0.1–0.9)
Monocytes: 9 %
Neutrophils Absolute: 5.3 10*3/uL (ref 1.4–7.0)
Neutrophils: 66 %
Platelets: 274 10*3/uL (ref 150–450)
RBC: 3.96 x10E6/uL (ref 3.77–5.28)
RDW: 12.9 % (ref 11.7–15.4)
RPR Ser Ql: NONREACTIVE
Rh Factor: POSITIVE
Rubella Antibodies, IGG: 6.13 {index} (ref >0.99)
WBC: 8.1 10*3/uL (ref 3.4–10.8)

## 2023-07-11 LAB — GC/CHLAMYDIA PROBE AMP (~~LOC~~) NOT AT ARMC
Chlamydia: NEGATIVE
Comment: NEGATIVE
Comment: NORMAL
Neisseria Gonorrhea: NEGATIVE

## 2023-07-11 LAB — URINE CULTURE, OB REFLEX

## 2023-07-11 LAB — CULTURE, OB URINE

## 2023-07-11 LAB — HCV INTERPRETATION

## 2023-07-11 LAB — HEMOGLOBIN A1C
Est. average glucose Bld gHb Est-mCnc: 111 mg/dL
Hgb A1c MFr Bld: 5.5 % (ref 4.8–5.6)

## 2023-07-13 ENCOUNTER — Encounter: Payer: Self-pay | Admitting: Advanced Practice Midwife

## 2023-07-13 ENCOUNTER — Other Ambulatory Visit: Payer: Self-pay | Admitting: Lactation Services

## 2023-07-13 MED ORDER — ADULT BLOOD PRESSURE CUFF LG KIT: 1.0000 | PACK | 0 refills | Status: DC

## 2023-07-13 MED ORDER — GOJJI WEIGHT SCALE MISC: 1.0000 | Freq: Once | 0 refills | Status: AC

## 2023-07-15 LAB — PANORAMA PRENATAL TEST FULL PANEL:PANORAMA TEST PLUS 5 ADDITIONAL MICRODELETIONS: FETAL FRACTION: 5.1

## 2023-08-07 ENCOUNTER — Ambulatory Visit: Payer: MEDICAID | Admitting: Advanced Practice Midwife

## 2023-08-07 ENCOUNTER — Other Ambulatory Visit (HOSPITAL_COMMUNITY)
Admission: RE | Admit: 2023-08-07 | Discharge: 2023-08-07 | Disposition: A | Discharge status: Home / Self Care | Payer: MEDICAID | Source: Ambulatory Visit | Attending: Advanced Practice Midwife | Admitting: Advanced Practice Midwife

## 2023-08-07 ENCOUNTER — Other Ambulatory Visit: Payer: Self-pay

## 2023-08-07 VITALS — BP 119/82 | HR 70 | Wt 264.5 lb

## 2023-08-07 DIAGNOSIS — O0990 Supervision of high risk pregnancy, unspecified, unspecified trimester: Secondary | ICD-10-CM | POA: Diagnosis present

## 2023-08-07 DIAGNOSIS — Z3A28 28 weeks gestation of pregnancy: Secondary | ICD-10-CM

## 2023-08-07 DIAGNOSIS — J302 Other seasonal allergic rhinitis: Secondary | ICD-10-CM | POA: Diagnosis not present

## 2023-08-07 DIAGNOSIS — O0992 Supervision of high risk pregnancy, unspecified, second trimester: Secondary | ICD-10-CM

## 2023-08-07 DIAGNOSIS — O99212 Obesity complicating pregnancy, second trimester: Secondary | ICD-10-CM

## 2023-08-07 DIAGNOSIS — O99210 Obesity complicating pregnancy, unspecified trimester: Secondary | ICD-10-CM

## 2023-08-07 DIAGNOSIS — J4541 Moderate persistent asthma with (acute) exacerbation: Secondary | ICD-10-CM

## 2023-08-07 DIAGNOSIS — Z3A15 15 weeks gestation of pregnancy: Secondary | ICD-10-CM

## 2023-08-07 DIAGNOSIS — N898 Other specified noninflammatory disorders of vagina: Secondary | ICD-10-CM

## 2023-08-07 MED ORDER — FLUTICASONE-SALMETEROL 250-50 MCG/ACT IN AEPB: 1.0000 | INHALATION_SPRAY | Freq: Two times a day (BID) | RESPIRATORY_TRACT | 6 refills | Status: DC

## 2023-08-07 MED ORDER — ONDANSETRON 4 MG PO TBDP: 4.0000 mg | ORAL_TABLET | Freq: Four times a day (QID) | ORAL | 5 refills | Status: DC | PRN

## 2023-08-07 MED ORDER — FLUTICASONE PROPIONATE 50 MCG/ACT NA SUSP: 2.0000 | Freq: Every day | NASAL | 5 refills | Status: AC

## 2023-08-07 MED ORDER — PROMETHAZINE HCL 25 MG PO TABS: 12.5000 mg | ORAL_TABLET | Freq: Every evening | ORAL | 0 refills | Status: DC | PRN

## 2023-08-07 MED ORDER — FAMOTIDINE 20 MG PO TABS
20.0000 mg | ORAL_TABLET | Freq: Two times a day (BID) | ORAL | 5 refills | Status: DC
Start: 2023-08-07 — End: 2023-12-19

## 2023-08-07 MED ORDER — LORATADINE 10 MG PO TABS: 10.0000 mg | ORAL_TABLET | Freq: Every day | ORAL | 6 refills | Status: AC

## 2023-08-07 NOTE — Progress Notes (Cosign Needed)
 PRENATAL VISIT NOTE  Subjective:  Lori Lam is a 28 y.o. [phone] at [redacted]w[redacted]d being seen today for ongoing prenatal care.  She is currently monitored for the following issues for this high-risk pregnancy and has Asthma; Depression; BMI 38.0-38.9,adult; Supervision of high risk pregnancy, antepartum; and Obesity affecting pregnancy, antepartum on their problem list.  Patient reports heartburn, nausea, no bleeding, and vaginal irritation.  Contractions: Not present Movement: Present. Denies leaking of fluid. She reports pressure with urination at night, she reports that she falls asleep instead of going to the bathroom and thinks she has a uti.Patient endorses allergy complaints like itchy eyes, nose, and throat rhinorrhea  The following portions of the patient's history were reviewed and updated as appropriate: allergies, current medications, past family history, past medical history, past social history, past surgical history and problem list.   Objective:   Vitals:   08/07/23 0925  BP: 119/82  Pulse: 70  Weight: 264 lb 8 oz (120 kg)    Fetal Status: Fetal Heart Rate (bpm): 143   Movement: Present     General:  Alert, oriented and cooperative. Patient is in no acute distress.  Skin: Skin is warm and dry. No rash noted.   Cardiovascular: Normal heart rate noted  Respiratory: Normal respiratory effort, no problems with respiration noted  Abdomen: Soft, gravid, appropriate for gestational age.  Pain/Pressure: Present (below belly button and pelvic area)     Pelvic: Cervical exam deferred        Extremities: Normal range of motion.  Edema: None  Mental Status: Normal mood and affect. Normal behavior. Normal judgment and thought content.   Assessment and Plan:  Pregnancy: [id number] at [redacted]w[redacted]d 1. Supervision of high risk pregnancy, antepartum (Primary)   2. Obesity affecting pregnancy, antepartum, unspecified obesity type   3. [redacted] weeks gestation of pregnancy   4. Asthma  affecting pregnancy in third trimester  - loratadine (CLARITIN) 10 MG tablet; Take 1 tablet (10 mg total) by mouth daily.  Dispense: 30 tablet; Refill: 6 - fluticasone-salmeterol (ADVAIR DISKUS) 250-50 MCG/ACT AEPB; Inhale 1 puff into the lungs in the morning and at bedtime.  Dispense: 1 each; Refill: 6  5. Seasonal allergies  - loratadine (CLARITIN) 10 MG tablet; Take 1 tablet (10 mg total) by mouth daily.  Dispense: 30 tablet; Refill: 6 - fluticasone-salmeterol (ADVAIR DISKUS) 250-50 MCG/ACT AEPB; Inhale 1 puff into the lungs in the morning and at bedtime.  Dispense: 1 each; Refill: 6  6. Moderate persistent asthma with acute exacerbation  - loratadine (CLARITIN) 10 MG tablet; Take 1 tablet (10 mg total) by mouth daily.  Dispense: 30 tablet; Refill: 6 - fluticasone-salmeterol (ADVAIR DISKUS) 250-50 MCG/ACT AEPB; Inhale 1 puff into the lungs in the morning and at bedtime.  Dispense: 1 each; Refill: 6  7. [redacted] weeks gestation of pregnancy --Anticipatory guidance about next visits/weeks of pregnancy given.  - loratadine (CLARITIN) 10 MG tablet; Take 1 tablet (10 mg total) by mouth daily.  Dispense: 30 tablet; Refill: 6 - fluticasone-salmeterol (ADVAIR DISKUS) 250-50 MCG/ACT AEPB; Inhale 1 puff into the lungs in the morning and at bedtime.  Dispense: 1 each; Refill: 6  Preterm labor symptoms and general obstetric precautions including but not limited to vaginal bleeding, contractions, leaking of fluid and fetal movement were reviewed in detail with the patient. Please refer to After Visit Summary for other counseling recommendations.  Will call patient if results are abnormal.    Future Appointments  Date Time Provider Department Center  09/05/2023  8:55 AM Conan Bowens, MD [facility]  09/12/2023  1:00 PM [facility] PROVIDER 1 [facility]  09/12/2023  1:30 PM [facility] US1 [facility]    Zenia Resides Student, NP

## 2023-08-07 NOTE — Progress Notes (Signed)
   PRENATAL VISIT NOTE  Subjective:  Lori Lam is a 28 y.o. [phone] at [redacted]w[redacted]d being seen today for ongoing prenatal care.  She is currently monitored for the following issues for this low-risk pregnancy and has Asthma; Depression; BMI 38.0-38.9,adult; Supervision of high risk pregnancy, antepartum; and Obesity affecting pregnancy, antepartum on their problem list.  Patient reports  vaginal discharge, allergy/asthma symptoms off of her meds .  Contractions: Not present.  .  Movement: Present. Denies leaking of fluid.   The following portions of the patient's history were reviewed and updated as appropriate: allergies, current medications, past family history, past medical history, past social history, past surgical history and problem list.   Objective:   Vitals:   08/07/23 0925  BP: 119/82  Pulse: 70  Weight: 264 lb 8 oz (120 kg)    Fetal Status: Fetal Heart Rate (bpm): 143   Movement: Present     General:  Alert, oriented and cooperative. Patient is in no acute distress.  Skin: Skin is warm and dry. No rash noted.   Cardiovascular: Normal heart rate noted  Respiratory: Normal respiratory effort, no problems with respiration noted  Abdomen: Soft, gravid, appropriate for gestational age.  Pain/Pressure: Present (below belly button and pelvic area)     Pelvic: Cervical exam deferred        Extremities: Normal range of motion.  Edema: None  Mental Status: Normal mood and affect. Normal behavior. Normal judgment and thought content.   Assessment and Plan:  Pregnancy: [id number] at [redacted]w[redacted]d 1. Supervision of high risk pregnancy, antepartum (Primary) --Anticipatory guidance about next visits/weeks of pregnancy given.  - Cervicovaginal ancillary only( [place])  2. Obesity affecting pregnancy, antepartum, unspecified obesity type   3. [redacted] weeks gestation of pregnancy   4. Seasonal allergies  - loratadine (CLARITIN) 10 MG tablet; Take 1 tablet (10 mg total) by mouth daily.   Dispense: 30 tablet; Refill: 6 - fluticasone-salmeterol (ADVAIR DISKUS) 250-50 MCG/ACT AEPB; Inhale 1 puff into the lungs in the morning and at bedtime.  Dispense: 1 each; Refill: 6  5. Moderate persistent asthma with acute exacerbation --Medicines prescribed for exacerbation last year, pt ran out. Currently SOB with activity, will restart meds.  - loratadine (CLARITIN) 10 MG tablet; Take 1 tablet (10 mg total) by mouth daily.  Dispense: 30 tablet; Refill: 6 - fluticasone-salmeterol (ADVAIR DISKUS) 250-50 MCG/ACT AEPB; Inhale 1 puff into the lungs in the morning and at bedtime.  Dispense: 1 each; Refill: 6  7. [redacted] weeks gestation of pregnancy  8. Vaginal irritation  - Cervicovaginal ancillary only( [place])    Preterm labor symptoms and general obstetric precautions including but not limited to vaginal bleeding, contractions, leaking of fluid and fetal movement were reviewed in detail with the patient. Please refer to After Visit Summary for other counseling recommendations.   No follow-ups on file.  Future Appointments  Date Time Provider Department Center  09/05/2023  8:55 AM Conan Bowens, MD [facility]  09/12/2023  1:00 PM [facility] PROVIDER 1 [facility]  09/12/2023  1:30 PM [facility] US1 [facility]    Sharen Counter, CNM

## 2023-08-08 ENCOUNTER — Telehealth: Payer: Self-pay | Admitting: Lactation Services

## 2023-08-08 LAB — CERVICOVAGINAL ANCILLARY ONLY
Bacterial Vaginitis (gardnerella): NEGATIVE
Candida Glabrata: NEGATIVE
Candida Vaginitis: NEGATIVE
Chlamydia: NEGATIVE
Comment: NEGATIVE
Comment: NEGATIVE
Comment: NEGATIVE
Comment: NEGATIVE
Comment: NEGATIVE
Comment: NORMAL
Neisseria Gonorrhea: NEGATIVE
Trichomonas: NEGATIVE

## 2023-08-08 NOTE — Telephone Encounter (Signed)
 Awaiting determination  Aline Brochure (Key: Alda Ponder) PA Case ID #: [id number] Rx #: [id number] Need Help? Call us at [phone] Status sent iconSent to Plan today Drug Wixela Inhub 250-50MCG/ACT aerosol powder ePA cloud logo Form [employer] Electronic Prior Authorization Form Original Claim Info 75 Step Therapy: PA/Override Required[PA] .PRIOR DRUG THERAPY REQUIRED Requires trial of preferred products Call Help [facility]kat [phone] for assistance. Marland KitchenMarland Kitchen

## 2023-08-09 ENCOUNTER — Other Ambulatory Visit: Payer: Self-pay | Admitting: Advanced Practice Midwife

## 2023-08-09 DIAGNOSIS — J4541 Moderate persistent asthma with (acute) exacerbation: Secondary | ICD-10-CM

## 2023-08-09 MED ORDER — FLUTICASONE-SALMETEROL 250-50 MCG/ACT IN AEPB
1.0000 | INHALATION_SPRAY | Freq: Two times a day (BID) | RESPIRATORY_TRACT | 1 refills | Status: DC
Start: 2023-08-09 — End: 2023-11-08

## 2023-08-09 NOTE — Progress Notes (Signed)
 Rx for [facility] sent, as Advair is not covered by patient's insurance.

## 2023-08-09 NOTE — Telephone Encounter (Signed)
 Aline Brochure (KeyAlda Ponder) PA Case ID #: [id number] Rx #: [id number] Need Help? Call us at [phone] Outcome Denied on April 8 by [employer] 2017 Denied Drug Wixela Inhub 250-50MCG/ACT aerosol powder ePA cloud logo Form [employer] Electronic Prior Authorization Form Original Claim Info 75 Step Therapy: PA/Override Required[PA] .PRIOR DRUG THERAPY REQUIRED Requires trial of preferred products Call Help Deskat [phone] for assistance. Marland KitchenMarland Kitchen

## 2023-09-05 ENCOUNTER — Ambulatory Visit (INDEPENDENT_AMBULATORY_CARE_PROVIDER_SITE_OTHER): Payer: MEDICAID | Admitting: Obstetrics and Gynecology

## 2023-09-05 ENCOUNTER — Other Ambulatory Visit: Payer: Self-pay

## 2023-09-05 ENCOUNTER — Encounter: Payer: Self-pay | Admitting: Obstetrics and Gynecology

## 2023-09-05 VITALS — BP 124/71 | HR 80 | Wt 259.0 lb

## 2023-09-05 DIAGNOSIS — O0992 Supervision of high risk pregnancy, unspecified, second trimester: Secondary | ICD-10-CM | POA: Diagnosis not present

## 2023-09-05 DIAGNOSIS — O09293 Supervision of pregnancy with other poor reproductive or obstetric history, third trimester: Secondary | ICD-10-CM | POA: Insufficient documentation

## 2023-09-05 DIAGNOSIS — Z3A19 19 weeks gestation of pregnancy: Secondary | ICD-10-CM | POA: Diagnosis not present

## 2023-09-05 DIAGNOSIS — Z8759 Personal history of other complications of pregnancy, childbirth and the puerperium: Secondary | ICD-10-CM | POA: Insufficient documentation

## 2023-09-05 DIAGNOSIS — Z6838 Body mass index (BMI) 38.0-38.9, adult: Secondary | ICD-10-CM

## 2023-09-05 DIAGNOSIS — O99210 Obesity complicating pregnancy, unspecified trimester: Secondary | ICD-10-CM

## 2023-09-05 DIAGNOSIS — O99212 Obesity complicating pregnancy, second trimester: Secondary | ICD-10-CM | POA: Diagnosis not present

## 2023-09-05 DIAGNOSIS — O09299 Supervision of pregnancy with other poor reproductive or obstetric history, unspecified trimester: Secondary | ICD-10-CM | POA: Insufficient documentation

## 2023-09-05 DIAGNOSIS — J4520 Mild intermittent asthma, uncomplicated: Secondary | ICD-10-CM

## 2023-09-05 DIAGNOSIS — O0990 Supervision of high risk pregnancy, unspecified, unspecified trimester: Secondary | ICD-10-CM

## 2023-09-05 DIAGNOSIS — R252 Cramp and spasm: Secondary | ICD-10-CM

## 2023-09-05 DIAGNOSIS — O09292 Supervision of pregnancy with other poor reproductive or obstetric history, second trimester: Secondary | ICD-10-CM

## 2023-09-05 MED ORDER — FERROUS SULFATE 325 (65 FE) MG PO TBEC: 325.0000 mg | DELAYED_RELEASE_TABLET | ORAL | 1 refills | Status: DC

## 2023-09-05 NOTE — Progress Notes (Signed)
   PRENATAL VISIT NOTE  Subjective:  Lori Lam is a 28 y.o. [phone] at [redacted]w[redacted]d being seen today for ongoing prenatal care.  She is currently monitored for the following issues for this high-risk pregnancy and has Asthma; Depression; BMI 38.0-38.9,adult; Supervision of high risk pregnancy, antepartum; Obesity affecting pregnancy, antepartum; and History of fetal abnormality in previous pregnancy, currently pregnant on their problem list.  Patient reports  leg cramping .  Contractions: Not present. Vag. Bleeding: None.  Movement: Present. Denies leaking of fluid.   The following portions of the patient's history were reviewed and updated as appropriate: allergies, current medications, past family history, past medical history, past social history, past surgical history and problem list.   Objective:   Vitals:   09/05/23 0903  BP: 124/71  Pulse: 80  Weight: 259 lb (117.5 kg)    Fetal Status: Fetal Heart Rate (bpm): 140   Movement: Present     General:  Alert, oriented and cooperative. Patient is in no acute distress.  Skin: Skin is warm and dry. No rash noted.   Cardiovascular: Normal heart rate noted  Respiratory: Normal respiratory effort, no problems with respiration noted  Abdomen: Soft, gravid, appropriate for gestational age.  Pain/Pressure: Absent     Pelvic: Cervical exam deferred        Extremities: Normal range of motion.  Edema: None  Mental Status: Normal mood and affect. Normal behavior. Normal judgment and thought content.   Assessment and Plan:  Pregnancy: [id number] at [redacted]w[redacted]d  1. [redacted] weeks gestation of pregnancy (Primary)  2. Supervision of high risk pregnancy, antepartum  3. BMI 38.0-38.9,adult  4. Obesity affecting pregnancy, antepartum, unspecified obesity type Cont baby aspirin   5. Mild intermittent asthma without complication Had wixela (advair equivalent) Rx last visit for SOB symptoms Improved shortness of breath symptoms  6. History of fetal  abnormality in previous pregnancy, currently pregnant Fetal growth restriction in last pregnancy Will need regular growth US   7. Leg cramping Start every other day iron   Preterm labor symptoms and general obstetric precautions including but not limited to vaginal bleeding, contractions, leaking of fluid and fetal movement were reviewed in detail with the patient. Please refer to After Visit Summary for other counseling recommendations.   Return in about 1 month (around 10/06/2023) for high OB.  Future Appointments  Date Time Provider Department Center  09/12/2023  1:00 PM [facility] PROVIDER 1 [facility]  09/12/2023  1:30 PM [facility] US1 [facility]  10/03/2023 11:15 AM Jan Mcgill, MD [facility]    Jan Mcgill, MD

## 2023-09-07 ENCOUNTER — Encounter: Payer: Self-pay | Admitting: Obstetrics and Gynecology

## 2023-09-07 LAB — AFP, SERUM, OPEN SPINA BIFIDA
AFP MoM: 0.99
AFP Value: 32.8 ng/mL
Gest. Age on Collection Date: 17.7 wk
Maternal Age At EDD: 30.1 a
OSBR Risk 1 IN: 10000
Test Results:: NEGATIVE
Weight: 259 [lb_av]

## 2023-09-12 ENCOUNTER — Other Ambulatory Visit: Payer: MEDICAID

## 2023-09-12 ENCOUNTER — Ambulatory Visit: Payer: MEDICAID | Attending: Advanced Practice Midwife

## 2023-09-12 ENCOUNTER — Ambulatory Visit (HOSPITAL_BASED_OUTPATIENT_CLINIC_OR_DEPARTMENT_OTHER): Payer: MEDICAID | Admitting: Maternal & Fetal Medicine

## 2023-09-12 ENCOUNTER — Ambulatory Visit: Payer: MEDICAID

## 2023-09-12 ENCOUNTER — Other Ambulatory Visit: Payer: Self-pay | Admitting: *Deleted

## 2023-09-12 VITALS — BP 130/70 | HR 82

## 2023-09-12 DIAGNOSIS — O99212 Obesity complicating pregnancy, second trimester: Secondary | ICD-10-CM

## 2023-09-12 DIAGNOSIS — O09292 Supervision of pregnancy with other poor reproductive or obstetric history, second trimester: Secondary | ICD-10-CM

## 2023-09-12 DIAGNOSIS — O0990 Supervision of high risk pregnancy, unspecified, unspecified trimester: Secondary | ICD-10-CM | POA: Diagnosis present

## 2023-09-12 DIAGNOSIS — Z6838 Body mass index (BMI) 38.0-38.9, adult: Secondary | ICD-10-CM

## 2023-09-12 DIAGNOSIS — Z8759 Personal history of other complications of pregnancy, childbirth and the puerperium: Secondary | ICD-10-CM

## 2023-09-12 DIAGNOSIS — E669 Obesity, unspecified: Secondary | ICD-10-CM | POA: Diagnosis not present

## 2023-09-12 DIAGNOSIS — O09299 Supervision of pregnancy with other poor reproductive or obstetric history, unspecified trimester: Secondary | ICD-10-CM | POA: Diagnosis present

## 2023-09-12 DIAGNOSIS — O99213 Obesity complicating pregnancy, third trimester: Secondary | ICD-10-CM

## 2023-09-12 DIAGNOSIS — Z3A20 20 weeks gestation of pregnancy: Secondary | ICD-10-CM

## 2023-09-12 NOTE — Progress Notes (Signed)
 Patient information  Patient Name: Lori Lam  Patient MRN:   [id number]  Referring practice: MFM Referring Provider: [facility])  MFM CONSULT  Lori Lam is a 28 y.o. [phone] at [redacted]w[redacted]d here for ultrasound and consultation. Patient Active Problem List   Diagnosis Date Noted   History of fetal abnormality in previous pregnancy, currently pregnant 09/05/2023   Obesity affecting pregnancy, antepartum 07/10/2023   Supervision of high risk pregnancy, antepartum 07/04/2023   BMI 38.0-38.9,adult 08/16/2021   Asthma 12/28/2012   Depression 12/28/2012   Lori Lam has a pregnancy with the complications mentioned in the problem list. During today's visit we focused on the following concerns:   RE hx of FGR: Most recent pregnancy was FGR at 5lb7oz at 37 weeks.  There is approximately a 15% chance of recurrence in subsequent pregnancies.  There is no proven intervention to do reduce this risk of the event minimizing the impact of maternal health problems that may impact fetal growth.  Avoidance of certain substances like tobacco and marijuana is also encouraged.  81 mg of aspirin  may be helpful to reduce the risk of fetal growth restriction and preeclampsia but the effect on fetal growth restriction is unproven.   Sonographic findings Single intrauterine pregnancy at 20w 1d. Fetal cardiac activity:  Observed and appears normal. Presentation: Cephalic. The anatomic structures that were well seen appear normal without evidence of soft markers. The anatomic survey is complete.  Fetal biometry shows the estimated fetal weight at the 44 percentile. Amniotic fluid: Within normal limits.  MVP: 5.05 cm. Placenta: Posterior. Adnexa: No abnormality visualized. Cervical length: 4.2 cm.  There are limitations of prenatal ultrasound such as the inability to detect certain abnormalities due to poor visualization. Various factors such as fetal position,  gestational age and maternal body habitus may increase the difficulty in visualizing the fetal anatomy.    Recommendations -EDD should be 01/29/2024 based on  LMP  (04/24/23). -Serial growth ultrasounds starting around 28 weeks to monitor for fetal growth restriction. -Continue routine prenatal care with referring OB provider.  Review of Systems: A review of systems was performed and was negative except per HPI   Vitals and Physical Exam    09/12/2023    1:37 PM 09/05/2023    9:03 AM 08/07/2023    9:25 AM  Vitals with BMI  Weight  259 lbs 264 lbs 8 oz  Systolic 130 124 147  Diastolic 70 71 82  Pulse 82 80 70    Sitting comfortably on the sonogram table Nonlabored breathing Normal rate and rhythm Abdomen is nontender  Past pregnancies OB History  Gravida Para Term Preterm AB Living  5 3 3  0 1 3  SAB IAB Ectopic Multiple Live Births  0 1 0 0 3    # Outcome Date GA Lbr Len/2nd Weight Sex Type Anes PTL Lv  5 Current           4 Term 11/08/21 [redacted]w[redacted]d / 00:06 5 lb 6.8 oz (2.46 kg) F Vag-Spont EPI  LIV  3 Term 09/25/16 [redacted]w[redacted]d 17:05 / 00:17 7 lb 11.3 oz (3.495 kg) M Vag-Spont EPI  LIV  2 Term 12/27/12 [redacted]w[redacted]d 15:49 / 00:37 6 lb 12.4 oz (3.073 kg) M Vag-Spont EPI  LIV  1 IAB              I spent 30 minutes reviewing the patients chart, including labs and images as well as counseling the patient  about her medical conditions. Greater than 50% of the time was spent in direct face-to-face patient counseling.  Penney Bowling, DO Maternal fetal medicine, [place]   09/12/2023  2:01 PM

## 2023-10-03 ENCOUNTER — Encounter: Payer: MEDICAID | Admitting: Obstetrics and Gynecology

## 2023-10-11 ENCOUNTER — Ambulatory Visit: Payer: MEDICAID | Admitting: Family Medicine

## 2023-10-11 ENCOUNTER — Other Ambulatory Visit: Payer: Self-pay

## 2023-10-11 VITALS — BP 95/62 | HR 81 | Wt 259.0 lb

## 2023-10-11 DIAGNOSIS — O0990 Supervision of high risk pregnancy, unspecified, unspecified trimester: Secondary | ICD-10-CM

## 2023-10-11 DIAGNOSIS — O99212 Obesity complicating pregnancy, second trimester: Secondary | ICD-10-CM | POA: Diagnosis not present

## 2023-10-11 DIAGNOSIS — Z3A24 24 weeks gestation of pregnancy: Secondary | ICD-10-CM | POA: Diagnosis not present

## 2023-10-11 DIAGNOSIS — O09299 Supervision of pregnancy with other poor reproductive or obstetric history, unspecified trimester: Secondary | ICD-10-CM

## 2023-10-11 NOTE — Progress Notes (Signed)
   PRENATAL VISIT NOTE  Subjective:  Lori Lam is a 28 y.o. [phone] at [redacted]w[redacted]d being seen today for ongoing prenatal care.  She is currently monitored for the following issues for this high-risk pregnancy and has Asthma; Depression; BMI 38.0-38.9,adult; Supervision of high risk pregnancy, antepartum; Obesity affecting pregnancy, antepartum; and History of fetal abnormality in previous pregnancy, currently pregnant on their problem list.  Patient reports no complaints.  Contractions: Not present. Vag. Bleeding: None.  Movement: Present. Denies leaking of fluid.   The following portions of the patient's history were reviewed and updated as appropriate: allergies, current medications, past family history, past medical history, past social history, past surgical history and problem list.   Objective:    Vitals:   10/11/23 1113  BP: 95/62  Pulse: 81  Weight: 259 lb (117.5 kg)    Fetal Status:  Fetal Heart Rate (bpm): 139 Fundal Height: 24 cm Movement: Present    General: Alert, oriented and cooperative. Patient is in no acute distress.  Skin: Skin is warm and dry. No rash noted.   Cardiovascular: Normal heart rate noted  Respiratory: Normal respiratory effort, no problems with respiration noted  Abdomen: Soft, gravid, appropriate for gestational age.  Pain/Pressure: Present (round ligament pain, pressure)     Pelvic: Cervical exam deferred        Extremities: Normal range of motion.  Edema: None  Mental Status: Normal mood and affect. Normal behavior. Normal judgment and thought content.   Assessment and Plan:  Pregnancy: G5P3013 at [redacted]w[redacted]d 1. Supervision of high risk pregnancy, antepartum (Primary) Up to date Feeling movement No questions/concerns-- interested in support group and also wondering about nipple sizing  2. History of fetal abnormality in previous pregnancy, currently pregnant US  WNL this pregnancy FGR prior pregnancy  3. Severe obesity due to excess calories  affecting pregnancy, antepartum (HCC) TWG=-13 lb (-5.897 kg)   4. [redacted] weeks gestation of pregnancy  Preterm labor symptoms and general obstetric precautions including but not limited to vaginal bleeding, contractions, leaking of fluid and fetal movement were reviewed in detail with the patient. Please refer to After Visit Summary for other counseling recommendations.   Return in about 4 weeks (around 11/08/2023) for Routine prenatal care, 28 wk labs, GTT.  Future Appointments  Date Time Provider Department Center  11/07/2023 11:00 AM [facility] PROVIDER 1 [facility]  11/07/2023 11:30 AM [facility] US3 [facility]    Abner Ables, MD

## 2023-10-11 NOTE — Progress Notes (Signed)
 LC's to the room per provider request, mom has questions about nipple sizing.  LC's invited mom to the lactation support group. Reviewed nipple changes during pregnancy and after birth. Mom stated she just want to know how to measure her nipples and how may sizes up or down should she go when pick flange inserts. Mom mentioned she has a Sectra s1, elvie and manual madela pump at home, non were obtained via insurance. Encouraged mom to order pump via insurance.   Daequan Kozma IBCLC and [employer] IBCLC

## 2023-11-07 ENCOUNTER — Other Ambulatory Visit: Payer: Self-pay | Admitting: *Deleted

## 2023-11-07 ENCOUNTER — Ambulatory Visit: Payer: MEDICAID

## 2023-11-07 ENCOUNTER — Ambulatory Visit: Payer: MEDICAID | Attending: Maternal & Fetal Medicine | Admitting: Obstetrics and Gynecology

## 2023-11-07 VITALS — BP 129/60

## 2023-11-07 DIAGNOSIS — E669 Obesity, unspecified: Secondary | ICD-10-CM | POA: Diagnosis not present

## 2023-11-07 DIAGNOSIS — O99213 Obesity complicating pregnancy, third trimester: Secondary | ICD-10-CM

## 2023-11-07 DIAGNOSIS — O09293 Supervision of pregnancy with other poor reproductive or obstetric history, third trimester: Secondary | ICD-10-CM | POA: Insufficient documentation

## 2023-11-07 DIAGNOSIS — Z8759 Personal history of other complications of pregnancy, childbirth and the puerperium: Secondary | ICD-10-CM

## 2023-11-07 DIAGNOSIS — Z3A28 28 weeks gestation of pregnancy: Secondary | ICD-10-CM | POA: Diagnosis not present

## 2023-11-07 DIAGNOSIS — E6689 Other obesity not elsewhere classified: Secondary | ICD-10-CM | POA: Diagnosis not present

## 2023-11-07 DIAGNOSIS — Z363 Encounter for antenatal screening for malformations: Secondary | ICD-10-CM | POA: Insufficient documentation

## 2023-11-07 DIAGNOSIS — O0990 Supervision of high risk pregnancy, unspecified, unspecified trimester: Secondary | ICD-10-CM

## 2023-11-07 NOTE — Progress Notes (Signed)
 Maternal-Fetal Medicine Consultation Name: Lori Lam MRN: [id number]  G5 P3013 at 28w 1d gestation.  Patient is here for fetal growth assessment. Pregravid BMI 47. History of fetal growth restriction  Ultrasound Fetal growth is appropriate for gestational age.  Amniotic fluid normal and good fetal activity seen. I reassured the patient of normal fetal growth assessment.  Pregravid BMI Grade 3 obesity is independently associated with increased risk of stillbirth (2.5- to 3-fold), but the absolute risk is very small.  I discussed protocol of weekly antenatal testing from [redacted] weeks gestation until delivery. Maternal obesity imposes limitations on the resolution of images and fetal anomalies may be missed. Recommendations - An appointment was made for her to return in 6 weeks for fetal growth assessment and BPP. - Weekly antenatal testing from [redacted] weeks gestation until delivery.  Consultation including face-to-face (more than 50%) counseling 10 minutes.

## 2023-11-08 ENCOUNTER — Ambulatory Visit: Payer: MEDICAID | Admitting: Certified Nurse Midwife

## 2023-11-08 ENCOUNTER — Other Ambulatory Visit: Payer: Self-pay

## 2023-11-08 ENCOUNTER — Other Ambulatory Visit: Payer: MEDICAID

## 2023-11-08 VITALS — BP 108/76 | HR 90 | Wt 256.7 lb

## 2023-11-08 DIAGNOSIS — Z3A28 28 weeks gestation of pregnancy: Secondary | ICD-10-CM

## 2023-11-08 DIAGNOSIS — O0993 Supervision of high risk pregnancy, unspecified, third trimester: Secondary | ICD-10-CM

## 2023-11-08 DIAGNOSIS — O0990 Supervision of high risk pregnancy, unspecified, unspecified trimester: Secondary | ICD-10-CM

## 2023-11-08 DIAGNOSIS — Z23 Encounter for immunization: Secondary | ICD-10-CM

## 2023-11-08 NOTE — Progress Notes (Signed)
   PRENATAL VISIT NOTE  Subjective:  Lori Lam is a 28 y.o. [phone] at [redacted]w[redacted]d being seen today for ongoing prenatal care.  She is currently monitored for the following issues for this {Blank single:19197::high-risk,low-risk} pregnancy and has Asthma; Depression; BMI 38.0-38.9,adult; Supervision of high-risk pregnancy; and History of fetal abnormality in previous pregnancy, currently pregnant on their problem list.  Patient reports {sx:14538}.  Contractions: Not present. Vag. Bleeding: None.  Movement: Present. Denies leaking of fluid.   The following portions of the patient's history were reviewed and updated as appropriate: allergies, current medications, past family history, past medical history, past social history, past surgical history and problem list.   Objective:    Vitals:   11/08/23 0842  BP: 108/76  Pulse: 90  Weight: 256 lb 11.2 oz (116.4 kg)    Fetal Status:  Fetal Heart Rate (bpm): 138 Fundal Height: 28 cm Movement: Present Presentation: Transverse  General: Alert, oriented and cooperative. Patient is in no acute distress.  Skin: Skin is warm and dry. No rash noted.   Cardiovascular: Normal heart rate noted  Respiratory: Normal respiratory effort, no problems with respiration noted  Abdomen: Soft, gravid, appropriate for gestational age.  Pain/Pressure: Present     Pelvic: {Blank single:19197::Cervical exam performed in the presence of a chaperone,Cervical exam deferred}        Extremities: Normal range of motion.  Edema: Trace  Mental Status: Normal mood and affect. Normal behavior. Normal judgment and thought content.   Assessment and Plan:  Pregnancy: G5P3013 at [redacted]w[redacted]d 1. Supervision of high risk pregnancy in third trimester (Primary) ***  2. [redacted] weeks gestation of pregnancy ***  3.   {Blank single:19197::Term,Preterm} labor symptoms and general obstetric precautions including but not limited to vaginal bleeding, contractions, leaking of  fluid and fetal movement were reviewed in detail with the patient. Please refer to After Visit Summary for other counseling recommendations.   Return in about 2 weeks (around 11/22/2023) for IN-PERSON, LOB.  Future Appointments  Date Time Provider Department Center  12/06/2023  9:35 AM Vannie Cornell JONELLE EDDY [facility]  12/19/2023  9:00 AM [facility] PROVIDER 1 [facility]  12/19/2023  9:30 AM [facility] US3 [facility]  12/26/2023 11:00 AM [facility] PROVIDER 1 [facility]  12/26/2023 11:30 AM [facility] US2 [facility]  01/02/2024 11:00 AM [facility] PROVIDER 1 [facility]  01/02/2024 11:30 AM [facility] US2 [facility]    Cornell JONELLE Vannie, CNM

## 2023-11-09 LAB — GLUCOSE TOLERANCE, 2 HOURS W/ 1HR
Glucose, 1 hour: 102 mg/dL (ref 70–179)
Glucose, 2 hour: 86 mg/dL (ref 70–152)
Glucose, Fasting: 77 mg/dL (ref 70–91)

## 2023-11-09 LAB — RPR, QUANT+TP ABS (REFLEX)
Rapid Plasma Reagin, Quant: 1:1 {titer} — ABNORMAL HIGH
T Pallidum Abs: NONREACTIVE

## 2023-11-09 LAB — CBC
Hematocrit: 33.1 % — ABNORMAL LOW (ref 34.0–46.6)
Hemoglobin: 10.8 g/dL — ABNORMAL LOW (ref 11.1–15.9)
MCH: 28.7 pg (ref 26.6–33.0)
MCHC: 32.6 g/dL (ref 31.5–35.7)
MCV: 88 fL (ref 79–97)
Platelets: 281 x10E3/uL (ref 150–450)
RBC: 3.76 x10E6/uL — ABNORMAL LOW (ref 3.77–5.28)
RDW: 13.2 % (ref 11.7–15.4)
WBC: 9.8 x10E3/uL (ref 3.4–10.8)

## 2023-11-09 LAB — RPR: RPR Ser Ql: REACTIVE — AB

## 2023-11-09 LAB — HIV ANTIBODY (ROUTINE TESTING W REFLEX): HIV Screen 4th Generation wRfx: NONREACTIVE

## 2023-11-10 ENCOUNTER — Telehealth: Payer: Self-pay | Admitting: *Deleted

## 2023-11-10 NOTE — Telephone Encounter (Signed)
 Patient left a voice message stating she needs a confirmatory screening test for a positive result that may be a false positive. Rock Skip PEAK

## 2023-11-14 ENCOUNTER — Other Ambulatory Visit: Payer: Self-pay

## 2023-11-14 ENCOUNTER — Ambulatory Visit: Payer: Self-pay | Admitting: Family Medicine

## 2023-11-14 NOTE — Telephone Encounter (Addendum)
 After chart review, RN called pt to discuss false positive RPR and plan for confirmation redraw in 2-4 weeks per Lori Lam, CNM.  RN explained to pt that the non-reactive T Pallidum test is the confirmatory test that indicates the RPR was a false positive and that the provider recommends testing again 2-4 weeks from the last draw. After consulting with Dr. Lola,  Pt was agreeable to having labs drawn at her OB appointment on 11/23/23 which is 2.1 weeks from false positive date.  Pt also inquired about her CBC levels alerting abnormal in MyChart.  Advised pt that her levels are very close to normal range and are not truly concerning levels at this time during her pregnancy.  Reminded pt of OB appointment on 11/23/23 at 08:55 with Dr. Lola.  Pt verbalized understanding and had no further questions.    [place], RN

## 2023-11-23 ENCOUNTER — Encounter: Payer: Self-pay | Admitting: Family Medicine

## 2023-11-23 ENCOUNTER — Ambulatory Visit: Payer: MEDICAID | Admitting: Family Medicine

## 2023-11-23 ENCOUNTER — Other Ambulatory Visit: Payer: Self-pay

## 2023-11-23 VITALS — BP 121/82 | HR 74 | Wt 257.9 lb

## 2023-11-23 DIAGNOSIS — Z3A30 30 weeks gestation of pregnancy: Secondary | ICD-10-CM | POA: Diagnosis not present

## 2023-11-23 DIAGNOSIS — Z6838 Body mass index (BMI) 38.0-38.9, adult: Secondary | ICD-10-CM

## 2023-11-23 DIAGNOSIS — O0993 Supervision of high risk pregnancy, unspecified, third trimester: Secondary | ICD-10-CM | POA: Diagnosis not present

## 2023-11-23 NOTE — Progress Notes (Signed)
   PRENATAL VISIT NOTE  Subjective:  Lori Lam is a 28 y.o. [phone] at [redacted]w[redacted]d being seen today for ongoing prenatal care.  She is currently monitored for the following issues for this high-risk pregnancy and has Asthma; Depression; BMI 38.0-38.9,adult; Supervision of high-risk pregnancy; and History of fetal abnormality in previous pregnancy, currently pregnant on their problem list.  Patient reports no complaints.  Contractions: Irritability. Vag. Bleeding: None.  Movement: Present. Denies leaking of fluid.   The following portions of the patient's history were reviewed and updated as appropriate: allergies, current medications, past family history, past medical history, past social history, past surgical history and problem list.   Objective:    Vitals:   11/23/23 0926  BP: 121/82  Pulse: 74  Weight: 117 kg    Fetal Status:  Fetal Heart Rate (bpm): 130   Movement: Present    General: Alert, oriented and cooperative. Patient is in no acute distress.  Skin: Skin is warm and dry. No rash noted.   Cardiovascular: Normal heart rate noted  Respiratory: Normal respiratory effort, no problems with respiration noted  Abdomen: Soft, gravid, appropriate for gestational age.  Pain/Pressure: Present     Pelvic: Cervical exam deferred        Extremities: Normal range of motion.  Edema: None  Mental Status: Normal mood and affect. Normal behavior. Normal judgment and thought content.   Assessment and Plan:  Pregnancy: [id number] at [redacted]w[redacted]d 1. Supervision of high risk pregnancy in third trimester (Primary) Doing well. Reports some occasional [name]. BPP and F/U US  scheduled for 12/19/23 F/U Routine OB visit 8/6  2. BMI 38.0-38.9,adult BPP scheduled 12/19/23  Preterm labor symptoms and general obstetric precautions including but not limited to vaginal bleeding, contractions, leaking of fluid and fetal movement were reviewed in detail with the patient. Please refer to After Visit  Summary for other counseling recommendations.   No follow-ups on file.  Future Appointments  Date Time Provider Department Center  12/06/2023  9:35 AM Vannie Cornell JONELLE EDDY [facility]  12/19/2023  9:00 AM [facility] PROVIDER 1 [facility]  12/19/2023  9:30 AM [facility] US3 [facility]  12/20/2023  9:35 AM Vannie Cornell JONELLE, CNM [facility]  12/26/2023 11:00 AM [facility] PROVIDER 1 [facility]  12/26/2023 11:30 AM [facility] US2 [facility]  01/02/2024 11:00 AM [facility] PROVIDER 1 [facility]  01/02/2024 11:30 AM [facility] US2 [facility]  01/03/2024  2:35 PM Vannie Cornell JONELLE, CNM [facility]  01/17/2024  9:35 AM Vannie Cornell JONELLE, CNM [facility]  01/24/2024 10:15 AM Vannie Cornell JONELLE, CNM [facility]  01/31/2024  9:35 AM Vannie Cornell JONELLE, CNM [facility]  02/07/2024  9:35 AM Vannie Cornell JONELLE, CNM [facility]  02/14/2024  9:35 AM Vannie Cornell JONELLE, CNM [facility]    Derrek DOROTHA Freund, NP Student

## 2023-11-23 NOTE — Patient Instructions (Signed)

## 2023-12-06 ENCOUNTER — Other Ambulatory Visit: Payer: Self-pay

## 2023-12-06 ENCOUNTER — Ambulatory Visit: Payer: MEDICAID | Admitting: Certified Nurse Midwife

## 2023-12-06 VITALS — BP 110/74 | HR 91 | Wt 258.8 lb

## 2023-12-06 DIAGNOSIS — O0993 Supervision of high risk pregnancy, unspecified, third trimester: Secondary | ICD-10-CM

## 2023-12-06 DIAGNOSIS — Z3A32 32 weeks gestation of pregnancy: Secondary | ICD-10-CM | POA: Diagnosis not present

## 2023-12-12 NOTE — Progress Notes (Signed)
   PRENATAL VISIT NOTE  Subjective:  Lori Lam is a 28 y.o. [phone] at [redacted]w[redacted]d being seen today for ongoing prenatal care.  She is currently monitored for the following issues for this high-risk pregnancy and has Asthma; Depression; BMI 38.0-38.9,adult; Supervision of high-risk pregnancy; and History of fetal abnormality in previous pregnancy, currently pregnant on their problem list.  Patient reports no complaints.  Contractions: Irritability. Vag. Bleeding: None.  Movement: Present. Denies leaking of fluid.   The following portions of the patient's history were reviewed and updated as appropriate: allergies, current medications, past family history, past medical history, past social history, past surgical history and problem list.   Objective:    Vitals:   12/06/23 0953  BP: 110/74  Pulse: 91  Weight: 258 lb 12.8 oz (117.4 kg)    Fetal Status:  Fetal Heart Rate (bpm): 134 Fundal Height: 33 cm Movement: Present    General: Alert, oriented and cooperative. Patient is in no acute distress.  Skin: Skin is warm and dry. No rash noted.   Cardiovascular: Normal heart rate noted  Respiratory: Normal respiratory effort, no problems with respiration noted  Abdomen: Soft, gravid, appropriate for gestational age.  Pain/Pressure: Present     Pelvic: Cervical exam deferred        Extremities: Normal range of motion.  Edema: None  Mental Status: Normal mood and affect. Normal behavior. Normal judgment and thought content.   Assessment and Plan:  Pregnancy: [id number] at [redacted]w[redacted]d 1. Supervision of high risk pregnancy in third trimester (Primary) - Doing well, feeling regular and vigorous fetal movement   2. [redacted] weeks gestation of pregnancy - Routine OB care  - Discussed false positive RPR, pt reassured.  Preterm labor symptoms and general obstetric precautions including but not limited to vaginal bleeding, contractions, leaking of fluid and fetal movement were reviewed in detail with the  patient. Please refer to After Visit Summary for other counseling recommendations.   Return in about 2 weeks (around 12/20/2023) for IN-PERSON, HOB.  Future Appointments  Date Time Provider Department Center  12/19/2023  9:00 AM [facility] PROVIDER 1 [facility]  12/19/2023  9:30 AM [facility] US3 [facility]  12/20/2023  9:35 AM Vannie Cornell SAUNDERS, CNM [facility]  12/26/2023 11:00 AM [facility] PROVIDER 1 [facility]  12/26/2023 11:30 AM [facility] US2 [facility]  01/02/2024 11:00 AM [facility] PROVIDER 1 [facility]  01/02/2024 11:30 AM [facility] US2 [facility]  01/03/2024  2:35 PM Vannie Cornell SAUNDERS, CNM [facility]  01/17/2024  9:35 AM Vannie Cornell SAUNDERS, CNM [facility]  01/24/2024 10:15 AM Vannie Cornell SAUNDERS, CNM [facility]  01/31/2024  9:35 AM Vannie Cornell SAUNDERS, CNM [facility]  02/07/2024  9:35 AM Vannie Cornell SAUNDERS, CNM [facility]  02/14/2024  9:35 AM Vannie Cornell SAUNDERS, CNM [facility]    Cornell SAUNDERS Vannie, CNM

## 2023-12-19 ENCOUNTER — Ambulatory Visit: Payer: MEDICAID | Attending: Obstetrics and Gynecology | Admitting: Maternal & Fetal Medicine

## 2023-12-19 ENCOUNTER — Ambulatory Visit (HOSPITAL_BASED_OUTPATIENT_CLINIC_OR_DEPARTMENT_OTHER): Payer: MEDICAID

## 2023-12-19 DIAGNOSIS — O09299 Supervision of pregnancy with other poor reproductive or obstetric history, unspecified trimester: Secondary | ICD-10-CM

## 2023-12-19 DIAGNOSIS — O99213 Obesity complicating pregnancy, third trimester: Secondary | ICD-10-CM

## 2023-12-19 DIAGNOSIS — O09293 Supervision of pregnancy with other poor reproductive or obstetric history, third trimester: Secondary | ICD-10-CM | POA: Diagnosis not present

## 2023-12-19 DIAGNOSIS — E669 Obesity, unspecified: Secondary | ICD-10-CM

## 2023-12-19 DIAGNOSIS — Z3A34 34 weeks gestation of pregnancy: Secondary | ICD-10-CM

## 2023-12-19 DIAGNOSIS — O0993 Supervision of high risk pregnancy, unspecified, third trimester: Secondary | ICD-10-CM

## 2023-12-19 NOTE — Progress Notes (Signed)
 After review, MFM consult with provider is not indicated for today  William Glenn, DO 12/19/2023 10:36 AM  [facility]

## 2023-12-20 ENCOUNTER — Ambulatory Visit: Payer: MEDICAID | Admitting: Certified Nurse Midwife

## 2023-12-20 ENCOUNTER — Other Ambulatory Visit: Payer: Self-pay

## 2023-12-20 VITALS — BP 111/68 | HR 91 | Wt 262.0 lb

## 2023-12-20 DIAGNOSIS — Z3A34 34 weeks gestation of pregnancy: Secondary | ICD-10-CM | POA: Diagnosis not present

## 2023-12-20 DIAGNOSIS — O0993 Supervision of high risk pregnancy, unspecified, third trimester: Secondary | ICD-10-CM | POA: Diagnosis not present

## 2023-12-20 NOTE — Progress Notes (Signed)
   PRENATAL VISIT NOTE  Subjective:  Lori Lam is a 28 y.o. [phone] at [redacted]w[redacted]d being seen today for ongoing prenatal care.  She is currently monitored for the following issues for this low-risk pregnancy and has Asthma; Depression; BMI 38.0-38.9,adult; Supervision of high-risk pregnancy; and History of fetal abnormality in previous pregnancy, currently pregnant on their problem list.  Patient reports no complaints.  Contractions: Not present. Vag. Bleeding: None.  Movement: Present. Denies leaking of fluid.   The following portions of the patient's history were reviewed and updated as appropriate: allergies, current medications, past family history, past medical history, past social history, past surgical history and problem list.   Objective:    Vitals:   12/20/23 0948  BP: 111/68  Pulse: 91  Weight: 262 lb (118.8 kg)    Fetal Status:  Fetal Heart Rate (bpm): 138   Movement: Present    General: Alert, oriented and cooperative. Patient is in no acute distress.  Skin: Skin is warm and dry. No rash noted.   Cardiovascular: Normal heart rate noted  Respiratory: Normal respiratory effort, no problems with respiration noted  Abdomen: Soft, gravid, appropriate for gestational age.  Pain/Pressure: Absent     Pelvic: Cervical exam deferred        Extremities: Normal range of motion.  Edema: None  Mental Status: Normal mood and affect. Normal behavior. Normal judgment and thought content.   Assessment and Plan:  Pregnancy: [id number] at [redacted]w[redacted]d 1. Supervision of high risk pregnancy in third trimester (Primary) - Doing well, feeling regular and vigorous fetal movement   2. [redacted] weeks gestation of pregnancy - Routine OB care including anticipatory guidance re GBS testing at next visit  Preterm labor symptoms and general obstetric precautions including but not limited to vaginal bleeding, contractions, leaking of fluid and fetal movement were reviewed in detail with the patient. Please  refer to After Visit Summary for other counseling recommendations.   Return in about 2 weeks (around 01/03/2024) for IN-PERSON, LOB/GBS.  Future Appointments  Date Time Provider Department Center  12/26/2023 11:00 AM [facility] PROVIDER 1 [facility]  12/26/2023 11:30 AM [facility] US2 [facility]  01/02/2024 11:00 AM [facility] PROVIDER 1 [facility]  01/02/2024 11:30 AM [facility] US2 [facility]  01/03/2024  2:35 PM Vannie Cornell SAUNDERS, CNM [facility]  01/10/2024  9:55 AM Vannie Cornell SAUNDERS, CNM [facility]  01/17/2024  9:35 AM Vannie Cornell SAUNDERS, CNM [facility]  01/24/2024 10:15 AM Vannie Cornell SAUNDERS, CNM [facility]  01/31/2024  9:35 AM Vannie Cornell SAUNDERS, CNM [facility]   Cornell SAUNDERS Vannie, CNM

## 2023-12-26 ENCOUNTER — Ambulatory Visit (HOSPITAL_BASED_OUTPATIENT_CLINIC_OR_DEPARTMENT_OTHER): Payer: MEDICAID

## 2023-12-26 ENCOUNTER — Other Ambulatory Visit: Payer: Self-pay | Admitting: *Deleted

## 2023-12-26 ENCOUNTER — Ambulatory Visit (HOSPITAL_BASED_OUTPATIENT_CLINIC_OR_DEPARTMENT_OTHER): Payer: MEDICAID | Attending: Obstetrics and Gynecology | Admitting: Maternal & Fetal Medicine

## 2023-12-26 DIAGNOSIS — O09293 Supervision of pregnancy with other poor reproductive or obstetric history, third trimester: Secondary | ICD-10-CM

## 2023-12-26 DIAGNOSIS — Z3A35 35 weeks gestation of pregnancy: Secondary | ICD-10-CM | POA: Insufficient documentation

## 2023-12-26 DIAGNOSIS — O99213 Obesity complicating pregnancy, third trimester: Secondary | ICD-10-CM | POA: Insufficient documentation

## 2023-12-26 DIAGNOSIS — O0993 Supervision of high risk pregnancy, unspecified, third trimester: Secondary | ICD-10-CM

## 2023-12-26 DIAGNOSIS — E669 Obesity, unspecified: Secondary | ICD-10-CM | POA: Diagnosis not present

## 2023-12-26 DIAGNOSIS — O09299 Supervision of pregnancy with other poor reproductive or obstetric history, unspecified trimester: Secondary | ICD-10-CM

## 2023-12-26 NOTE — Progress Notes (Signed)
 After review, MFM consult with provider is not indicated for today  William Glenn, DO 12/26/2023 11:52 AM  [facility]

## 2024-01-02 ENCOUNTER — Ambulatory Visit (HOSPITAL_BASED_OUTPATIENT_CLINIC_OR_DEPARTMENT_OTHER): Payer: MEDICAID

## 2024-01-02 ENCOUNTER — Ambulatory Visit: Payer: MEDICAID | Attending: Obstetrics and Gynecology | Admitting: Maternal & Fetal Medicine

## 2024-01-02 DIAGNOSIS — Z3A36 36 weeks gestation of pregnancy: Secondary | ICD-10-CM | POA: Diagnosis not present

## 2024-01-02 DIAGNOSIS — O0993 Supervision of high risk pregnancy, unspecified, third trimester: Secondary | ICD-10-CM | POA: Diagnosis not present

## 2024-01-02 DIAGNOSIS — O99213 Obesity complicating pregnancy, third trimester: Secondary | ICD-10-CM | POA: Diagnosis not present

## 2024-01-02 DIAGNOSIS — O09293 Supervision of pregnancy with other poor reproductive or obstetric history, third trimester: Secondary | ICD-10-CM | POA: Diagnosis not present

## 2024-01-02 DIAGNOSIS — E669 Obesity, unspecified: Secondary | ICD-10-CM

## 2024-01-02 NOTE — Progress Notes (Signed)
   Patient information  Patient Name: Lori Lam  Patient MRN:   [id number]  Referring practice: MFM Referring Provider: [facility])  Problem List   Patient Active Problem List   Diagnosis Date Noted   Hx of intrauterine growth restriction in prior pregnancy, currently pregnant, third trimester 09/05/2023   Supervision of high-risk pregnancy 07/04/2023   Obesity in pregnancy, antepartum, third trimester 08/16/2021   Asthma 12/28/2012   Depression 12/28/2012    Maternal Fetal medicine Consult  Lori Lam is a 27 y.o. H4E6986 at [redacted]w[redacted]d here for ultrasound and consultation. Lori Lam is doing well today with no acute concerns. Today we focused on the following:   BPP was 8/8 today due to elevated BMI. NST is next week and growth to follow in 2 weeks. She reports normal fetal movement and has no other concerns.   The patient had time to ask questions that were answered to her satisfaction.  She verbalized understanding and agrees to proceed with the plan below.  Sonographic findings Single intrauterine pregnancy at 36w 1d.  Fetal cardiac activity:  Observed and appears normal. Presentation: Cephalic. Interval fetal anatomy appears normal. Fetal biometry shows the estimated fetal weight at the  percentile. Amniotic fluid volume: Within normal limits. MVP: 3.05 cm. Placenta: Posterior.  There are limitations of prenatal ultrasound such as the inability to detect certain abnormalities due to poor visualization. Various factors such as fetal position, gestational age and maternal body habitus may increase the difficulty in visualizing the fetal anatomy.    Recommendations -Continue weekly testing and growth US  as scheduled.   Review of Systems: A review of systems was performed and was negative except per HPI   Vitals and Physical Exam    01/02/2024   11:12 AM 12/26/2023   10:57 AM 12/20/2023    9:48 AM  Vitals with BMI  Weight    262 lbs  Systolic 117 113 888  Diastolic 69 70 68  Pulse 82 87 91    Sitting comfortably on the sonogram table Nonlabored breathing Normal rate and rhythm Abdomen is nontender  Past pregnancies OB History  Gravida Para Term Preterm AB Living  5 3 3  0 1 3  SAB IAB Ectopic Multiple Live Births  0 1 0 0 3    # Outcome Date GA Lbr Len/2nd Weight Sex Type Anes PTL Lv  5 Current           4 Term 11/08/21 [redacted]w[redacted]d / 00:06 5 lb 6.8 oz (2.46 kg) F Vag-Spont EPI  LIV  3 Term 09/25/16 [redacted]w[redacted]d 17:05 / 00:17 7 lb 11.3 oz (3.495 kg) M Vag-Spont EPI  LIV  2 Term 12/27/12 [redacted]w[redacted]d 15:49 / 00:37 6 lb 12.4 oz (3.073 kg) M Vag-Spont EPI  LIV  1 IAB              I spent 10 minutes reviewing the patients chart, including labs and images as well as counseling the patient about her medical conditions. Greater than 50% of the time was spent in direct face-to-face patient counseling.  Delora Smaller  MFM, [facility]   01/02/2024  11:45 AM

## 2024-01-03 ENCOUNTER — Other Ambulatory Visit (HOSPITAL_COMMUNITY)
Admission: RE | Admit: 2024-01-03 | Discharge: 2024-01-03 | Disposition: A | Discharge status: Home / Self Care | Payer: MEDICAID | Source: Ambulatory Visit | Attending: Certified Nurse Midwife | Admitting: Certified Nurse Midwife

## 2024-01-03 ENCOUNTER — Ambulatory Visit (INDEPENDENT_AMBULATORY_CARE_PROVIDER_SITE_OTHER): Payer: MEDICAID | Admitting: Certified Nurse Midwife

## 2024-01-03 ENCOUNTER — Other Ambulatory Visit: Payer: Self-pay

## 2024-01-03 VITALS — BP 103/71 | HR 84 | Wt 264.7 lb

## 2024-01-03 DIAGNOSIS — Z3A36 36 weeks gestation of pregnancy: Secondary | ICD-10-CM | POA: Diagnosis not present

## 2024-01-03 DIAGNOSIS — O0993 Supervision of high risk pregnancy, unspecified, third trimester: Secondary | ICD-10-CM | POA: Diagnosis present

## 2024-01-04 LAB — CERVICOVAGINAL ANCILLARY ONLY
Chlamydia: NEGATIVE
Comment: NEGATIVE
Comment: NORMAL
Neisseria Gonorrhea: NEGATIVE

## 2024-01-06 LAB — CULTURE, BETA STREP (GROUP B ONLY): Strep Gp B Culture: NEGATIVE

## 2024-01-09 ENCOUNTER — Ambulatory Visit: Payer: MEDICAID | Attending: Obstetrics and Gynecology

## 2024-01-09 DIAGNOSIS — O99213 Obesity complicating pregnancy, third trimester: Secondary | ICD-10-CM | POA: Diagnosis present

## 2024-01-09 DIAGNOSIS — Z3A37 37 weeks gestation of pregnancy: Secondary | ICD-10-CM | POA: Diagnosis not present

## 2024-01-09 NOTE — Procedures (Signed)
 Lori Lam 06/25/1995 [redacted]w[redacted]d  Fetus A Non-Stress Test Interpretation for 01/09/24  Indication: Obesity - NST only  Fetal Heart Rate A Mode: External Baseline Rate (A): 125 bpm Variability: Moderate Accelerations: 15 x 15 Decelerations: None Multiple birth?: No  Uterine Activity Mode: Toco, Palpation Contraction Frequency (min): none noted Resting Tone Palpated: Relaxed  Interpretation (Fetal Testing) Nonstress Test Interpretation: Reactive Comments: Reviewed with Dr. Arna

## 2024-01-09 NOTE — Progress Notes (Signed)
   PRENATAL VISIT NOTE  Subjective:  Lori Lam is a 28 y.o. [phone] at [redacted]w[redacted]d being seen today for ongoing prenatal care.  She is currently monitored for the following issues for this low-risk pregnancy and has Asthma; Depression; Obesity in pregnancy, antepartum, third trimester; Supervision of high-risk pregnancy; and Hx of intrauterine growth restriction in prior pregnancy, currently pregnant, third trimester on their problem list.  Patient reports no complaints.  Contractions: Not present. Vag. Bleeding: None.  Movement: Present. Denies leaking of fluid.   The following portions of the patient's history were reviewed and updated as appropriate: allergies, current medications, past family history, past medical history, past social history, past surgical history and problem list.   Objective:   Vitals:   01/03/24 1451  BP: 103/71  Pulse: 84  Weight: 264 lb 11.2 oz (120.1 kg)    Fetal Status:  Fetal Heart Rate (bpm): 132 Fundal Height: 36 cm Movement: Present    General: Alert, oriented and cooperative. Patient is in no acute distress.  Skin: Skin is warm and dry. No rash noted.   Cardiovascular: Normal heart rate noted  Respiratory: Normal respiratory effort, no problems with respiration noted  Abdomen: Soft, gravid, appropriate for gestational age.  Pain/Pressure: Present     Pelvic: Cervical exam deferred        Extremities: Normal range of motion.  Edema: Trace  Mental Status: Normal mood and affect. Normal behavior. Normal judgment and thought content.   Assessment and Plan:  Pregnancy: [id number] at [redacted]w[redacted]d 1. Supervision of high risk pregnancy in third trimester (Primary) - Doing well, feeling regular and vigorous fetal movement   2. [redacted] weeks gestation of pregnancy - Routine OB care  - Culture, beta strep (group b only) - Cervicovaginal ancillary only( [place])  Preterm labor symptoms and general obstetric precautions including but not limited to vaginal  bleeding, contractions, leaking of fluid and fetal movement were reviewed in detail with the patient. Please refer to After Visit Summary for other counseling recommendations.   Return in about 1 week (around 01/10/2024) for IN-PERSON, LOB.  Future Appointments  Date Time Provider Department Center  01/10/2024  9:55 AM Lori Lam [facility]  01/16/2024  3:30 PM [facility] PROVIDER 1 [facility]  01/16/2024  3:45 PM [facility] US4 [facility]  01/17/2024  9:35 AM Lori Lam, Lori Lam [facility]  01/24/2024 10:15 AM Lori Lam, Lori Lam [facility]  01/31/2024  9:35 AM Lori Lam, Lori Lam [facility]    Lori Lam, Lori Lam

## 2024-01-10 ENCOUNTER — Ambulatory Visit (INDEPENDENT_AMBULATORY_CARE_PROVIDER_SITE_OTHER): Payer: MEDICAID | Admitting: Certified Nurse Midwife

## 2024-01-10 ENCOUNTER — Other Ambulatory Visit: Payer: Self-pay

## 2024-01-10 VITALS — BP 114/66 | HR 95 | Wt 266.0 lb

## 2024-01-10 DIAGNOSIS — O26893 Other specified pregnancy related conditions, third trimester: Secondary | ICD-10-CM | POA: Diagnosis not present

## 2024-01-10 DIAGNOSIS — Z3A37 37 weeks gestation of pregnancy: Secondary | ICD-10-CM | POA: Diagnosis not present

## 2024-01-10 DIAGNOSIS — O0993 Supervision of high risk pregnancy, unspecified, third trimester: Secondary | ICD-10-CM

## 2024-01-10 DIAGNOSIS — R102 Pelvic and perineal pain: Secondary | ICD-10-CM

## 2024-01-10 MED ORDER — CYCLOBENZAPRINE HCL 10 MG PO TABS: 10.0000 mg | ORAL_TABLET | Freq: Every day | ORAL | 1 refills | Status: DC

## 2024-01-10 NOTE — Progress Notes (Signed)
   PRENATAL VISIT NOTE  Subjective:  Lori Lam is a 28 y.o. [phone] at [redacted]w[redacted]d being seen today for ongoing prenatal care.  She is currently monitored for the following issues for this high-risk pregnancy and has Asthma; Depression; Obesity in pregnancy, antepartum, third trimester; Supervision of high-risk pregnancy; and Hx of intrauterine growth restriction in prior pregnancy, currently pregnant, third trimester on their problem list.  Patient reports pelvic pain and pressure, low back and sciatic pain, otherwise well. Having occasional contractions.  Contractions: Irritability. Vag. Bleeding: None.  Movement: Present. Denies leaking of fluid.   The following portions of the patient's history were reviewed and updated as appropriate: allergies, current medications, past family history, past medical history, past social history, past surgical history and problem list.   Objective:   Vitals:   01/10/24 1020  BP: 114/66  Pulse: 95  Weight: 266 lb (120.7 kg)   Fetal Status:  Fetal Heart Rate (bpm): 150 Fundal Height: 37 cm Movement: Present    General: Alert, oriented and cooperative. Patient is in no acute distress.  Skin: Skin is warm and dry. No rash noted.   Cardiovascular: Normal heart rate noted  Respiratory: Normal respiratory effort, no problems with respiration noted  Abdomen: Soft, gravid, appropriate for gestational age.  Pain/Pressure: Present     Pelvic: Cervical exam deferred        Extremities: Normal range of motion.  Edema: Trace  Mental Status: Normal mood and affect. Normal behavior. Normal judgment and thought content.   Assessment and Plan:  Pregnancy: [id number] at [redacted]w[redacted]d 1. Supervision of high risk pregnancy in third trimester (Primary) - Doing well, feeling regular and vigorous fetal movement  2. [redacted] weeks gestation of pregnancy - Routine OB care   3. Pelvic pain affecting pregnancy in third trimester, antepartum - Gave info for chiro, suggested warm  baths and turmeric milk at bedtime - cyclobenzaprine  (FLEXERIL ) 10 MG tablet; Take 1 tablet (10 mg total) by mouth at bedtime.  Dispense: 30 tablet; Refill: 1  Term labor symptoms and general obstetric precautions including but not limited to vaginal bleeding, contractions, leaking of fluid and fetal movement were reviewed in detail with the patient. Please refer to After Visit Summary for other counseling recommendations.   Return in about 1 week (around 01/17/2024) for IN-PERSON, LOB.  Future Appointments  Date Time Provider Department Center  01/16/2024  3:30 PM [facility] PROVIDER 1 [facility]  01/16/2024  3:45 PM [facility] US4 [facility]  01/17/2024  9:35 AM Vannie Cornell SAUNDERS, CNM [facility]  01/24/2024 10:15 AM Vannie Cornell SAUNDERS, CNM [facility]  01/31/2024  9:35 AM Vannie Cornell SAUNDERS, CNM [facility]   Cornell SAUNDERS Vannie, CNM

## 2024-01-10 NOTE — Patient Instructions (Signed)
  Lori Lam w/ [facility]   (At [facility]) [address] [place] [phone] [email or web address]

## 2024-01-16 ENCOUNTER — Ambulatory Visit (HOSPITAL_BASED_OUTPATIENT_CLINIC_OR_DEPARTMENT_OTHER): Payer: MEDICAID | Admitting: Obstetrics and Gynecology

## 2024-01-16 ENCOUNTER — Ambulatory Visit: Payer: MEDICAID

## 2024-01-16 ENCOUNTER — Ambulatory Visit: Payer: MEDICAID | Attending: Obstetrics and Gynecology

## 2024-01-16 VITALS — BP 119/77

## 2024-01-16 DIAGNOSIS — Z3A38 38 weeks gestation of pregnancy: Secondary | ICD-10-CM | POA: Diagnosis not present

## 2024-01-16 DIAGNOSIS — O99213 Obesity complicating pregnancy, third trimester: Secondary | ICD-10-CM | POA: Diagnosis not present

## 2024-01-16 DIAGNOSIS — O0993 Supervision of high risk pregnancy, unspecified, third trimester: Secondary | ICD-10-CM | POA: Diagnosis present

## 2024-01-16 DIAGNOSIS — O09293 Supervision of pregnancy with other poor reproductive or obstetric history, third trimester: Secondary | ICD-10-CM

## 2024-01-16 DIAGNOSIS — E669 Obesity, unspecified: Secondary | ICD-10-CM | POA: Diagnosis not present

## 2024-01-16 NOTE — Progress Notes (Signed)
 After review, MFM consult with provider is not indicated for today  Arna Ranks, MD 01/16/2024 5:59 PM  [facility]

## 2024-01-17 ENCOUNTER — Ambulatory Visit: Payer: MEDICAID | Admitting: Certified Nurse Midwife

## 2024-01-17 ENCOUNTER — Other Ambulatory Visit: Payer: Self-pay

## 2024-01-17 VITALS — BP 137/84 | HR 94 | Wt 264.1 lb

## 2024-01-17 DIAGNOSIS — Z3A38 38 weeks gestation of pregnancy: Secondary | ICD-10-CM

## 2024-01-17 DIAGNOSIS — O0993 Supervision of high risk pregnancy, unspecified, third trimester: Secondary | ICD-10-CM | POA: Diagnosis not present

## 2024-01-17 NOTE — Progress Notes (Deleted)
   PRENATAL VISIT NOTE  Subjective:  Lori Lam is a 28 y.o. [phone] at [redacted]w[redacted]d being seen today for ongoing prenatal care.  She is currently monitored for the following issues for this high-risk pregnancy and has Asthma; Depression; Obesity in pregnancy, antepartum, third trimester; Supervision of high-risk pregnancy; and Hx of intrauterine growth restriction in prior pregnancy, currently pregnant, third trimester on their problem list.  Patient reports {sx:14538}.  Contractions: Irritability. Vag. Bleeding: None.  Movement: Present. Denies leaking of fluid.   The following portions of the patient's history were reviewed and updated as appropriate: allergies, current medications, past family history, past medical history, past social history, past surgical history and problem list.   Objective:    Vitals:   01/17/24 0955  BP: 137/84  Pulse: 94  Weight: 264 lb 1.6 oz (119.8 kg)    Fetal Status:  Fetal Heart Rate (bpm): 126 Fundal Height: 38 cm Movement: Present    General: Alert, oriented and cooperative. Patient is in no acute distress.  Skin: Skin is warm and dry. No rash noted.   Cardiovascular: Normal heart rate noted  Respiratory: Normal respiratory effort, no problems with respiration noted  Abdomen: Soft, gravid, appropriate for gestational age.  Pain/Pressure: Present     Pelvic: {Blank single:19197::Cervical exam performed in the presence of a chaperone,Cervical exam deferred}        Extremities: Normal range of motion.  Edema: Trace  Mental Status: Normal mood and affect. Normal behavior. Normal judgment and thought content.   Assessment and Plan:  Pregnancy: [id number] at [redacted]w[redacted]d There are no diagnoses linked to this encounter. Term labor symptoms and general obstetric precautions including but not limited to vaginal bleeding, contractions, leaking of fluid and fetal movement were reviewed in detail with the patient. Please refer to After Visit Summary for other  counseling recommendations.   No follow-ups on file.  Future Appointments  Date Time Provider Department Center  01/24/2024 10:15 AM Vannie Cornell JONELLE EDDY [facility]  01/26/2024  3:00 PM [facility] NURSE [facility]  01/26/2024  3:15 PM [facility] NST [facility]  01/31/2024  9:35 AM Vannie Cornell JONELLE, CNM [facility]    Tommy Daring, RN

## 2024-01-17 NOTE — Progress Notes (Signed)
   PRENATAL VISIT NOTE  Subjective:  Lori Lam is a 28 y.o. [phone] at [redacted]w[redacted]d being seen today for ongoing prenatal care.  She is currently monitored for the following issues for this high-risk pregnancy and has Asthma; Depression; Obesity in pregnancy, antepartum, third trimester; Supervision of high-risk pregnancy; and Hx of intrauterine growth restriction in prior pregnancy, currently pregnant, third trimester on their problem list.  Patient reports occasional contractions.  Contractions: Irritability. Vag. Bleeding: None.  Movement: Present. Denies leaking of fluid.   The following portions of the patient's history were reviewed and updated as appropriate: allergies, current medications, past family history, past medical history, past social history, past surgical history and problem list.   Objective:    Vitals:   01/17/24 0955  BP: 137/84  Pulse: 94  Weight: 264 lb 1.6 oz (119.8 kg)    Fetal Status:  Fetal Heart Rate (bpm): 126 Fundal Height: 38 cm Movement: Present Presentation: Vertex  General: Alert, oriented and cooperative. Patient is in no acute distress.  Skin: Skin is warm and dry. No rash noted.   Cardiovascular: Normal heart rate noted  Respiratory: Normal respiratory effort, no problems with respiration noted  Abdomen: Soft, gravid, appropriate for gestational age.  Pain/Pressure: Present     Pelvic: Cervical exam performed in the presence of a chaperone Dilation: 1.5 Effacement (%): 50    Extremities: Normal range of motion.  Edema: Trace  Mental Status: Normal mood and affect. Normal behavior. Normal judgment and thought content.   Assessment and Plan:  Pregnancy: [id number] at [redacted]w[redacted]d 1. Supervision of high risk pregnancy in third trimester (Primary) -Routine OB care   2. [redacted] weeks gestation of pregnancy - Doing well, feeling regular and vigorous fetal movement  - Membrane sweep performed today.   Term labor symptoms and general obstetric precautions  including but not limited to vaginal bleeding, contractions, leaking of fluid and fetal movement were reviewed in detail with the patient. Please refer to After Visit Summary for other counseling recommendations.    Future Appointments  Date Time Provider Department Center  01/24/2024 10:15 AM Vannie Cornell JONELLE EDDY [facility]  01/26/2024  3:00 PM [facility] NURSE [facility]  01/26/2024  3:15 PM [facility] NST [facility]  01/31/2024  9:35 AM Vannie Cornell JONELLE, CNM [facility]    Tommy Daring, NP-S

## 2024-01-19 ENCOUNTER — Other Ambulatory Visit: Payer: Self-pay

## 2024-01-19 ENCOUNTER — Encounter (HOSPITAL_COMMUNITY): Payer: Self-pay | Admitting: Obstetrics & Gynecology

## 2024-01-19 ENCOUNTER — Inpatient Hospital Stay (HOSPITAL_COMMUNITY)
Admission: AD | Admit: 2024-01-19 | Discharge: 2024-01-19 | Disposition: A | Discharge status: Home / Self Care | Payer: MEDICAID | Attending: Obstetrics & Gynecology | Admitting: Obstetrics & Gynecology

## 2024-01-19 ENCOUNTER — Other Ambulatory Visit: Payer: Self-pay | Admitting: Certified Nurse Midwife

## 2024-01-19 DIAGNOSIS — O99513 Diseases of the respiratory system complicating pregnancy, third trimester: Secondary | ICD-10-CM | POA: Diagnosis not present

## 2024-01-19 DIAGNOSIS — O0993 Supervision of high risk pregnancy, unspecified, third trimester: Secondary | ICD-10-CM

## 2024-01-19 DIAGNOSIS — J45909 Unspecified asthma, uncomplicated: Secondary | ICD-10-CM | POA: Diagnosis not present

## 2024-01-19 DIAGNOSIS — Z3689 Encounter for other specified antenatal screening: Secondary | ICD-10-CM

## 2024-01-19 DIAGNOSIS — Z3493 Encounter for supervision of normal pregnancy, unspecified, third trimester: Secondary | ICD-10-CM

## 2024-01-19 DIAGNOSIS — O09293 Supervision of pregnancy with other poor reproductive or obstetric history, third trimester: Secondary | ICD-10-CM | POA: Diagnosis not present

## 2024-01-19 DIAGNOSIS — Z3A38 38 weeks gestation of pregnancy: Secondary | ICD-10-CM

## 2024-01-19 DIAGNOSIS — O471 False labor at or after 37 completed weeks of gestation: Secondary | ICD-10-CM

## 2024-01-19 DIAGNOSIS — Z0371 Encounter for suspected problem with amniotic cavity and membrane ruled out: Secondary | ICD-10-CM | POA: Diagnosis present

## 2024-01-19 LAB — RUPTURE OF MEMBRANE (ROM)PLUS: Rom Plus: NEGATIVE

## 2024-01-19 LAB — POCT FERN TEST: POCT Fern Test: NEGATIVE

## 2024-01-19 NOTE — Progress Notes (Signed)
 Orders for IOL.  Cornell Finder, CNM, MSN, IBCLC Certified Nurse Midwife, [facility]

## 2024-01-19 NOTE — MAU Provider Note (Addendum)
 Chief Complaint:  Contractions and Rupture of Membranes  HPI  Lori Lam is a 28 y.o. H4E6986 at [redacted]w[redacted]d who presents to maternity admissions reporting 1 episode of leakage of fluid. Took evening primrose oil vaginally last night and had intercourse. She denies any vaginal bleeding or decreased fetal movement, but is having increased pelvic pressure.  Pregnancy Course: She receives her prenatal [facility] at [facility].  Pregnancy is complicated by asthma and history of IUGR.  Past Medical History:  Diagnosis Date   Anemia    Anxiety    Asthma    Bacterial vaginitis    Chlamydia contact, treated    GERD (gastroesophageal reflux disease)    H/O chlamydia infection 12/28/2012   Treated again 08/2015 Negative 08/30/16   Headache    Mild concussion 12/21/2017   OB History  Gravida Para Term Preterm AB Living  5 3 3  0 1 3  SAB IAB Ectopic Multiple Live Births  0 1 0 0 3    # Outcome Date GA Lbr Len/2nd Weight Sex Type Anes PTL Lv  5 Current           4 Term 11/08/21 [redacted]w[redacted]d / 00:06 5 lb 6.8 oz (2.46 kg) F Vag-Spont EPI  LIV  3 Term 09/25/16 [redacted]w[redacted]d 17:05 / 00:17 7 lb 11.3 oz (3.495 kg) M Vag-Spont EPI  LIV  2 Term 12/27/12 [redacted]w[redacted]d 15:49 / 00:37 6 lb 12.4 oz (3.073 kg) M Vag-Spont EPI  LIV  1 IAB            Past Surgical History:  Procedure Laterality Date   HERNIA REPAIR     INSERTION OF IMPLANON  ROD  12/08/2021   Family History  Problem Relation Age of Onset   Hypertension Mother    Hypertension Father    Hypertension Maternal Grandmother    Hypertension Maternal Grandfather    Hypertension Paternal Grandmother    Hypertension Other    Diabetes Other    Social History   Tobacco Use   Smoking status: Former    Current packs/day: 0.00    Types: Cigarettes    Quit date: 03/2021    Years since quitting: 2.8   Smokeless tobacco: Never   Tobacco comments:    2 cigs/day  Vaping Use   Vaping status: Never Used  Substance Use Topics   Alcohol use: Not Currently    Comment: socially,  not since confirmed pregnancy   Drug use: No   No Known Allergies No medications prior to admission.   I have reviewed patient's Past Medical Hx, Surgical Hx, Family Hx, Social Hx, medications and allergies.   ROS  Pertinent items noted in HPI and remainder of comprehensive ROS otherwise negative.   PHYSICAL EXAM  Patient Vitals for the past 24 hrs:  BP Temp Temp src Pulse Resp SpO2 Height Weight  01/19/24 1459 119/66 -- -- 97 -- -- -- --  01/19/24 1244 119/74 98.6 F (37 C) Oral 99 18 100 % -- --  01/19/24 1240 -- -- -- -- -- -- 5' 6 (1.676 m) 262 lb 6.4 oz (119 kg)   Constitutional: Well-developed, well-nourished female in no acute distress.  Cardiovascular: normal rate & rhythm, warm and well-perfused Respiratory: normal effort, no problems with respiration noted GI: Abd soft, non-tender, non-distended MS: Extremities nontender, no edema, normal ROM Neurologic: Alert and oriented x 4.  GU: no CVA tenderness Pelvic: normal external female genitalia, physiologic discharge, no blood, cervix clean.   Dilation: 2 Effacement (%): 50 Cervical Position:  Posterior Presentation: Vertex Exam by:: Dr. Jomarie  Fetal Tracing: reactive Baseline: 125 Variability:moderate Accelerations: 15x15 Decelerations: none Toco: irregular, UI   Labs: Results for orders placed or performed during the hospital encounter of 01/19/24 (from the past 24 hours)  POCT fern test     Status: Normal   Collection Time: 01/19/24  1:55 PM  Result Value Ref Range   POCT Fern Test Negative = intact amniotic membranes   Rupture of Membrane (ROM) Plus     Status: None   Collection Time: 01/19/24  1:57 PM  Result Value Ref Range   Rom Plus NEGATIVE    Imaging:  No results found.  MDM & MAU COURSE  MDM: Moderate  MAU Course: Orders Placed This Encounter  Procedures   Rupture of Membrane (ROM) Plus   Contraction - monitoring   External fetal heart monitoring   Vaginal exam   POCT fern test    Discharge patient   No orders of the defined types were placed in this encounter.  Initial fern test negative.  Sterile speculum exam completed, cervix visibly closed, negative pool.  ROM plus and repeat fern negative.  Patient requested cervical check, 2/50/high.  Care turned over to [facility], CNM.  Charlie Jomarie, MD   Reassured pt that fluid likely cervical mucus/semen due to evening activities. Pt requested membrane sweep, completed without issue. Stable for discharge home.  ASSESSMENT   1. Intact amniotic membranes during pregnancy in third trimester   2. Supervision of high risk pregnancy in third trimester   3. [redacted] weeks gestation of pregnancy   4. NST (non-stress test) reactive    PLAN  Discharge home in stable condition with return precautions.     Follow-up Information     [facility] for [facility] Follow up on 01/24/2024.   Specialty: Obstetrics and Gynecology Why: as scheduled for ongoing prenatal care Contact information: [address] [place]  [address] [phone]                Allergies as of 01/19/2024   No Known Allergies      Medication List     TAKE these medications    Adult Blood Pressure Cuff Lg Kit 1 Device by Does not apply route once a week.   albuterol  108 (90 Base) MCG/ACT inhaler Commonly known as: VENTOLIN  HFA Inhale 2 puffs into the lungs every 6 (six) hours as needed for wheezing or shortness of breath.   aspirin  EC 81 MG tablet Take 1 tablet (81 mg total) by mouth daily. Swallow whole.   cyclobenzaprine  10 MG tablet Commonly known as: FLEXERIL  Take 1 tablet (10 mg total) by mouth at bedtime.   ferrous sulfate  325 (65 FE) MG EC tablet Take 1 tablet (325 mg total) by mouth every other day.   fluticasone  50 MCG/ACT nasal spray Commonly known as: FLONASE  Place 2 sprays into both nostrils daily.   loratadine  10 MG tablet Commonly known as: CLARITIN  Take 1  tablet (10 mg total) by mouth daily.   multivitamin-prenatal 27-0.8 MG Tabs tablet Take 1 tablet by mouth daily at 12 noon.   ondansetron  4 MG disintegrating tablet Commonly known as: ZOFRAN -ODT Take 1 tablet (4 mg total) by mouth every 6 (six) hours as needed for nausea.       Cornell Finder, CNM, MSN, IBCLC Certified Nurse Midwife, [facility]

## 2024-01-19 NOTE — MAU Note (Signed)
 Lori Lam is a 29 y.o. at [redacted]w[redacted]d here in MAU reporting: she's been having ctxs since last night, states ctxs are approximately 6 minutes apart.  Also reports began leaking clear fluid this morning @ 0600.  Denies VB.  Endorses +FM.  LMP: 04/24/2023 Onset of complaint: today Pain score: 6 Vitals:   01/19/24 1244  BP: 119/74  Pulse: 99  Resp: 18  Temp: 98.6 F (37 C)  SpO2: 100%     FHT: 156 bpm  Lab orders placed from triage: None

## 2024-01-22 ENCOUNTER — Encounter (HOSPITAL_COMMUNITY): Payer: Self-pay

## 2024-01-22 ENCOUNTER — Telehealth (HOSPITAL_COMMUNITY): Payer: Self-pay | Admitting: *Deleted

## 2024-01-22 NOTE — Telephone Encounter (Signed)
 Preadmission screen

## 2024-01-23 ENCOUNTER — Telehealth (HOSPITAL_COMMUNITY): Payer: Self-pay | Admitting: *Deleted

## 2024-01-23 ENCOUNTER — Encounter (HOSPITAL_COMMUNITY): Payer: Self-pay | Admitting: *Deleted

## 2024-01-23 NOTE — Telephone Encounter (Signed)
 Preadmission screen

## 2024-01-24 ENCOUNTER — Other Ambulatory Visit: Payer: Self-pay

## 2024-01-24 ENCOUNTER — Ambulatory Visit: Payer: MEDICAID | Admitting: Certified Nurse Midwife

## 2024-01-24 VITALS — BP 120/77 | HR 89 | Wt 264.6 lb

## 2024-01-24 DIAGNOSIS — O0993 Supervision of high risk pregnancy, unspecified, third trimester: Secondary | ICD-10-CM

## 2024-01-24 DIAGNOSIS — Z3A39 39 weeks gestation of pregnancy: Secondary | ICD-10-CM

## 2024-01-24 NOTE — Progress Notes (Signed)
   PRENATAL VISIT NOTE  Subjective:  Lori Lam is a 28 y.o. [phone] at [redacted]w[redacted]d being seen today for ongoing prenatal care.  She is currently monitored for the following issues for this high-risk pregnancy and has Asthma; Depression; Obesity in pregnancy, antepartum, third trimester; Supervision of high-risk pregnancy; and Hx of intrauterine growth restriction in prior pregnancy, currently pregnant, third trimester on their problem list.  Patient reports no complaints.  Contractions: Irritability. Vag. Bleeding: None.  Movement: Present. Denies leaking of fluid.   The following portions of the patient's history were reviewed and updated as appropriate: allergies, current medications, past family history, past medical history, past social history, past surgical history and problem list.   Objective:    Vitals:   01/24/24 1048  BP: 120/77  Pulse: 89  Weight: 264 lb 9.6 oz (120 kg)    Fetal Status:  Fetal Heart Rate (bpm): 136 Fundal Height: 39 cm Movement: Present Presentation: Vertex  General: Alert, oriented and cooperative. Patient is in no acute distress.  Skin: Skin is warm and dry. No rash noted.   Cardiovascular: Normal heart rate noted  Respiratory: Normal respiratory effort, no problems with respiration noted  Abdomen: Soft, gravid, appropriate for gestational age.  Pain/Pressure: Absent     Pelvic: Cervical exam deferred        Extremities: Normal range of motion.  Edema: None  Mental Status: Normal mood and affect. Normal behavior. Normal judgment and thought content.   Assessment and Plan:  Pregnancy: [id number] at [redacted]w[redacted]d 1. Supervision of high risk pregnancy in third trimester (Primary) - Doing well, feeling regular and vigorous fetal movement   2. [redacted] weeks gestation of pregnancy - Routine OB care  - Has induction scheduled for tomorrow, orders are in.  Term labor symptoms and general obstetric precautions including but not limited to vaginal bleeding,  contractions, leaking of fluid and fetal movement were reviewed in detail with the patient. Please refer to After Visit Summary for other counseling recommendations.   No follow-ups on file.  Future Appointments  Date Time Provider Department Center  01/25/2024  7:00 AM [facility] SCHED ROOM [facility] None  03/13/2024  8:35 AM Vannie Cornell SAUNDERS, CNM [facility]   Cornell SAUNDERS Vannie, CNM

## 2024-01-25 ENCOUNTER — Encounter (HOSPITAL_COMMUNITY): Payer: Self-pay | Admitting: Family Medicine

## 2024-01-25 ENCOUNTER — Inpatient Hospital Stay (HOSPITAL_COMMUNITY): Payer: MEDICAID | Admitting: Anesthesiology

## 2024-01-25 ENCOUNTER — Inpatient Hospital Stay (HOSPITAL_COMMUNITY): Payer: MEDICAID

## 2024-01-25 ENCOUNTER — Inpatient Hospital Stay (HOSPITAL_COMMUNITY)
Admission: RE | Admit: 2024-01-25 | Discharge: 2024-01-27 | DRG: 807 | Disposition: A | Discharge status: Home / Self Care | Payer: MEDICAID | Attending: Family Medicine | Admitting: Family Medicine

## 2024-01-25 ENCOUNTER — Other Ambulatory Visit: Payer: Self-pay

## 2024-01-25 DIAGNOSIS — O99214 Obesity complicating childbirth: Secondary | ICD-10-CM

## 2024-01-25 DIAGNOSIS — Z8249 Family history of ischemic heart disease and other diseases of the circulatory system: Secondary | ICD-10-CM

## 2024-01-25 DIAGNOSIS — Z87891 Personal history of nicotine dependence: Secondary | ICD-10-CM

## 2024-01-25 DIAGNOSIS — O9962 Diseases of the digestive system complicating childbirth: Secondary | ICD-10-CM | POA: Diagnosis present

## 2024-01-25 DIAGNOSIS — Z7982 Long term (current) use of aspirin: Secondary | ICD-10-CM

## 2024-01-25 DIAGNOSIS — Z833 Family history of diabetes mellitus: Secondary | ICD-10-CM

## 2024-01-25 DIAGNOSIS — K219 Gastro-esophageal reflux disease without esophagitis: Secondary | ICD-10-CM | POA: Diagnosis present

## 2024-01-25 DIAGNOSIS — O9902 Anemia complicating childbirth: Secondary | ICD-10-CM | POA: Diagnosis present

## 2024-01-25 DIAGNOSIS — Z3A39 39 weeks gestation of pregnancy: Secondary | ICD-10-CM

## 2024-01-25 DIAGNOSIS — O99344 Other mental disorders complicating childbirth: Secondary | ICD-10-CM

## 2024-01-25 LAB — CBC
HCT: 36 % (ref 36.0–46.0)
HCT: 34.2 % — ABNORMAL LOW (ref 36.0–46.0)
Hemoglobin: 11.7 g/dL — ABNORMAL LOW (ref 12.0–15.0)
Hemoglobin: 11.2 g/dL — ABNORMAL LOW (ref 12.0–15.0)
MCH: 28.5 pg (ref 26.0–34.0)
MCH: 28.6 pg (ref 26.0–34.0)
MCHC: 32.5 g/dL (ref 30.0–36.0)
MCHC: 32.7 g/dL (ref 30.0–36.0)
MCV: 87.6 fL (ref 80.0–100.0)
MCV: 87.5 fL (ref 80.0–100.0)
Platelets: 110 K/uL — ABNORMAL LOW (ref 150–400)
Platelets: 250 K/uL (ref 150–400)
RBC: 4.11 MIL/uL (ref 3.87–5.11)
RBC: 3.91 MIL/uL (ref 3.87–5.11)
RDW: 14 % (ref 11.5–15.5)
RDW: 13.8 % (ref 11.5–15.5)
WBC: 9 K/uL (ref 4.0–10.5)
WBC: 13.3 K/uL — ABNORMAL HIGH (ref 4.0–10.5)
nRBC: 0 % (ref 0.0–0.2)
nRBC: 0 % (ref 0.0–0.2)

## 2024-01-25 LAB — TYPE AND SCREEN
ABO/RH(D): B POS
Antibody Screen: NEGATIVE

## 2024-01-25 LAB — RPR: RPR Ser Ql: NONREACTIVE

## 2024-01-25 MED ORDER — ACETAMINOPHEN 325 MG PO TABS
650.0000 mg | ORAL_TABLET | ORAL | Status: DC | PRN
  Administered 2024-01-25 – 2024-01-27 (×3): 650 mg via ORAL
  Filled 2024-01-25 (×3): qty 2

## 2024-01-25 MED ORDER — EPHEDRINE 5 MG/ML INJ: 10.0000 mg | INTRAVENOUS | Status: DC | PRN

## 2024-01-25 MED ORDER — FLEET ENEMA RE ENEM
1.0000 | ENEMA | RECTAL | Status: DC | PRN
Start: 2024-01-25 — End: 2024-01-25

## 2024-01-25 MED ORDER — ONDANSETRON HCL 4 MG/2ML IJ SOLN: 4.0000 mg | INTRAMUSCULAR | Status: DC | PRN

## 2024-01-25 MED ORDER — DIPHENHYDRAMINE HCL 50 MG/ML IJ SOLN: 12.5000 mg | INTRAMUSCULAR | Status: DC | PRN

## 2024-01-25 MED ORDER — LACTATED RINGERS IV SOLN: INTRAVENOUS | Status: DC

## 2024-01-25 MED ORDER — TERBUTALINE SULFATE 1 MG/ML IJ SOLN: 0.2500 mg | Freq: Once | INTRAMUSCULAR | Status: DC | PRN

## 2024-01-25 MED ORDER — OXYTOCIN-SODIUM CHLORIDE 30-0.9 UT/500ML-% IV SOLN: 2.5000 [IU]/h | INTRAVENOUS | Status: DC

## 2024-01-25 MED ORDER — OXYCODONE-ACETAMINOPHEN 5-325 MG PO TABS: 2.0000 | ORAL_TABLET | ORAL | Status: DC | PRN

## 2024-01-25 MED ORDER — FENTANYL CITRATE (PF) 100 MCG/2ML IJ SOLN
50.0000 ug | INTRAMUSCULAR | Status: DC | PRN
  Administered 2024-01-25: 100 ug via INTRAVENOUS
  Administered 2024-01-25 (×2): 50 ug via INTRAVENOUS
  Filled 2024-01-25 (×3): qty 2

## 2024-01-25 MED ORDER — SENNOSIDES-DOCUSATE SODIUM 8.6-50 MG PO TABS
2.0000 | ORAL_TABLET | ORAL | Status: DC
  Administered 2024-01-26 – 2024-01-27 (×2): 2 via ORAL
  Filled 2024-01-25 (×2): qty 2

## 2024-01-25 MED ORDER — LIDOCAINE HCL (PF) 1 % IJ SOLN
INTRAMUSCULAR | Status: DC | PRN
  Administered 2024-01-25: 11 mL via EPIDURAL

## 2024-01-25 MED ORDER — ONDANSETRON HCL 4 MG PO TABS: 4.0000 mg | ORAL_TABLET | ORAL | Status: DC | PRN

## 2024-01-25 MED ORDER — SIMETHICONE 80 MG PO CHEW: 80.0000 mg | CHEWABLE_TABLET | ORAL | Status: DC | PRN

## 2024-01-25 MED ORDER — PRENATAL MULTIVITAMIN CH
1.0000 | ORAL_TABLET | Freq: Every day | ORAL | Status: DC
  Administered 2024-01-26 – 2024-01-27 (×2): 1 via ORAL
  Filled 2024-01-25 (×2): qty 1

## 2024-01-25 MED ORDER — FENTANYL-BUPIVACAINE-NACL 0.5-0.125-0.9 MG/250ML-% EP SOLN
12.0000 mL/h | EPIDURAL | Status: DC | PRN
  Administered 2024-01-25: 12 mL/h via EPIDURAL
  Filled 2024-01-25: qty 250

## 2024-01-25 MED ORDER — LACTATED RINGERS IV SOLN: 500.0000 mL | Freq: Once | INTRAVENOUS | Status: DC

## 2024-01-25 MED ORDER — PHENYLEPHRINE 80 MCG/ML (10ML) SYRINGE FOR IV PUSH (FOR BLOOD PRESSURE SUPPORT)
80.0000 ug | PREFILLED_SYRINGE | INTRAVENOUS | Status: DC | PRN
  Administered 2024-01-25: 160 ug via INTRAVENOUS

## 2024-01-25 MED ORDER — COCONUT OIL OIL: 1.0000 | TOPICAL_OIL | Status: DC | PRN

## 2024-01-25 MED ORDER — SOD CITRATE-CITRIC ACID 500-334 MG/5ML PO SOLN: 30.0000 mL | ORAL | Status: DC | PRN

## 2024-01-25 MED ORDER — OXYTOCIN BOLUS FROM INFUSION
333.0000 mL | Freq: Once | INTRAVENOUS | Status: AC
  Administered 2024-01-25: 333 mL via INTRAVENOUS

## 2024-01-25 MED ORDER — BENZOCAINE-MENTHOL 20-0.5 % EX AERO: 1.0000 | INHALATION_SPRAY | CUTANEOUS | Status: DC | PRN

## 2024-01-25 MED ORDER — EPHEDRINE 5 MG/ML INJ
10.0000 mg | INTRAVENOUS | Status: DC | PRN
  Filled 2024-01-25: qty 5

## 2024-01-25 MED ORDER — OXYTOCIN-SODIUM CHLORIDE 30-0.9 UT/500ML-% IV SOLN
1.0000 m[IU]/min | INTRAVENOUS | Status: DC
  Administered 2024-01-25: 2 m[IU]/min via INTRAVENOUS
  Filled 2024-01-25: qty 500

## 2024-01-25 MED ORDER — PHENYLEPHRINE 80 MCG/ML (10ML) SYRINGE FOR IV PUSH (FOR BLOOD PRESSURE SUPPORT)
80.0000 ug | PREFILLED_SYRINGE | INTRAVENOUS | Status: DC | PRN
  Administered 2024-01-25: 80 ug via INTRAVENOUS
  Filled 2024-01-25: qty 10

## 2024-01-25 MED ORDER — ZOLPIDEM TARTRATE 5 MG PO TABS: 5.0000 mg | ORAL_TABLET | Freq: Every evening | ORAL | Status: DC | PRN

## 2024-01-25 MED ORDER — LACTATED RINGERS IV SOLN: 500.0000 mL | INTRAVENOUS | Status: DC | PRN

## 2024-01-25 MED ORDER — TETANUS-DIPHTH-ACELL PERTUSSIS 5-2.5-18.5 LF-MCG/0.5 IM SUSY: 0.5000 mL | PREFILLED_SYRINGE | Freq: Once | INTRAMUSCULAR | Status: DC

## 2024-01-25 MED ORDER — DIBUCAINE (PERIANAL) 1 % EX OINT: 1.0000 | TOPICAL_OINTMENT | CUTANEOUS | Status: DC | PRN

## 2024-01-25 MED ORDER — IBUPROFEN 600 MG PO TABS
600.0000 mg | ORAL_TABLET | Freq: Four times a day (QID) | ORAL | Status: DC
  Administered 2024-01-25 – 2024-01-27 (×7): 600 mg via ORAL
  Filled 2024-01-25 (×7): qty 1

## 2024-01-25 MED ORDER — WITCH HAZEL-GLYCERIN EX PADS: 1.0000 | MEDICATED_PAD | CUTANEOUS | Status: DC | PRN

## 2024-01-25 MED ORDER — LIDOCAINE HCL (PF) 1 % IJ SOLN: 30.0000 mL | INTRAMUSCULAR | Status: DC | PRN

## 2024-01-25 MED ORDER — OXYCODONE-ACETAMINOPHEN 5-325 MG PO TABS: 1.0000 | ORAL_TABLET | ORAL | Status: DC | PRN

## 2024-01-25 MED ORDER — ONDANSETRON HCL 4 MG/2ML IJ SOLN
4.0000 mg | Freq: Four times a day (QID) | INTRAMUSCULAR | Status: DC | PRN
  Administered 2024-01-25: 4 mg via INTRAVENOUS
  Filled 2024-01-25: qty 2

## 2024-01-25 MED ORDER — ACETAMINOPHEN 325 MG PO TABS: 650.0000 mg | ORAL_TABLET | ORAL | Status: DC | PRN

## 2024-01-25 MED ORDER — DIPHENHYDRAMINE HCL 25 MG PO CAPS: 25.0000 mg | ORAL_CAPSULE | Freq: Four times a day (QID) | ORAL | Status: DC | PRN

## 2024-01-25 NOTE — Progress Notes (Signed)
 Patient ID: Lori Lam, female   DOB: [birth year], 28 y.o.   MRN: [id number]  Subjective: -Patient s/p epidural and reports improving comfort.    Objective: BP (!) 112/54   Pulse 67   Temp (!) 97.4 F (36.3 C) (Axillary)   Ht 5' 6 (1.676 m)   Wt 119.9 kg   LMP 04/24/2023 (Exact Date)   SpO2 100%   BMI 42.68 kg/m  No intake/output data recorded. No intake/output data recorded.  Fetal Monitoring: FHT: 115 bpm, Mod Var, -Decels, +Accels UC: Irregularly graphed, palpates moderate    Vaginal Exam: SVE:   Dilation: 4 Effacement (%): 80 Station: -2 Exam by:: Camie Lao RN Membranes: AROM x 6hrs Internal Monitors: None  Augmentation/Induction: Pitocin :9mUn/min Cytotec : None  Assessment:  IUP at 39.3 weeks Cat I FT  IOL   Plan: -Position change to promote fetal descent. -Anticipate SVD. -Continue other mgmt as ordered   Harlene LITTIE Synthia LAFE, CNM Advanced Practice Provider, Center for [facility] 01/25/2024, 4:12 PM

## 2024-01-25 NOTE — Anesthesia Procedure Notes (Signed)
 Epidural Patient location during procedure: OB Start time: 01/25/2024 2:16 PM End time: 01/25/2024 2:34 PM  Staffing Anesthesiologist: Cleotilde Butler Dade, MD Performed: anesthesiologist   Preanesthetic Checklist Completed: patient identified, IV checked, site marked, risks and benefits discussed, surgical consent, monitors and equipment checked, pre-op evaluation and timeout performed  Epidural Patient position: sitting Prep: ChloraPrep Patient monitoring: heart rate, cardiac monitor, continuous pulse ox and blood pressure Approach: midline Location: L2-L3 Injection technique: LOR saline  Needle:  Needle type: Tuohy  Needle gauge: 17 G Needle length: 9 cm Needle insertion depth: 8 cm Catheter type: closed end flexible Catheter size: 20 Guage Catheter at skin depth: 13 cm Test dose: negative  Assessment Events: blood not aspirated, injection not painful, no injection resistance, no paresthesia and negative IV test  Additional Notes Reason for block:procedure for pain

## 2024-01-25 NOTE — Anesthesia Preprocedure Evaluation (Addendum)
 Anesthesia Evaluation  Patient identified by MRN, date of birth, ID band Patient awake    Reviewed: Allergy & Precautions, H&P , Patient's Chart, lab work & pertinent test results  Airway Mallampati: II       Dental no notable dental hx.    Pulmonary former smoker   Pulmonary exam normal breath sounds clear to auscultation       Cardiovascular negative cardio ROS Normal cardiovascular exam     Neuro/Psych negative neurological ROS  negative psych ROS   GI/Hepatic Neg liver ROS,GERD  Medicated,,  Endo/Other  negative endocrine ROS    Renal/GU negative Renal ROS  negative genitourinary   Musculoskeletal   Abdominal  (+) + obese  Peds  Hematology  (+) Blood dyscrasia, anemia   Anesthesia Other Findings   Reproductive/Obstetrics (+) Pregnancy                              Anesthesia Physical Anesthesia Plan  ASA: 3  Anesthesia Plan: Epidural   Post-op Pain Management:    Induction:   PONV Risk Score and Plan:   Airway Management Planned:   Additional Equipment:   Intra-op Plan:   Post-operative Plan:   Informed Consent: I have reviewed the patients History and Physical, chart, labs and discussed the procedure including the risks, benefits and alternatives for the proposed anesthesia with the patient or authorized representative who has indicated his/her understanding and acceptance.       Plan Discussed with:   Anesthesia Plan Comments:          Anesthesia Quick Evaluation

## 2024-01-25 NOTE — Discharge Summary (Signed)
 Postpartum Discharge Summary     Patient Name: Lori Lam DOB: 07-Apr-1996 MRN: [id number]  Date of admission: 01/25/2024 Delivery date:01/25/2024 Delivering provider: SYNTHIA RAISIN Date of discharge: 01/27/2024  Admitting diagnosis: Indication for care in labor or delivery [O75.9] Intrauterine pregnancy: [redacted]w[redacted]d     Secondary diagnosis:  Principal Problem:   Indication for care in labor or delivery Active Problems:   Vaginal delivery  Additional problems: N/A    Discharge diagnosis: Term Pregnancy Delivered                                              Post partum procedures:None Augmentation: AROM and Pitocin  Complications: None  Hospital course: Induction of Labor With Vaginal Delivery   28 y.o. yo [id number] at [redacted]w[redacted]d was admitted to the hospital 01/25/2024 for induction of labor.  Indication for induction: Obesity with BMI of 42.  Patient had an labor course complicated that was uncomplicated. Membrane Rupture Time/Date: 9:37 AM,01/25/2024  Delivery Method:Vaginal, Spontaneous Operative Delivery:N/A Episiotomy: None Lacerations:  None Details of delivery can be found in separate delivery note.  Patient had an uncomplicated postpartum course. Patient is discharged home 01/27/24.  Newborn Data: Birth date:01/25/2024 Birth time:5:13 PM Gender:Female-Shakur Living status:Living Apgars:8 ,9  Weight:3600 g  Magnesium Sulfate received: No BMZ received: No Rhophylac:No MMR:No T-DaP:Given prenatally Flu: No RSV Vaccine received: No Transfusion:No  Immunizations received: Immunization History  Administered Date(s) Administered   Pneumococcal Polysaccharide-23 12/28/2012   Tdap 12/29/2012, 09/26/2016, 09/06/2021, 11/08/2023    Physical exam  Vitals:   01/26/24 0857 01/26/24 1944 01/26/24 2257 01/27/24 0430  BP: 127/81 (!) 129/59  119/60  Pulse: 61 60  65  Resp: 16 17  18   Temp: 98.7 F (37.1 C) 98.1 F (36.7 C) 97.6 F (36.4 C) 97.6 F (36.4 C)  TempSrc:  Oral Oral Oral Oral  SpO2: 100% 100%  100%  Weight:      Height:       General: alert, cooperative, and no distress Lochia: appropriate Uterine Fundus: firm Incision: N/A DVT Evaluation: No evidence of DVT seen on physical exam. Labs: Lab Results  Component Value Date   WBC 10.8 (H) 01/26/2024   HGB 11.3 (L) 01/26/2024   HCT 34.6 (L) 01/26/2024   MCV 86.9 01/26/2024   PLT 247 01/26/2024      Latest Ref Rng & Units 06/23/2023   11:42 AM  CMP  Glucose 70 - 99 mg/dL 90   BUN 6 - 20 mg/dL 5   Creatinine 9.55 - 8.99 mg/dL 9.11   Sodium 864 - 854 mmol/L 135   Potassium 3.5 - 5.1 mmol/L 3.8   Chloride 98 - 111 mmol/L 103   CO2 22 - 32 mmol/L 25   Calcium 8.9 - 10.3 mg/dL 9.2   Total Protein 6.5 - 8.1 g/dL 7.2   Total Bilirubin 0.0 - 1.2 mg/dL 0.4   Alkaline Phos 38 - 126 U/L 56   AST 15 - 41 U/L 17   ALT 0 - 44 U/L 22    Edinburgh Score:    01/26/2024    1:09 PM  Edinburgh Postnatal Depression Scale Screening Tool  I have been able to laugh and see the funny side of things. 0  I have looked forward with enjoyment to things. 0  I have blamed myself unnecessarily when things went wrong. 0  I have  been anxious or worried for no good reason. 0  I have felt scared or panicky for no good reason. 0  Things have been getting on top of me. 0  I have been so unhappy that I have had difficulty sleeping. 0  I have felt sad or miserable. 0  I have been so unhappy that I have been crying. 0  The thought of harming myself has occurred to me. 0  Edinburgh Postnatal Depression Scale Total 0   Edinburgh Postnatal Depression Scale Total: 0   After visit meds:  Allergies as of 01/27/2024   No Known Allergies      Medication List     STOP taking these medications    Adult Blood Pressure Cuff Lg Kit   aspirin  EC 81 MG tablet   ferrous sulfate  325 (65 FE) MG EC tablet   ondansetron  4 MG disintegrating tablet Commonly known as: ZOFRAN -ODT       TAKE these medications     acetaminophen  325 MG tablet Commonly known as: Tylenol  Take 2 tablets (650 mg total) by mouth every 4 (four) hours as needed (for pain scale < 4).   albuterol  108 (90 Base) MCG/ACT inhaler Commonly known as: VENTOLIN  HFA Inhale 2 puffs into the lungs every 6 (six) hours as needed for wheezing or shortness of breath.   cyclobenzaprine  10 MG tablet Commonly known as: FLEXERIL  Take 1 tablet (10 mg total) by mouth at bedtime.   fluticasone  50 MCG/ACT nasal spray Commonly known as: FLONASE  Place 2 sprays into both nostrils daily.   ibuprofen  600 MG tablet Commonly known as: ADVIL  Take 1 tablet (600 mg total) by mouth every 6 (six) hours.   loratadine  10 MG tablet Commonly known as: CLARITIN  Take 1 tablet (10 mg total) by mouth daily.   multivitamin-prenatal 27-0.8 MG Tabs tablet Take 1 tablet by mouth daily at 12 noon.   senna-docusate 8.6-50 MG tablet Commonly known as: Senokot-S Take 2 tablets by mouth daily.         Discharge home in stable condition Infant Feeding: Bottle and Breast Infant Disposition:home with mother Discharge instruction: per After Visit Summary and Postpartum booklet. Activity: Advance as tolerated. Pelvic rest for 6 weeks.  Diet: routine diet  Future Appointments: Future Appointments  Date Time Provider Department Center  03/13/2024  8:35 AM Vannie Cornell SAUNDERS, CNM [facility]   Follow up Visit: Not sent. PPV previously scheduled.    01/27/2024 Ellorie Kindall LITTIE Angles, MD

## 2024-01-25 NOTE — Progress Notes (Signed)
 Patient ID: Lori Lam, female   DOB: [birth year], 28 y.o.   MRN: [id number]  Subjective: -Care assumed of 28 y.o. Lori Lam at [redacted]w[redacted]d who presents for IOL s/t BMI. In room to meet acquaintance of patient and family.  Patient resting in bed. Reports comfort and occasional BH ctx.    Objective: Ht 5' 6 (1.676 m)   Wt 119.9 kg   LMP 04/24/2023 (Exact Date)   BMI 42.68 kg/m  No intake/output data recorded. No intake/output data recorded.  Fetal Monitoring: FHT: 120 bpm, Mod Var, -Decels, +Accels UC: Irritability    Physical Exam: General appearance: alert, well appearing, and in no distress. Chest: normal rate and regular rhythm.  clear to auscultation, no wheezes, rales or rhonchi, symmetric air entry. Abdominal exam: Gravid, Soft. Extremities: Mild Edema Skin exam: Warm Dry  Vaginal Exam: SVE:   Dilation: 2.5 Effacement (%): 70 Station: -3 Exam by:: J.Melvenia Favela, CNM Membranes:AROM with small amt clear fluid Internal Monitors: None  Augmentation/Induction: Pitocin :Initiate Cytotec : None  Assessment:  IUP at 39.3 weeks Cat I FT  Amniotomy IOL  Plan: -Discussed AROM r/b including increased risk of infection, cord prolapse, fetal intolerance, and decreased labor time.  -No questions or concerns and patient desires to proceed with AROM.  -Discussed initiation of pitocin  to promote regular contractions. -Patient expresses concern with anticipated pain once contraction start. -Reassurance and support given. Discussed availability of pain medications. -Encouraged to start with least invasive and consider epidural once contractions become regular. -Continue other mgmt as ordered.  Harlene CROME Lori Mcevoy,MSN, CNM 01/25/2024, 9:29 AM

## 2024-01-25 NOTE — H&P (Signed)
 OBSTETRIC ADMISSION HISTORY AND PHYSICAL  Lori Lam is a 28 y.o. female [phone] with IUP at [redacted]w[redacted]d by LMP c/w [redacted]w[redacted]d U/S presenting for scheduled IOL for obesity. She reports +FMs, No LOF, no VB, no blurry vision, headaches or peripheral edema, and RUQ pain.  She plans on breast feeding. She is unsure of her desire for birth control. She received her prenatal care at [facility]   Dating: By LMP --->  Estimated Date of Delivery: 01/29/24  Sono:    @[redacted]w[redacted]d , CWD, normal anatomy, cephalic presentation, 3403g, 36% EFW   Prenatal History/Complications: asthma, depression, obesity, hx IUGR in previous pregnancy  Past Medical History: Past Medical History:  Diagnosis Date   Anemia    Anxiety    Asthma    Bacterial vaginitis    Chlamydia contact, treated    GERD (gastroesophageal reflux disease)    H/O chlamydia infection 12/28/2012   Treated again 08/2015 Negative 08/30/16   Headache    Mild concussion 12/21/2017    Past Surgical History: Past Surgical History:  Procedure Laterality Date   HERNIA REPAIR     INSERTION OF IMPLANON  ROD  12/08/2021    Obstetrical History: OB History     Gravida  5   Para  3   Term  3   Preterm  0   AB  1   Living  3      SAB  0   IAB  1   Ectopic  0   Multiple  0   Live Births  3           Social History Social History   Socioeconomic History   Marital status: Married    Spouse name: Not on file   Number of children: Not on file   Years of education: Not on file   Highest education level: Some college, no degree  Occupational History   Not on file  Tobacco Use   Smoking status: Former    Current packs/day: 0.00    Types: Cigarettes    Quit date: 03/2021    Years since quitting: 2.9   Smokeless tobacco: Never   Tobacco comments:    2 cigs/day  Vaping Use   Vaping status: Never Used  Substance and Sexual Activity   Alcohol use: Not Currently    Comment: socially, not since confirmed pregnancy    Drug use: No   Sexual activity: Yes    Partners: Male    Birth control/protection: None  Other Topics Concern   Not on file  Social History Narrative   Not on file   Social Drivers of Health   Financial Resource Strain: Low Risk  (11/08/2023)   Overall Financial Resource Strain (CARDIA)    Difficulty of Paying Living Expenses: Not hard at all  Food Insecurity: No Food Insecurity (01/25/2024)   Hunger Vital Sign    Worried About Running Out of Food in the Last Year: Never true    Ran Out of Food in the Last Year: Never true  Transportation Needs: No Transportation Needs (01/25/2024)   PRAPARE - Administrator, Civil Service (Medical): No    Lack of Transportation (Non-Medical): No  Physical Activity: Insufficiently Active (11/08/2023)   Exercise Vital Sign    Days of Exercise per Week: 4 days    Minutes of Exercise per Session: 20 min  Stress: No Stress Concern Present (11/08/2023)   [employer] of Occupational Health - Occupational Stress Questionnaire    Feeling  of Stress: Not at all  Social Connections: Moderately Integrated (11/08/2023)   Social Connection and Isolation Panel    Frequency of Communication with Friends and Family: More than three times a week    Frequency of Social Gatherings with Friends and Family: Three times a week    Attends Religious Services: More than 4 times per year    Active Member of Clubs or Organizations: No    Attends Engineer, structural: Not on file    Marital Status: Married    Family History: Family History  Problem Relation Age of Onset   Hypertension Mother    Hypertension Father    Hypertension Maternal Grandmother    Hypertension Maternal Grandfather    Hypertension Paternal Grandmother    Hypertension Other    Diabetes Other     Allergies: No Known Allergies  Medications Prior to Admission  Medication Sig Dispense Refill Last Dose/Taking   albuterol  (VENTOLIN  HFA) 108 (90 Base) MCG/ACT inhaler Inhale 2  puffs into the lungs every 6 (six) hours as needed for wheezing or shortness of breath. 18 g 1 Past Week   aspirin  EC 81 MG tablet Take 1 tablet (81 mg total) by mouth daily. Swallow whole. 30 tablet 12 01/24/2024   loratadine  (CLARITIN ) 10 MG tablet Take 1 tablet (10 mg total) by mouth daily. 30 tablet 6 Past Week   ondansetron  (ZOFRAN -ODT) 4 MG disintegrating tablet Take 1 tablet (4 mg total) by mouth every 6 (six) hours as needed for nausea. 20 tablet 5 Past Month   Prenatal Vit-Fe Fumarate-FA (MULTIVITAMIN-PRENATAL) 27-0.8 MG TABS tablet Take 1 tablet by mouth daily at 12 noon.   01/24/2024   Blood Pressure Monitoring (ADULT BLOOD PRESSURE CUFF LG) KIT 1 Device by Does not apply route once a week. 1 kit 0    cyclobenzaprine  (FLEXERIL ) 10 MG tablet Take 1 tablet (10 mg total) by mouth at bedtime. 30 tablet 1    ferrous sulfate  325 (65 FE) MG EC tablet Take 1 tablet (325 mg total) by mouth every other day. (Patient not taking: Reported on 01/24/2024) 30 tablet 1    fluticasone  (FLONASE ) 50 MCG/ACT nasal spray Place 2 sprays into both nostrils daily. 18.2 mL 5      Review of Systems   All systems reviewed and negative except as stated in HPI  Height 5' 6 (1.676 m), weight 119.9 kg, last menstrual period 04/24/2023, not currently breastfeeding. General appearance: alert, cooperative, and no distress Lungs: clear to auscultation bilaterally Heart: regular rate and rhythm Abdomen: soft, non-tender; bowel sounds normal Pelvic: Proven to 3495g Extremities: Homans sign is negative, no sign of DVT Presentation: cephalic Fetal monitoringBaseline: 120 bpm, Variability: Good {> 6 bpm), Accelerations: Reactive, and Decelerations: Absent Uterine activityNone     Prenatal labs: ABO, Rh: B/Positive/-- (03/10 1044) Antibody: Negative (03/10 1044) Rubella: 6.13 (03/10 1044) RPR: Reactive (07/09 0822)  HBsAg: Negative (03/10 1044)  HIV: Non Reactive (07/09 0822)  GBS: Negative/-- (09/03 1721)     Lab Results  Component Value Date   GBS Negative 01/03/2024   GTT normal Genetic screening  low-risk Anatomy US  normal  Immunization History  Administered Date(s) Administered   Pneumococcal Polysaccharide-23 12/28/2012   Tdap 12/29/2012, 09/26/2016, 09/06/2021, 11/08/2023    Prenatal Transfer Tool  Maternal Diabetes: No Genetic Screening: Normal Maternal Ultrasounds/Referrals: Normal Fetal Ultrasounds or other Referrals:  None Maternal Substance Abuse:  No Significant Maternal Medications:  None Significant Maternal Lab Results: Group B Strep negative Number of Prenatal Visits:greater  than 3 verified prenatal visits Maternal Vaccinations:TDap Other Comments:  None   No results found for this or any previous visit (from the past 24 hours).  Patient Active Problem List   Diagnosis Date Noted   Indication for care in labor or delivery 01/25/2024   Hx of intrauterine growth restriction in prior pregnancy, currently pregnant, third trimester 09/05/2023   Supervision of high-risk pregnancy 07/04/2023   Obesity in pregnancy, antepartum, third trimester 08/16/2021   Asthma 12/28/2012   Depression 12/28/2012    Assessment/Plan:  Lori Lam is a 28 y.o. H4E6986 at [redacted]w[redacted]d here for scheduled IOL  #Labor:AROM with pitocin .  #Pain: As Desired #FWB: Reactive #GBS status:  negative #Feeding: Breastmilk  #Reproductive Life planning: Undecided #Circ:  N/A  Charlie DELENA Courts, MD  01/25/2024, 7:11 AM   Harlene LITTIE Duncans MSN, CNM Advanced Practice Provider, Center for [facility]

## 2024-01-26 ENCOUNTER — Ambulatory Visit: Payer: MEDICAID

## 2024-01-26 ENCOUNTER — Other Ambulatory Visit: Payer: MEDICAID

## 2024-01-26 LAB — CBC
HCT: 34.6 % — ABNORMAL LOW (ref 36.0–46.0)
Hemoglobin: 11.3 g/dL — ABNORMAL LOW (ref 12.0–15.0)
MCH: 28.4 pg (ref 26.0–34.0)
MCHC: 32.7 g/dL (ref 30.0–36.0)
MCV: 86.9 fL (ref 80.0–100.0)
Platelets: 247 K/uL (ref 150–400)
RBC: 3.98 MIL/uL (ref 3.87–5.11)
RDW: 14 % (ref 11.5–15.5)
WBC: 10.8 K/uL — ABNORMAL HIGH (ref 4.0–10.5)
nRBC: 0 % (ref 0.0–0.2)

## 2024-01-26 LAB — BIRTH TISSUE RECOVERY COLLECTION (PLACENTA DONATION)

## 2024-01-26 MED ORDER — OXYCODONE HCL 5 MG PO TABS
5.0000 mg | ORAL_TABLET | Freq: Four times a day (QID) | ORAL | Status: DC | PRN
Start: 2024-01-26 — End: 2024-01-27
  Administered 2024-01-26 – 2024-01-27 (×4): 5 mg via ORAL
  Filled 2024-01-26 (×5): qty 1

## 2024-01-26 NOTE — Lactation Note (Signed)
 This note was copied from a baby's chart. Lactation Consultation Note  Patient Name: Lori Lam Unijb'd Date: 01/26/2024 Age:30 hours Reason for consult: Term  P4. Mom is BF/formula feeding. Mom is doing more bottle than breast right now. Encouraged mom to put baby on the breast before giving formula. Reviewed supplement amount when BF verse just formula feeding. Mom stated she will do both. Mom stated she has some colostrum at home in freezer.  Newborn feeding habits, STS, I&O reviewed.  Mom encouraged to feed baby 8-12 times/24 hours and with feeding cues.  Mom doesn't have any questions or concerns right now. Encouraged mom to call for assistance as needed. Maternal Data Does the patient have breastfeeding experience prior to this delivery?: Yes How long did the patient breastfeed?: 2 months each  Feeding Nipple Type: Extra Slow Flow  LATCH Score                    Lactation Tools Discussed/Used    Interventions Interventions: Breast feeding basics reviewed;Education;LC Services brochure  Discharge Pump: DEBP  Consult Status Consult Status: Follow-up Date: 01/26/24 Follow-up type: In-patient    Lori Lam G 01/26/2024, 3:13 AM

## 2024-01-26 NOTE — Anesthesia Postprocedure Evaluation (Signed)
 Anesthesia Post Note  Patient: Lori Lam  Procedure(s) Performed: AN AD HOC LABOR EPIDURAL     Patient location during evaluation: Mother Baby Anesthesia Type: Epidural Level of consciousness: awake, oriented and awake and alert Pain management: pain level controlled Vital Signs Assessment: post-procedure vital signs reviewed and stable Respiratory status: spontaneous breathing, nonlabored ventilation and respiratory function stable Cardiovascular status: stable Postop Assessment: no headache, patient able to bend at knees, adequate PO intake, able to ambulate and no apparent nausea or vomiting Anesthetic complications: no   No notable events documented.  Last Vitals:  Vitals:   01/26/24 0635 01/26/24 0857  BP: 123/71 127/81  Pulse: 65 61  Resp: 12 16  Temp: 36.6 C 37.1 C  SpO2: 100% 100%    Last Pain:  Vitals:   01/26/24 0857  TempSrc: Oral  PainSc: 4    Pain Goal:                Epidural/Spinal Function Cutaneous sensation: Normal sensation (01/26/24 0857), Patient able to flex knees: Yes (01/26/24 0857), Patient able to lift hips off bed: Yes (01/26/24 0857), Back pain beyond tenderness at insertion site: No (01/26/24 0857), Progressively worsening motor and/or sensory loss: No (01/26/24 0857), Bowel and/or bladder incontinence post epidural: No (01/26/24 0857)  Dayshaun Whobrey

## 2024-01-26 NOTE — Lactation Note (Signed)
 This note was copied from a baby's chart. Lactation Consultation Note  Patient Name: Lori Lam Date: 01/26/2024 Age:30 hours Reason for consult: Follow-up assessment;Mother's request;Term;Nipple pain/trauma  P4- MOB called out asking for a consult. MOB reported that she was abl to hand express 5 mL EBM out of the right breast, but only 2 mL out of the left breast. MOB also reported that the left breast feels engorged and she wants the hospital DEBP to be set up. LC praised MOB for the amount that she was able to express. LC reassured MOB that it is normal for one side to produce more. LC requested to feel the left breast for the engorgement. MOB agreed. LC noted that the left breast is slightly more firm than the right, but it felt like normal anatomy. No filling or engorgement noted. LC sent up the hospital DEBP (size 27 mm flanges) and reviewed how to use it. LC also provided MOB ice pack to place on her breast to reduce the inflammation. LC encouraged MOB to call for further assistance as needed.  Maternal Data Has patient been taught Hand Expression?: Yes Does the patient have breastfeeding experience prior to this delivery?: Yes  Feeding Mother's Current Feeding Choice: Breast Milk and Formula Nipple Type: Extra Slow Flow  Lactation Tools Discussed/Used Tools: Pump;Flanges;Coconut oil Flange Size: 27 Breast pump type: Double-Electric Breast Pump;Manual Pump Education: Setup, frequency, and cleaning;Milk Storage Reason for Pumping: MOB request Pumping frequency: 15-20 min every 3 hrs Pumped volume: 7 mL  Interventions Interventions: Breast feeding basics reviewed;Coconut oil;Hand pump;DEBP;Education;LC Services brochure  Discharge Discharge Education: Engorgement and breast care;Warning signs for feeding baby Pump: DEBP;Manual;Hands Free;Personal  Consult Status Consult Status: Follow-up Date: 01/27/24 Follow-up type: In-patient    Recardo Hoit BS,  IBCLC 01/26/2024, 6:18 PM

## 2024-01-26 NOTE — Progress Notes (Signed)
 POSTPARTUM PROGRESS NOTE  Post Partum Day #1  Subjective:  Lori Lam is a 28 y.o. H4E5985 s/p SVD at [redacted]w[redacted]d.  She reports she is doing well. No acute events overnight. She denies any problems with ambulating, voiding or po intake. Denies nausea or vomiting.  Pain is well controlled.  Lochia is normal.  Objective: Blood pressure 127/81, pulse 61, temperature 98.7 F (37.1 C), temperature source Oral, resp. rate 16, height 5' 6 (1.676 m), weight 119.9 kg, last menstrual period 04/24/2023, SpO2 100%, unknown if currently breastfeeding.  Physical Exam:  General: alert, cooperative and no distress Chest: no respiratory distress Heart:regular rate, distal pulses intact Abdomen: soft, nontender,  Uterine Fundus: firm, appropriately tender DVT Evaluation: No calf swelling or tenderness Extremities: No edema Skin: warm, dry  Recent Labs    01/25/24 1851 01/26/24 0446  HGB 11.2* 11.3*  HCT 34.2* 34.6*    Assessment/Plan: Lori Lam is a 28 y.o. H4E5985 s/p SVD at [redacted]w[redacted]d   PPD#1 - Doing well  Routine postpartum care  Contraception: Still undecided Feeding: Breast Dispo: Plan for discharge tomorrow.   LOS: 1 day   Barkley Angles, MD OB Fellow, Faculty Practice [facility], [facility] [employer]

## 2024-01-26 NOTE — Progress Notes (Signed)
 MOB was referred for history of depression/anxiety.  * Referral screened out by Clinical Social Worker because none of the following criteria appear to apply:  ~ History of anxiety/depression during this pregnancy, or of post-partum depression following prior delivery.  ~ Diagnosis of anxiety and/or depression within last 3 years Per OB notes, MOB did not indicate any signs/symptoms during her pregnancy.  Edin 0  Anxiety 2014  OR  * MOB's symptoms currently being treated with medication and/or therapy.  Please contact the Clinical Social Worker if needs arise, by [facility] request, or if MOB scores greater than 9/yes to question 10 on Edinburgh Postpartum Depression Screen.  Rosina Molt, [place] Clinical Social Worker [phone]

## 2024-01-26 NOTE — Lactation Note (Signed)
 This note was copied from a baby's chart. Lactation Consultation Note  Patient Name: Lori Lam Unijb'd Date: 01/26/2024 Age:30 hours Reason for consult: Follow-up assessment;Term  P4- MOB's feeding plan is to offer both breast milk and formula. So far, infant has primarily been offered bottle vs breast. MOB was provided with a manual pump and size 27 mm flange by her RN. MOB demonstrated how the 27 mm flange fits and LC agrees to MOB using this size at this time. LC reviewed how MOB's nipples may change and how to tell if the flange is the correct fit. MOB reports that her left nipple hurts when she pumps or latches infant. MOB informed lC that this happened with all of her children until after a few weeks. MOB showed LC her left nipple. LC noted that it was round and everted, but inverts slightly in the center. That is the part that is causing her pain. LC reviewed how this is mostly likely due to the pump and the infant have been stretching that center part of her nipple. LC reviewed nipple care and encouraged her to use coconut oil. LC encouraged MOB to call for further assistance as needed.  Maternal Data Has patient been taught Hand Expression?: No Does the patient have breastfeeding experience prior to this delivery?: Yes How long did the patient breastfeed?: 2 months for each older child  Feeding Mother's Current Feeding Choice: Breast Milk and Formula Nipple Type: Extra Slow Flow  Lactation Tools Discussed/Used Tools: Pump;Flanges Flange Size: 27 Breast pump type: Manual Pump Education: Setup, frequency, and cleaning;Milk Storage Reason for Pumping: MOB request Pumping frequency: 15-20 min every 3 hrs  Interventions Interventions: Breast feeding basics reviewed;Hand pump;Education;LC Services brochure  Discharge Discharge Education: Engorgement and breast care;Warning signs for feeding baby Pump: DEBP;Manual;Hands Free;Personal (spctra and elvie stride)  Consult  Status Consult Status: Follow-up Date: 01/27/24 Follow-up type: In-patient    Recardo Hoit BS, IBCLC 01/26/2024, 1:31 PM

## 2024-01-27 MED ORDER — SENNOSIDES-DOCUSATE SODIUM 8.6-50 MG PO TABS: 2.0000 | ORAL_TABLET | ORAL | 0 refills | Status: DC

## 2024-01-27 MED ORDER — IBUPROFEN 600 MG PO TABS: 600.0000 mg | ORAL_TABLET | Freq: Four times a day (QID) | ORAL | 0 refills | Status: DC

## 2024-01-27 MED ORDER — ACETAMINOPHEN 325 MG PO TABS: 650.0000 mg | ORAL_TABLET | ORAL | Status: DC | PRN

## 2024-01-27 NOTE — Plan of Care (Signed)
 Problem: Education: Goal: Knowledge of General Education information will improve Description: Including pain rating scale, medication(s)/side effects and non-pharmacologic comfort measures 01/27/2024 1156 by Madison Rosina LABOR, LPN Outcome: Adequate for Discharge 01/27/2024 1156 by Madison Rosina LABOR, LPN Outcome: Progressing 01/27/2024 0818 by Madison Rosina LABOR, LPN Outcome: Progressing   Problem: Health Behavior/Discharge Planning: Goal: Ability to manage health-related needs will improve 01/27/2024 1156 by Madison Rosina LABOR, LPN Outcome: Adequate for Discharge 01/27/2024 1156 by Madison Rosina LABOR, LPN Outcome: Progressing 01/27/2024 0818 by Madison Rosina LABOR, LPN Outcome: Progressing   Problem: Clinical Measurements: Goal: Ability to maintain clinical measurements within normal limits will improve 01/27/2024 1156 by Madison Rosina LABOR, LPN Outcome: Adequate for Discharge 01/27/2024 1156 by Madison Rosina LABOR, LPN Outcome: Progressing 01/27/2024 0818 by Madison Rosina LABOR, LPN Outcome: Progressing Goal: Will remain free from infection 01/27/2024 1156 by Madison Rosina LABOR, LPN Outcome: Adequate for Discharge 01/27/2024 1156 by Madison Rosina LABOR, LPN Outcome: Progressing 01/27/2024 0818 by Madison Rosina LABOR, LPN Outcome: Progressing Goal: Diagnostic test results will improve 01/27/2024 1156 by Madison Rosina LABOR, LPN Outcome: Adequate for Discharge 01/27/2024 1156 by Madison Rosina LABOR, LPN Outcome: Progressing 01/27/2024 0818 by Madison Rosina LABOR, LPN Outcome: Progressing Goal: Respiratory complications will improve 01/27/2024 1156 by Madison Rosina LABOR, LPN Outcome: Adequate for Discharge 01/27/2024 1156 by Madison Rosina LABOR, LPN Outcome: Progressing 01/27/2024 0818 by Madison Rosina LABOR, LPN Outcome: Progressing Goal: Cardiovascular complication will be avoided 01/27/2024 1156 by Madison Rosina LABOR, LPN Outcome: Adequate for Discharge 01/27/2024 1156 by Madison Rosina LABOR, LPN Outcome: Progressing 01/27/2024 0818 by Madison Rosina LABOR, LPN Outcome:  Progressing   Problem: Activity: Goal: Risk for activity intolerance will decrease 01/27/2024 1156 by Madison Rosina LABOR, LPN Outcome: Adequate for Discharge 01/27/2024 1156 by Madison Rosina LABOR, LPN Outcome: Progressing 01/27/2024 0818 by Madison Rosina LABOR, LPN Outcome: Progressing   Problem: Nutrition: Goal: Adequate nutrition will be maintained 01/27/2024 1156 by Madison Rosina LABOR, LPN Outcome: Adequate for Discharge 01/27/2024 1156 by Madison Rosina LABOR, LPN Outcome: Progressing 01/27/2024 0818 by Madison Rosina LABOR, LPN Outcome: Progressing   Problem: Coping: Goal: Level of anxiety will decrease 01/27/2024 1156 by Madison Rosina LABOR, LPN Outcome: Adequate for Discharge 01/27/2024 1156 by Madison Rosina LABOR, LPN Outcome: Progressing 01/27/2024 0818 by Madison Rosina LABOR, LPN Outcome: Progressing   Problem: Elimination: Goal: Will not experience complications related to bowel motility 01/27/2024 1156 by Madison Rosina LABOR, LPN Outcome: Adequate for Discharge 01/27/2024 1156 by Madison Rosina LABOR, LPN Outcome: Progressing 01/27/2024 0818 by Madison Rosina LABOR, LPN Outcome: Progressing Goal: Will not experience complications related to urinary retention 01/27/2024 1156 by Madison Rosina LABOR, LPN Outcome: Adequate for Discharge 01/27/2024 1156 by Madison Rosina LABOR, LPN Outcome: Progressing 01/27/2024 0818 by Madison Rosina LABOR, LPN Outcome: Progressing   Problem: Pain Managment: Goal: General experience of comfort will improve and/or be controlled 01/27/2024 1156 by Madison Rosina LABOR, LPN Outcome: Adequate for Discharge 01/27/2024 1156 by Madison Rosina LABOR, LPN Outcome: Progressing 01/27/2024 0818 by Madison Rosina LABOR, LPN Outcome: Progressing   Problem: Safety: Goal: Ability to remain free from injury will improve 01/27/2024 1156 by Madison Rosina LABOR, LPN Outcome: Adequate for Discharge 01/27/2024 1156 by Madison Rosina LABOR, LPN Outcome: Progressing 01/27/2024 0818 by Madison Rosina LABOR, LPN Outcome: Progressing   Problem: Skin Integrity: Goal:  Risk for impaired skin integrity will decrease 01/27/2024 1156 by Madison Rosina LABOR, LPN Outcome: Adequate for Discharge 01/27/2024 1156 by Madison Rosina LABOR, LPN Outcome: Progressing 01/27/2024 0818 by Madison,  Rosina LABOR, LPN Outcome: Progressing   Problem: Education: Goal: Knowledge of Childbirth will improve 01/27/2024 1156 by Madison Rosina LABOR, LPN Outcome: Adequate for Discharge 01/27/2024 1156 by Madison Rosina LABOR, LPN Outcome: Progressing 01/27/2024 0818 by Madison Rosina LABOR, LPN Outcome: Progressing Goal: Ability to make informed decisions regarding treatment and plan of care will improve 01/27/2024 1156 by Madison Rosina LABOR, LPN Outcome: Adequate for Discharge 01/27/2024 1156 by Madison Rosina LABOR, LPN Outcome: Progressing 01/27/2024 0818 by Madison Rosina LABOR, LPN Outcome: Progressing Goal: Ability to state and carry out methods to decrease the pain will improve 01/27/2024 1156 by Madison Rosina LABOR, LPN Outcome: Adequate for Discharge 01/27/2024 1156 by Madison Rosina LABOR, LPN Outcome: Progressing 01/27/2024 0818 by Madison Rosina LABOR, LPN Outcome: Progressing Goal: Individualized Educational Video(s) 01/27/2024 1156 by Madison Rosina LABOR, LPN Outcome: Adequate for Discharge 01/27/2024 1156 by Madison Rosina LABOR, LPN Outcome: Progressing 01/27/2024 0818 by Madison Rosina LABOR, LPN Outcome: Progressing   Problem: Coping: Goal: Ability to verbalize concerns and feelings about labor and delivery will improve 01/27/2024 1156 by Madison Rosina LABOR, LPN Outcome: Adequate for Discharge 01/27/2024 1156 by Madison Rosina LABOR, LPN Outcome: Progressing 01/27/2024 0818 by Madison Rosina LABOR, LPN Outcome: Progressing   Problem: Life Cycle: Goal: Ability to make normal progression through stages of labor will improve 01/27/2024 1156 by Madison Rosina LABOR, LPN Outcome: Adequate for Discharge 01/27/2024 1156 by Madison Rosina LABOR, LPN Outcome: Progressing 01/27/2024 0818 by Madison Rosina LABOR, LPN Outcome: Progressing Goal: Ability to effectively push during  vaginal delivery will improve 01/27/2024 1156 by Madison Rosina LABOR, LPN Outcome: Adequate for Discharge 01/27/2024 1156 by Madison Rosina LABOR, LPN Outcome: Progressing 01/27/2024 0818 by Madison Rosina LABOR, LPN Outcome: Progressing   Problem: Role Relationship: Goal: Will demonstrate positive interactions with the child 01/27/2024 1156 by Madison Rosina LABOR, LPN Outcome: Adequate for Discharge 01/27/2024 1156 by Madison Rosina LABOR, LPN Outcome: Progressing 01/27/2024 0818 by Madison Rosina LABOR, LPN Outcome: Progressing   Problem: Safety: Goal: Risk of complications during labor and delivery will decrease 01/27/2024 1156 by Madison Rosina LABOR, LPN Outcome: Adequate for Discharge 01/27/2024 1156 by Madison Rosina LABOR, LPN Outcome: Progressing 01/27/2024 0818 by Madison Rosina LABOR, LPN Outcome: Progressing   Problem: Pain Management: Goal: Relief or control of pain from uterine contractions will improve 01/27/2024 1156 by Madison Rosina LABOR, LPN Outcome: Adequate for Discharge 01/27/2024 1156 by Madison Rosina LABOR, LPN Outcome: Progressing 01/27/2024 0818 by Madison Rosina LABOR, LPN Outcome: Progressing   Problem: Education: Goal: Knowledge of condition will improve 01/27/2024 1156 by Madison Rosina LABOR, LPN Outcome: Adequate for Discharge 01/27/2024 1156 by Madison Rosina LABOR, LPN Outcome: Progressing 01/27/2024 0818 by Madison Rosina LABOR, LPN Outcome: Progressing Goal: Individualized Educational Video(s) 01/27/2024 1156 by Madison Rosina LABOR, LPN Outcome: Adequate for Discharge 01/27/2024 1156 by Madison Rosina LABOR, LPN Outcome: Progressing 01/27/2024 0818 by Madison Rosina LABOR, LPN Outcome: Progressing Goal: Individualized Newborn Educational Video(s) 01/27/2024 1156 by Madison Rosina LABOR, LPN Outcome: Adequate for Discharge 01/27/2024 1156 by Madison Rosina LABOR, LPN Outcome: Progressing 01/27/2024 0818 by Madison Rosina LABOR, LPN Outcome: Progressing   Problem: Activity: Goal: Will verbalize the importance of balancing activity with adequate rest  periods 01/27/2024 1156 by Madison Rosina LABOR, LPN Outcome: Adequate for Discharge 01/27/2024 1156 by Madison Rosina LABOR, LPN Outcome: Progressing 01/27/2024 0818 by Madison Rosina LABOR, LPN Outcome: Progressing Goal: Ability to tolerate increased activity will improve 01/27/2024 1156 by Madison Rosina LABOR, LPN Outcome: Adequate for Discharge 01/27/2024 1156 by Madison,  Rosina LABOR, LPN Outcome: Progressing 01/27/2024 0818 by Madison Rosina LABOR, LPN Outcome: Progressing   Problem: Coping: Goal: Ability to identify and utilize available resources and services will improve 01/27/2024 1156 by Madison Rosina LABOR, LPN Outcome: Adequate for Discharge 01/27/2024 1156 by Madison Rosina LABOR, LPN Outcome: Progressing 01/27/2024 0818 by Madison Rosina LABOR, LPN Outcome: Progressing   Problem: Life Cycle: Goal: Chance of risk for complications during the postpartum period will decrease 01/27/2024 1156 by Madison Rosina LABOR, LPN Outcome: Adequate for Discharge 01/27/2024 1156 by Madison Rosina LABOR, LPN Outcome: Progressing 01/27/2024 0818 by Madison Rosina LABOR, LPN Outcome: Progressing   Problem: Role Relationship: Goal: Ability to demonstrate positive interaction with newborn will improve 01/27/2024 1156 by Madison Rosina LABOR, LPN Outcome: Adequate for Discharge 01/27/2024 1156 by Madison Rosina LABOR, LPN Outcome: Progressing 01/27/2024 0818 by Madison Rosina LABOR, LPN Outcome: Progressing   Problem: Skin Integrity: Goal: Demonstration of wound healing without infection will improve 01/27/2024 1156 by Madison Rosina LABOR, LPN Outcome: Adequate for Discharge 01/27/2024 1156 by Madison Rosina LABOR, LPN Outcome: Progressing 01/27/2024 0818 by Madison Rosina LABOR, LPN Outcome: Progressing

## 2024-01-27 NOTE — Discharge Instructions (Signed)

## 2024-01-27 NOTE — Plan of Care (Signed)

## 2024-01-27 NOTE — Lactation Note (Signed)
 This note was copied from a baby's chart. Lactation Consultation Note  Patient Name: Lori Lam Unijb'd Date: 01/27/2024 Age:30 hours  Reason for consult: Follow-up assessment;Maternal discharge;Term change in weight -1.66 to 5.26% weight loss.   P4, MOB feeding choices are breastfeeding and supplementing infant with formula. MOB choice was to formula feed infant last 3 feedings and not latch infant, this is her choice see flow sheet. LC suggested when not latching infant to use the DEBP to help establish her milk supply and prevent engorgement. MOB does have DEBP as well as wearable DEBP that she plans to use sparingly. MOB informed LC she knows that the wearable may not completely empty her breast so she only use it on the go. MOB feels infant is breastfeeding well she has not concerns and when latching,  infant will breastfeed for 30 minutes or longer. MOB plans is to latch and then supplement infant she has handout Feeding Guidelines MOB does not have any breastfeeding questions or concerns for LC at this time. MOB will continue to breastfeed infant by cues, on demand, 8-12 times within 24 hours, skin to skin. LC reviewed Community Resources for breastfeeding after hospital discharge such as: LC hotline, breastfeeding support group, LC outpatient clinic, and [facility]- MOB had [employer] brochure in her room.   Current Feeding Plan:  1- MOB will continue to breastfeed infant by cues, on demand, 8-12 times within 24 hours, will latch first every feeding before supplementing infant with formula. 2- If MOB decides not to latch infant at the breast and offer formula, MOB will use her  DEBP to help stimulate her supply and prevent engorgement. MOB hans handout  Feeding Guidelines    Discharge education: 1- LC discussed engorgement prevention and treatment 2- Discussed warning signs of dehydration in infant. 3- How to know if breastfeeding is going well.   Maternal Data     Feeding Mother's Current Feeding Choice: Breast Milk and Formula  LATCH Score  LC did not observe latch due infant receiving 30 mls of 20 kcal formula at recent feeding which was less than 2 hours when LC entered the room.                   Lactation Tools Discussed/Used    Interventions    Discharge Discharge Education: Engorgement and breast care;Warning signs for feeding baby Pump: DEBP;Hands Free;Personal (Per MOB, she has Spectra  S1 and wearble and hand pump at home.)  Consult Status Consult Status: Complete Date: 01/27/24 Follow-up type: In-patient    Lori Lam 01/27/2024, 12:03 PM

## 2024-01-31 ENCOUNTER — Encounter: Payer: MEDICAID | Admitting: Certified Nurse Midwife

## 2024-02-05 ENCOUNTER — Telehealth (HOSPITAL_COMMUNITY): Payer: Self-pay | Admitting: *Deleted

## 2024-02-05 NOTE — Telephone Encounter (Signed)
 02/05/2024  Name: Lori Lam MRN: [id number] DOB: 07-Mar-1996  Reason for Call:  Transition of Care Hospital Discharge Call  Contact Status: Patient Contact Status: Complete  Language assistant needed:          Follow-Up Questions: Do You Have Any Concerns About Your Health As You Heal From Delivery?: No Do You Have Any Concerns About Your Infants Health?: No  Edinburgh Postnatal Depression Scale:  In the Past 7 Days:  Patient reported no change in her answers since completing EPDS in the hospital on 01/26/24. Score at that time was 0. EPDS not completed at this time.   PHQ2-9 Depression Scale:     Discharge Follow-up: Edinburgh score requires follow up?: N/A Patient was advised of the following resources:: Breastfeeding Support Group, Support Group Requested email information - sent by RN. Post-discharge interventions: Reviewed Newborn Safe Sleep Practices  Signature Allean IVAR Carton, RN, 02/05/24, [phone]

## 2024-02-07 ENCOUNTER — Encounter: Payer: MEDICAID | Admitting: Certified Nurse Midwife

## 2024-02-14 ENCOUNTER — Encounter: Payer: MEDICAID | Admitting: Certified Nurse Midwife

## 2024-03-13 ENCOUNTER — Encounter: Payer: Self-pay | Admitting: Certified Nurse Midwife

## 2024-03-13 ENCOUNTER — Ambulatory Visit (INDEPENDENT_AMBULATORY_CARE_PROVIDER_SITE_OTHER): Payer: MEDICAID | Admitting: Certified Nurse Midwife

## 2024-03-13 DIAGNOSIS — O165 Unspecified maternal hypertension, complicating the puerperium: Secondary | ICD-10-CM

## 2024-03-13 DIAGNOSIS — O99345 Other mental disorders complicating the puerperium: Secondary | ICD-10-CM | POA: Insufficient documentation

## 2024-03-13 MED ORDER — SERTRALINE HCL 25 MG PO TABS: 25.0000 mg | ORAL_TABLET | Freq: Every day | ORAL | 6 refills | Status: DC

## 2024-03-13 MED ORDER — AMLODIPINE BESYLATE 5 MG PO TABS: 5.0000 mg | ORAL_TABLET | Freq: Every day | ORAL | 2 refills | Status: DC

## 2024-03-13 NOTE — Progress Notes (Signed)
 Post Partum Visit Note  Lori Lam is a 28 y.o. [phone] female who presents for a postpartum visit. She is 6 weeks 6 days postpartum following a normal spontaneous vaginal delivery.  I have fully reviewed the prenatal and intrapartum course. The delivery was at 39 gestational weeks.  Anesthesia: epidural. Postpartum course has been okay. Baby is doing well but she has been gassy. Baby is feeding by bottle - Similac Total Comfort. Bleeding staining only. Bowel function is normal. Bladder function is normal. Patient is sexually active. Contraception method is condoms. Postpartum depression screening: negative.  The pregnancy intention screening data noted above was reviewed. Potential methods of contraception were discussed. The patient elected to proceed with No data recorded.   Edinburgh Postnatal Depression Scale - 03/13/24 1039       Edinburgh Postnatal Depression Scale:  In the Past 7 Days   I have been able to laugh and see the funny side of things. 0    I have looked forward with enjoyment to things. 0    I have blamed myself unnecessarily when things went wrong. 0    I have been anxious or worried for no good reason. 0    I have felt scared or panicky for no good reason. 0    Things have been getting on top of me. 0    I have been so unhappy that I have had difficulty sleeping. 0    I have felt sad or miserable. 1    I have been so unhappy that I have been crying. 0    The thought of harming myself has occurred to me. 0    Edinburgh Postnatal Depression Scale Total 1          Health Maintenance Due  Topic Date Due   Hepatitis B Vaccines 19-59 Average Risk (1 of 3 - 19+ 3-dose series) Never done   Pneumococcal Vaccine (2 of 2 - PCV) 12/28/2013   HPV VACCINES (1 - 3-dose SCDM series) Never done   COVID-19 Vaccine (1 - 2025-26 season) Never done    The following portions of the patient's history were reviewed and updated as appropriate: allergies, current  medications, past family history, past medical history, past social history, past surgical history, and problem list.  Review of Systems Pertinent items are noted in HPI.  Objective:  BP (!) 161/81 (BP Location: Left Arm, Patient Position: Sitting)   Pulse 67   Wt 118.9 kg   LMP 04/24/2023 (Exact Date)   Breastfeeding No   BMI 42.30 kg/m      03/13/2024   10:33 AM 03/13/2024    9:46 AM 01/27/2024    4:30 AM  Vitals with BMI  Weight  262 lbs 2 oz   Systolic 161 148 880  Diastolic 81 89 60  Pulse 67 64 65    General:  alert and cooperative   Breasts:  normal  Lungs:  Normal respiratory effort  Heart:  Normal heart rate noted  Abdomen:  Soft, non-tender   GU exam:  Exam deferred       Assessment:   1. Routine postpartum follow-up (Primary)  2. Postpartum anxiety - Discussed   Start Zoloft 25mg    Normal postpartum exam.   Plan:   Essential components of care per ACOG recommendations:  1.  Mood and well being: Patient with negative depression screening today, but feels more anxious. Reviewed local resources for support.  - Patient tobacco use? No.   - hx  of drug use? No.    2. Infant care and feeding:  -Patient currently breastmilk feeding? No.   3. Sexuality, contraception and birth spacing - Reviewed reproductive life planning. Reviewed contraceptive methods based on pt preferences and effectiveness.  Patient desired Contraceptive Patch today but with increased BP's choosing to use condoms instead.   - Discussed birth spacing of 18 months  4. Sleep and fatigue -Encouraged family/partner/community support of 4 hrs of uninterrupted sleep to help with mood and fatigue  5. Physical Recovery  - Discussed patients delivery and complications. She describes her labor as mixed. She did not have the best experience with her epidural. She states I felt everything when it was being placed. - Patient had a Vaginal, no problems at delivery. Patient had no  laceration.  Perineal healing reviewed. Patient expressed understanding - Patient has urinary incontinence? No. - Patient is safe to resume physical and sexual activity  6.  Health Maintenance - HM due items addressed Yes - Last pap smear Diagnosis  Date Value Ref Range Status  05/18/2021   Final   - Negative for intraepithelial lesion or malignancy (NILM)   Pap smear not done at today's visit.  -Breast Cancer screening indicated? No.   7. Chronic Disease/Pregnancy Condition follow up: None  - PCP follow up  Tommy Daring, [facility] [employer], [facility]

## 2024-03-13 NOTE — Progress Notes (Signed)
See student note

## 2024-05-02 ENCOUNTER — Ambulatory Visit
Admission: EM | Admit: 2024-05-02 | Discharge: 2024-05-02 | Disposition: A | Discharge status: Home / Self Care | Payer: MEDICAID | Source: Home / Self Care

## 2024-05-02 ENCOUNTER — Encounter: Payer: Self-pay | Admitting: Emergency Medicine

## 2024-05-02 DIAGNOSIS — B9689 Other specified bacterial agents as the cause of diseases classified elsewhere: Secondary | ICD-10-CM

## 2024-05-02 DIAGNOSIS — H109 Unspecified conjunctivitis: Secondary | ICD-10-CM

## 2024-05-02 MED ORDER — CIPROFLOXACIN HCL 0.3 % OP SOLN: 1.0000 [drp] | OPHTHALMIC | 0 refills | Status: AC

## 2024-05-02 NOTE — ED Triage Notes (Signed)
 Pt presents c/o bilateral conjunctivitis and L otalgia. Pt states,  I think I got pink eye and a ear infection. My eye been pink for 3 days and my ear started hurting yesterday. I put my breast milk in my eye to see if that would help and it did a little but not a lot.

## 2024-05-02 NOTE — ED Provider Notes (Signed)
 " [name]    CSN: [id number] Arrival date & time: 05/02/24  1247      History   Chief Complaint Chief Complaint  Patient presents with   Conjunctivitis    Both eyes    Otalgia    L ear     HPI Lori Lam is a 29 y.o. female.   Pt presents today due to 3 days worth of red right eye with purulent drainage that is starting to spread to left. Pt states that she has attempted to use her breast milk in her eye with mild relief. Pt also report left ear pain.   The history is provided by the patient.  Conjunctivitis  Otalgia   Past Medical History:  Diagnosis Date   Anemia    Anxiety    Asthma    Bacterial vaginitis    Chlamydia contact, treated    GERD (gastroesophageal reflux disease)    H/O chlamydia infection 12/28/2012   Treated again 08/2015 Negative 08/30/16   Headache    Mild concussion 12/21/2017    Patient Active Problem List   Diagnosis Date Noted   Postpartum anxiety 03/13/2024   History of prior pregnancy with intrauterine growth restricted newborn 09/05/2023   Asthma 12/28/2012    Past Surgical History:  Procedure Laterality Date   HERNIA REPAIR     INSERTION OF IMPLANON  ROD  12/08/2021    OB History     Gravida  5   Para  4   Term  4   Preterm  0   AB  1   Living  4      SAB  0   IAB  1   Ectopic  0   Multiple  0   Live Births  4            Home Medications    Prior to Admission medications  Medication Sig Start Date End Date Taking? Authorizing Provider  ASPIRIN  ADULT LOW DOSE 81 MG tablet Take 81 mg by mouth daily. 04/09/24  Yes [provider]  ciprofloxacin (CILOXAN) 0.3 % ophthalmic solution Place 1 drop into both eyes every 2 (two) hours for 7 days. Administer 1 drop, every 2 hours, while awake, for 2 days. Then 1 drop, every 4 hours, while awake, for the next 5 days. 05/02/24 05/09/24 Yes Andra Corean BROCKS, PA-C  acetaminophen  (TYLENOL ) 325 MG tablet Take 2 tablets (650 mg total) by  mouth every 4 (four) hours as needed (for pain scale < 4). 01/27/24   Cashion, Colter L, MD  albuterol  (VENTOLIN  HFA) 108 (90 Base) MCG/ACT inhaler Inhale 2 puffs into the lungs every 6 (six) hours as needed for wheezing or shortness of breath. 02/24/23   Fleming, Zelda W, NP  amLODipine  (NORVASC ) 5 MG tablet Take 1 tablet (5 mg total) by mouth daily. 03/13/24   Vannie Cornell SAUNDERS, CNM  cyclobenzaprine  (FLEXERIL ) 10 MG tablet Take 1 tablet (10 mg total) by mouth at bedtime. Patient not taking: Reported on 03/13/2024 01/10/24   Vannie Cornell SAUNDERS, CNM  fluticasone  (FLONASE ) 50 MCG/ACT nasal spray Place 2 sprays into both nostrils daily. 08/07/23   Leftwich-Kirby, Olam DELENA, CNM  loratadine  (CLARITIN ) 10 MG tablet Take 1 tablet (10 mg total) by mouth daily. 08/07/23   Leftwich-Kirby, Olam DELENA, CNM  Prenatal Vit-Fe Fumarate-FA (MULTIVITAMIN-PRENATAL) 27-0.8 MG TABS tablet Take 1 tablet by mouth daily at 12 noon.    [provider]  senna-docusate (SENOKOT-S) 8.6-50 MG tablet Take 2 tablets  by mouth daily. Patient not taking: Reported on 03/13/2024 01/27/24   Cashion, Colter L, MD  sertraline  (ZOLOFT ) 25 MG tablet Take 1 tablet (25 mg total) by mouth daily. 03/13/24   Vannie Cornell SAUNDERS, CNM    Family History Family History  Problem Relation Age of Onset   Hypertension Mother    Hypertension Father    Hypertension Maternal Grandmother    Hypertension Maternal Grandfather    Hypertension Paternal Grandmother    Hypertension Other    Diabetes Other     Social History Social History[1]   Allergies   Patient has no known allergies.   Review of Systems Review of Systems  HENT:  Positive for ear pain.      Physical Exam Triage Vital Signs ED Triage Vitals  Encounter Vitals Group     BP 05/02/24 1319 107/76     Girls Systolic BP Percentile --      Girls Diastolic BP Percentile --      Boys Systolic BP Percentile --      Boys Diastolic BP Percentile --      Pulse Rate 05/02/24 1319 62      Resp 05/02/24 1319 18     Temp 05/02/24 1319 98.2 F (36.8 C)     Temp Source 05/02/24 1319 Oral     SpO2 05/02/24 1319 98 %     Weight 05/02/24 1318 262 lb 2 oz (118.9 kg)     Height --      Head Circumference --      Peak Flow --      Pain Score 05/02/24 1316 5     Pain Loc --      Pain Education --      Exclude from Growth Chart --    No data found.  Updated Vital Signs BP 107/76 (BP Location: Left Arm)   Pulse 62   Temp 98.2 F (36.8 C) (Oral)   Resp 18   Wt 262 lb 2 oz (118.9 kg)   LMP 04/07/2024 (Approximate)   SpO2 98%   Breastfeeding Yes   BMI 42.31 kg/m   Visual Acuity Right Eye Distance:   Left Eye Distance:   Bilateral Distance:    Right Eye Near:   Left Eye Near:    Bilateral Near:     Physical Exam Vitals and nursing note reviewed.  Constitutional:      General: She is not in acute distress.    Appearance: Normal appearance. She is not ill-appearing, toxic-appearing or diaphoretic.  HENT:     Right Ear: Tympanic membrane, ear canal and external ear normal.     Left Ear: Tympanic membrane, ear canal and external ear normal.     Mouth/Throat:     Mouth: Mucous membranes are moist.     Pharynx: Oropharynx is clear. No oropharyngeal exudate or posterior oropharyngeal erythema.  Eyes:     General: No scleral icterus.       Right eye: No discharge.        Left eye: No discharge.     Extraocular Movements: Extraocular movements intact.     Pupils: Pupils are equal, round, and reactive to light.     Comments: Moderate erythema noted of right palpebral conjunctiva  Cardiovascular:     Rate and Rhythm: Normal rate and regular rhythm.     Heart sounds: Normal heart sounds.  Pulmonary:     Effort: Pulmonary effort is normal.     Breath sounds: Normal breath sounds.  Skin:    General: Skin is warm.  Neurological:     General: No focal deficit present.     Mental Status: She is alert.  Psychiatric:        Mood and Affect: Mood normal.         Behavior: Behavior normal.      UC Treatments / Results  Labs (all labs ordered are listed, but only abnormal results are displayed) Labs Reviewed - No data to display  EKG   Radiology No results found.  Procedures Procedures (including critical care time)  Medications Ordered in UC Medications - No data to display  Initial Impression / Assessment and Plan / UC Course  I have reviewed the triage vital signs and the nursing notes.  Pertinent labs & imaging results that were available during my care of the patient were reviewed by me and considered in my medical decision making (see chart for details).     Final Clinical Impressions(s) / UC Diagnoses   Final diagnoses:  Bacterial conjunctivitis of both eyes     Discharge Instructions      You have been diagnosed with bacterial conjunctivitis or pinkeye today.  You are contagious until you take eyedrops for 24 hours.  Practice good hand hygiene, use soap and water.  Be sure not to share towels and washcloths, change them daily.  Symptoms should improve in 3 days, but use eyedrops for 7.  If you experience changes in vision you need to follow-up with ophthalmologist as soon as possible.      ED Prescriptions     Medication Sig Dispense Auth. Provider   ciprofloxacin (CILOXAN) 0.3 % ophthalmic solution Place 1 drop into both eyes every 2 (two) hours for 7 days. Administer 1 drop, every 2 hours, while awake, for 2 days. Then 1 drop, every 4 hours, while awake, for the next 5 days. 4.2 mL Andra Corean BROCKS, PA-C      PDMP not reviewed this encounter.    [1]  Social History Tobacco Use   Smoking status: Former    Current packs/day: 0.00    Average packs/day: 0.3 packs/day    Types: Cigarettes    Quit date: 03/2021    Years since quitting: 3.1    Passive exposure: Past   Smokeless tobacco: Never   Tobacco comments:    2 cigs/day  Vaping Use   Vaping status: Never Used  Substance Use Topics   Alcohol  use: Not Currently    Comment: socially, not since confirmed pregnancy   Drug use: No     Andra Corean BROCKS, PA-C 05/02/24 1331  "

## 2024-05-02 NOTE — Discharge Instructions (Signed)
 You have been diagnosed with bacterial conjunctivitis or pinkeye today.  You are contagious until you take eyedrops for 24 hours.  Practice good hand hygiene, use soap and water .  Be sure not to share towels and washcloths, change them daily.  Symptoms should improve in 3 days, but use eyedrops for 7.  If you experience changes in vision you need to follow-up with ophthalmologist as soon as possible.

## 2024-05-29 ENCOUNTER — Other Ambulatory Visit (HOSPITAL_COMMUNITY)
Admission: RE | Admit: 2024-05-29 | Discharge: 2024-05-29 | Disposition: A | Payer: MEDICAID | Source: Ambulatory Visit | Attending: Certified Nurse Midwife | Admitting: Certified Nurse Midwife

## 2024-05-29 ENCOUNTER — Ambulatory Visit: Payer: MEDICAID | Admitting: Certified Nurse Midwife

## 2024-05-29 ENCOUNTER — Other Ambulatory Visit: Payer: Self-pay

## 2024-05-29 VITALS — BP 111/91 | HR 61 | Wt 266.8 lb

## 2024-05-29 DIAGNOSIS — O165 Unspecified maternal hypertension, complicating the puerperium: Secondary | ICD-10-CM

## 2024-05-29 DIAGNOSIS — Z01419 Encounter for gynecological examination (general) (routine) without abnormal findings: Secondary | ICD-10-CM | POA: Insufficient documentation

## 2024-05-29 DIAGNOSIS — Z124 Encounter for screening for malignant neoplasm of cervix: Secondary | ICD-10-CM | POA: Insufficient documentation

## 2024-05-29 DIAGNOSIS — F419 Anxiety disorder, unspecified: Secondary | ICD-10-CM

## 2024-05-29 DIAGNOSIS — O99345 Other mental disorders complicating the puerperium: Secondary | ICD-10-CM

## 2024-05-29 DIAGNOSIS — I10 Essential (primary) hypertension: Secondary | ICD-10-CM

## 2024-05-29 DIAGNOSIS — Z6841 Body Mass Index (BMI) 40.0 and over, adult: Secondary | ICD-10-CM | POA: Diagnosis not present

## 2024-05-29 MED ORDER — AMLODIPINE BESYLATE 5 MG PO TABS: 5.0000 mg | ORAL_TABLET | Freq: Two times a day (BID) | ORAL | 6 refills | Status: AC

## 2024-05-29 MED ORDER — SERTRALINE HCL 50 MG PO TABS: 50.0000 mg | ORAL_TABLET | Freq: Every day | ORAL | 6 refills | Status: AC

## 2024-05-29 NOTE — Progress Notes (Signed)
 "  ANNUAL EXAM Patient name: Lori Lam MRN [id number]  Date of birth: 12-29-1995 Chief Complaint:   Gynecologic Exam  History of Present Illness:   Lori Lam is a 29 y.o. [phone] African-American female being seen today for a routine annual exam.  Current complaints: Has maintained a higher weight than she likes, still taking norvasc  and doing well on zoloft .  Patient's last menstrual period was 05/05/2023 (exact date).   Upstream - 06/04/24 1232       Pregnancy Intention Screening   Does the patient want to become pregnant in the next year? No    Does the patient's partner want to become pregnant in the next year? No    Would the patient like to discuss contraceptive options today? Yes      Contraception Wrap Up   Current Method Female Condom    End Method Hormonal Implant    Contraception Counseling Provided Yes    How was the end contraceptive method provided? Provided on site   Rescheduled for placement next week        The pregnancy intention screening data noted above was reviewed. Potential methods of contraception were discussed. The patient elected to proceed with Hormonal Implant.   Last pap 05/18/21. Results were: NILM w/ HRHPV negative. H/O abnormal pap: no Last mammogram: Never (age). Results were: N/A. Family h/o breast cancer: no Last colonoscopy: Never (age). Results were: N/A. Family h/o colorectal cancer: no     07/10/2023    9:23 AM 06/02/2023   10:10 AM 02/24/2023    9:12 AM 12/06/2022    8:57 AM 11/01/2021    2:21 PM  Depression screen PHQ 2/9  Decreased Interest 0 0 0 0 0  Down, Depressed, Hopeless 0 0 0 0 0  PHQ - 2 Score 0 0 0 0 0  Altered sleeping 0   0 0  Tired, decreased energy 0   0 0  Change in appetite 0   0 0  Feeling bad or failure about yourself  0   0 0  Trouble concentrating 0   0 0  Moving slowly or fidgety/restless 0   0 0  Suicidal thoughts 0   0 0  PHQ-9 Score 0    0  0      Data saved with a previous flowsheet row  definition        07/10/2023    9:23 AM 12/06/2022    8:57 AM 09/23/2021   10:15 AM 09/06/2021   10:01 AM  GAD 7 : Generalized Anxiety Score  Nervous, Anxious, on Edge 0  0  0  0   Control/stop worrying 0  0  0  1   Worry too much - different things 0  0  1  1   Trouble relaxing 0  0  0  1   Restless 0  0  0  0   Easily annoyed or irritable 0  0  1  0   Afraid - awful might happen 0  0  0  0   Total GAD 7 Score 0 0 2 3     Data saved with a previous flowsheet row definition     Review of Systems:   Pertinent items are noted in HPI Denies any headaches, blurred vision, fatigue, shortness of breath, chest pain, abdominal pain, abnormal vaginal discharge/itching/odor/irritation, problems with periods, bowel movements, urination, or intercourse unless otherwise stated above.  Pertinent History Reviewed:  Reviewed past medical,surgical, social  and family history.  Reviewed problem list, medications and allergies.  Physical Assessment:   Vitals:   05/29/24 1137 05/29/24 1216  BP: (!) 150/91 (!) 111/91  Pulse: 61   Weight: 266 lb 12.8 oz (121 kg)    Body mass index is 43.06 kg/m.   Physical Examination:  General appearance - well appearing, and in no distress Mental status - alert, oriented to person, place, and time Psych:  She has a normal mood and affect Skin - warm and dry, normal color, no suspicious lesions noted Chest - effort normal, no problems with respiration noted Heart - normal rate and regular rhythm Neck:  midline trachea, no thyromegaly or nodules Breasts - breasts appear normal, no suspicious masses, no skin or nipple changes or  axillary nodes Abdomen - soft, nontender, nondistended, no masses or organomegaly Pelvic - VULVA: normal appearing vulva with no masses, tenderness or lesions   VAGINA: normal appearing vagina with normal color and discharge, no lesions   CERVIX: normal appearing cervix without discharge or lesions, no CMT Thin prep pap is done with  HR HPV cotesting Extremities:  No swelling or varicosities noted  Chaperone present for exam  No results found for this or any previous visit (from the past 24 hours).  Assessment & Plan:  1. Encounter for annual routine gynecological examination (Primary) - Doing well overall, wants to discuss weight loss options - Mammogram: @ 29yo, or sooner if problems - Colonoscopy: @ 29yo, or sooner if problems 2. Anxiety - Increased her dose and is doing well on 50mg  daily - sertraline  (ZOLOFT ) 50 MG tablet; Take 1 tablet (50 mg total) by mouth daily.  Dispense: 60 tablet; Refill: 6  3. Chronic hypertension - amLODipine  (NORVASC ) 5 MG tablet; Take 1 tablet (5 mg total) by mouth 2 (two) times daily at 8 am and 10 pm.  Dispense: 60 tablet; Refill: 6  4. Papanicolaou smear for cervical cancer screening - Cytology - PAP( [place]) - Will follow up results of pap smear and manage accordingly.  5. BMI 40.0-44.9, adult (HCC) - Requested weight loss meds, explained that we do not have the appt availability to manage that here but offered referral to [facility] which she accepted. - Amb Ref to Medical Weight Management  - Routine preventative health maintenance measures emphasized.  Orders Placed This Encounter  Procedures   Amb Ref to Medical Weight Management   Meds:  Meds ordered this encounter  Medications   sertraline  (ZOLOFT ) 50 MG tablet    Sig: Take 1 tablet (50 mg total) by mouth daily.    Dispense:  60 tablet    Refill:  6   amLODipine  (NORVASC ) 5 MG tablet    Sig: Take 1 tablet (5 mg total) by mouth 2 (two) times daily at 8 am and 10 pm.    Dispense:  60 tablet    Refill:  6   Follow-up: Return in about 1 week (around 06/05/2024) for Nexplanon .  Cornell JONELLE Finder, CNM 06/04/2024 12:33 PM "

## 2024-05-30 LAB — CYTOLOGY - PAP
Adequacy: ABSENT
Comment: NEGATIVE
Diagnosis: NEGATIVE
High risk HPV: NEGATIVE

## 2024-06-05 ENCOUNTER — Encounter (INDEPENDENT_AMBULATORY_CARE_PROVIDER_SITE_OTHER): Payer: Self-pay

## 2024-06-05 ENCOUNTER — Encounter: Payer: Self-pay | Admitting: Certified Nurse Midwife

## 2024-06-05 ENCOUNTER — Other Ambulatory Visit: Payer: Self-pay

## 2024-06-05 ENCOUNTER — Ambulatory Visit: Payer: Self-pay | Admitting: Certified Nurse Midwife

## 2024-06-05 VITALS — BP 131/89 | HR 73 | Wt 276.6 lb

## 2024-06-05 DIAGNOSIS — Z975 Presence of (intrauterine) contraceptive device: Secondary | ICD-10-CM | POA: Insufficient documentation

## 2024-06-05 DIAGNOSIS — Z30017 Encounter for initial prescription of implantable subdermal contraceptive: Secondary | ICD-10-CM

## 2024-06-05 LAB — POCT PREGNANCY, URINE: Preg Test, Ur: NEGATIVE

## 2024-06-05 MED ORDER — ETONOGESTREL 68 MG ~~LOC~~ IMPL
68.0000 mg | DRUG_IMPLANT | Freq: Once | SUBCUTANEOUS | Status: AC
  Administered 2024-06-05: 68 mg via SUBCUTANEOUS

## 2024-06-05 NOTE — Addendum Note (Signed)
 Addended by: ELBY WADDELL CROME on: 06/05/2024 11:02 AM   Modules accepted: Orders

## 2024-06-05 NOTE — Progress Notes (Signed)
 GYNECOLOGY PROCEDURE NOTE  Lori Lam is a 29 y.o. [id number] Was seen last week for annual exam where they discussed contraception options. Requesting Nexplanon  insertion today. Negative UPT. No gynecologic concerns.  Nexplanon  Insertion Procedure Patient identified, informed consent performed, consent signed. Patient does understand that irregular bleeding is a very common side effect of this medication. She was advised to have backup contraception for one week after placement. Appropriate time out taken. Patient's right arm was prepped and draped in the usual sterile fashion. The insertion area was measured and marked. Patient was prepped with alcohol swab and then injected with 2 ml of 1% lidocaine . The area was then prepped with betadine. Nexplanon  removed from packaging and device confirmed present within needle, then inserted full length of needle and withdrawn per handbook instructions. Nexplanon  was able to palpated in the patient's arm; patient palpated the insert herself. There was minimal blood loss. Patient insertion site covered with steri strip, guaze, and a pressure bandage to reduce any bruising. The patient tolerated the procedure well and was given post procedure instructions.
